# Patient Record
Sex: Female | Born: 1940 | Race: Black or African American | Hispanic: No | Marital: Married
Health system: Midwestern US, Community
[De-identification: ages and names within clinical notes are randomized; demographics above are authoritative.]

## PROBLEM LIST (undated history)

## (undated) DIAGNOSIS — F32A Depression, unspecified: Secondary | ICD-10-CM

## (undated) DIAGNOSIS — E119 Type 2 diabetes mellitus without complications: Secondary | ICD-10-CM

## (undated) DIAGNOSIS — E079 Disorder of thyroid, unspecified: Secondary | ICD-10-CM

## (undated) DIAGNOSIS — D649 Anemia, unspecified: Secondary | ICD-10-CM

## (undated) DIAGNOSIS — F419 Anxiety disorder, unspecified: Secondary | ICD-10-CM

## (undated) DIAGNOSIS — T7840XA Allergy, unspecified, initial encounter: Secondary | ICD-10-CM

## (undated) DIAGNOSIS — M109 Gout, unspecified: Secondary | ICD-10-CM

## (undated) DIAGNOSIS — I10 Essential (primary) hypertension: Secondary | ICD-10-CM

## (undated) DIAGNOSIS — K219 Gastro-esophageal reflux disease without esophagitis: Secondary | ICD-10-CM

## (undated) DIAGNOSIS — M1990 Unspecified osteoarthritis, unspecified site: Secondary | ICD-10-CM

## (undated) DIAGNOSIS — IMO0001 Reserved for inherently not codable concepts without codable children: Secondary | ICD-10-CM

## (undated) DIAGNOSIS — F411 Generalized anxiety disorder: Secondary | ICD-10-CM

## (undated) DIAGNOSIS — E1165 Type 2 diabetes mellitus with hyperglycemia: Secondary | ICD-10-CM

## (undated) HISTORY — DX: Allergy, unspecified, initial encounter: T78.40XA

## (undated) HISTORY — DX: Gastro-esophageal reflux disease without esophagitis: K21.9

## (undated) HISTORY — PX: TUBAL LIGATION: SHX77

## (undated) HISTORY — DX: Anxiety disorder, unspecified: F41.9

## (undated) HISTORY — DX: Depression, unspecified: F32.A

## (undated) HISTORY — DX: Type 2 diabetes mellitus without complications: E11.9

## (undated) HISTORY — PX: BREAST EXCISIONAL BIOPSY: SUR124

## (undated) HISTORY — PX: HERNIA REPAIR: SHX51

## (undated) HISTORY — DX: Anemia, unspecified: D64.9

## (undated) HISTORY — DX: Unspecified osteoarthritis, unspecified site: M19.90

## (undated) HISTORY — PX: ABDOMINAL HYSTERECTOMY: SHX81

## (undated) HISTORY — PX: BREAST SURGERY: SHX581

## (undated) HISTORY — DX: Disorder of thyroid, unspecified: E07.9

## (undated) HISTORY — DX: Essential (primary) hypertension: I10

---

## 2008-10-23 DIAGNOSIS — R0789 Other chest pain: Secondary | ICD-10-CM | POA: Insufficient documentation

## 2009-05-18 LAB — METABOLIC PANEL, COMPREHENSIVE
A-G Ratio: 0.8 — ABNORMAL LOW (ref 1.1–2.2)
ALT (SGPT): 25 U/L (ref 12–78)
AST (SGOT): 26 U/L (ref 15–37)
Albumin: 3.5 g/dL (ref 3.5–5.0)
Alk. phosphatase: 56 U/L (ref 50–136)
Anion gap: 11 mmol/L (ref 5–15)
BUN/Creatinine ratio: 12 (ref 12–20)
BUN: 13 MG/DL (ref 6–20)
Bilirubin, total: 0.4 MG/DL (ref 0.2–1.0)
CO2: 25 MMOL/L (ref 21–32)
Calcium: 8.9 MG/DL (ref 8.5–10.1)
Chloride: 101 MMOL/L (ref 97–108)
Creatinine: 1.1 MG/DL (ref 0.6–1.3)
GFR est AA: >60 mL/min/{1.73_m2} (ref >60)
GFR est non-AA: 53 mL/min/{1.73_m2} — ABNORMAL LOW (ref >60)
Globulin: 4.2 g/dL — ABNORMAL HIGH (ref 2.0–4.0)
Glucose: 125 MG/DL — ABNORMAL HIGH (ref 65–100)
Potassium: 3.5 MMOL/L (ref 3.5–5.1)
Protein, total: 7.7 g/dL (ref 6.4–8.2)
Sodium: 137 MMOL/L (ref 136–145)

## 2009-05-18 LAB — URINALYSIS W/ REFLEX CULTURE
Bacteria: NEGATIVE /HPF
Bilirubin: NEGATIVE
Blood: NEGATIVE
Glucose: NEGATIVE MG/DL
Ketone: NEGATIVE MG/DL
Nitrites: NEGATIVE
Protein: NEGATIVE MG/DL
Specific gravity: 1.018 (ref 1.003–1.030)
Urobilinogen: 0.2 EU/DL (ref 0.2–1.0)
pH (UA): 6.5 (ref 5.0–8.0)

## 2009-05-18 LAB — PROTHROMBIN TIME + INR
INR: 1 (ref 0.9–1.1)
Prothrombin time: 10.6 SECS (ref 9.0–11.0)

## 2009-05-18 LAB — PTT: aPTT: 46.6 s — ABNORMAL HIGH (ref 24.0–33.0)

## 2009-05-18 LAB — CBC WITH AUTOMATED DIFF
ABS. BASOPHILS: 0 10*3/uL (ref 0.0–0.1)
ABS. EOSINOPHILS: 0.1 10*3/uL (ref 0.0–0.4)
ABS. LYMPHOCYTES: 2 10*3/uL (ref 0.8–3.5)
ABS. MONOCYTES: 0.3 10*3/uL (ref 0.0–1.0)
ABS. NEUTROPHILS: 1.6 10*3/uL — ABNORMAL LOW (ref 1.8–8.0)
BASOPHILS: 0 % (ref 0–1)
EOSINOPHILS: 3 % (ref 0–7)
HCT: 42.1 % (ref 35.0–47.0)
HGB: 14 g/dL (ref 11.5–16.0)
LYMPHOCYTES: 50 % — ABNORMAL HIGH (ref 12–49)
MCH: 24.9 PG — ABNORMAL LOW (ref 26.0–34.0)
MCHC: 33.3 g/dL (ref 30.0–36.5)
MCV: 74.8 FL — ABNORMAL LOW (ref 80.0–99.0)
MONOCYTES: 7 % (ref 5–13)
NEUTROPHILS: 40 % (ref 32–75)
PLATELET: 263 10*3/uL (ref 150–400)
RBC: 5.63 M/uL — ABNORMAL HIGH (ref 3.80–5.20)
RDW: 15.9 % — ABNORMAL HIGH (ref 11.5–14.5)
WBC: 4 10*3/uL (ref 3.6–11.0)

## 2009-05-18 LAB — CK W/ REFLX CKMB: CK: 202 U/L (ref 21–215)

## 2009-05-18 LAB — TROPONIN I: Troponin-I, Qt.: <0.04 ng/mL (ref <0.05)

## 2009-05-19 LAB — CULTURE, URINE
Colonies Counted: <1000
Colony Count: <1000
Culture result:: NO GROWTH
Culture: NO GROWTH

## 2013-04-03 ENCOUNTER — Encounter

## 2020-03-05 ENCOUNTER — Ambulatory Visit: Admit: 2020-03-05 | Discharge: 2020-03-05 | Payer: MEDICARE | Attending: Family | Primary: Family

## 2020-03-05 ENCOUNTER — Ambulatory Visit: Attending: Family | Primary: Family

## 2020-03-05 DIAGNOSIS — Z0000 Encounter for general adult medical examination without abnormal findings: Secondary | ICD-10-CM

## 2020-03-05 MED ORDER — OMEPRAZOLE 20 MG CAP, DELAYED RELEASE
20 mg | ORAL_CAPSULE | Freq: Every day | ORAL | 1 refills | Status: DC
Start: 2020-03-05 — End: 2020-03-09

## 2020-03-05 MED ORDER — CLONIDINE 0.2 MG TAB
0.2 mg | ORAL_TABLET | Freq: Two times a day (BID) | ORAL | 1 refills | Status: DC
Start: 2020-03-05 — End: 2020-03-09

## 2020-03-05 MED ORDER — GLIPIZIDE 5 MG TAB
5 mg | ORAL_TABLET | Freq: Two times a day (BID) | ORAL | 1 refills | Status: DC
Start: 2020-03-05 — End: 2020-03-09

## 2020-03-05 NOTE — Addendum Note (Signed)
Addended by: Angelita Ingles on: 03/24/2020 04:55 PM     Modules accepted: Orders

## 2020-03-05 NOTE — Progress Notes (Addendum)
Diana Shaw is a 78 y.o. female    Chief Complaint   Patient presents with   ??? New Patient   ??? Establish Care   ??? Medication Refill     1. Have you been to the ER, urgent care clinic since your last visit?  Hospitalized since your last visit?No    2. Have you seen or consulted any other health care providers outside of the [facility] since your last visit?  Include any pap smears or colon screening. No

## 2020-03-05 NOTE — Patient Instructions (Signed)
Medicare Wellness Visit, Female     The best way to live healthy is to have a lifestyle where you eat a well-balanced diet, exercise regularly, limit alcohol use, and quit all forms of tobacco/nicotine, if applicable.     Regular preventive services are another way to keep healthy. Preventive services (vaccines, screening tests, monitoring & exams) can help personalize your care plan, which helps you manage your own care. Screening tests can find health problems at the earliest stages, when they are easiest to treat.   [facility] follows the current, evidence-based guidelines published by the [place] [employer] (USPSTF) when recommending preventive services for our patients. Because we follow these guidelines, sometimes recommendations change over time as research supports it. (For example, mammograms used to be recommended annually. Even though Medicare will still pay for an annual mammogram, the newer guidelines recommend a mammogram every two years for women of average risk).  Of course, you and your doctor may decide to screen more often for some diseases, based on your risk and your co-morbidities (chronic disease you are already diagnosed with).     Preventive services for you include:  - Medicare offers their members a free annual wellness visit, which is time for you and your primary care provider to discuss and plan for your preventive service needs. Take advantage of this benefit every year!  -All adults over the age of 58 should receive the recommended pneumonia vaccines. Current USPSTF guidelines recommend a series of two vaccines for the best pneumonia protection.   -All adults should have a flu vaccine yearly and a tetanus vaccine every 10 years.   -All adults age 28 and older should receive the shingles vaccines (series of two vaccines).      -All adults age 55-70 who are overweight should have a diabetes screening test once every three years.   -All  adults born between [birth year] and 1965 should be screened once for Hepatitis C.  -Other screening tests and preventive services for persons with diabetes include: an eye exam to screen for diabetic retinopathy, a kidney function test, a foot exam, and stricter control over your cholesterol.   -Cardiovascular screening for adults with routine risk involves an electrocardiogram (ECG) at intervals determined by your doctor.   -Colorectal cancer screenings should be done for adults age 48-75 with no increased risk factors for colorectal cancer.  There are a number of acceptable methods of screening for this type of cancer. Each test has its own benefits and drawbacks. Discuss with your doctor what is most appropriate for you during your annual wellness visit. The different tests include: colonoscopy (considered the best screening method), a fecal occult blood test, a fecal DNA test, and sigmoidoscopy.    -A bone mass density test is recommended when a woman turns 65 to screen for osteoporosis. This test is only recommended one time, as a screening. Some providers will use this same test as a disease monitoring tool if you already have osteoporosis.  -Breast cancer screenings are recommended every other year for women of normal risk, age 22-74.  -Cervical cancer screenings for women over age 42 are only recommended with certain risk factors.     Here is a list of your current Health Maintenance items (your personalized list of preventive services) with a due date:  Health Maintenance Due   Topic Date Due   ??? Hepatitis C Test  Never done   ??? COVID-19 Vaccine (1) Never done   ???  DTaP/Tdap/Td  (1 - Tdap) Never done   ??? Shingles Vaccine (1 of 2) Never done   ??? Bone Mineral Density   Never done   ??? Pneumococcal Vaccine (1 of 1 - PPSV23) Never done

## 2020-03-05 NOTE — Addendum Note (Signed)
Addendum Note by Angelita Ingles, NP at 03/05/20 1030                Author: Angelita Ingles, NP  Service: --  Author Type: Nurse Practitioner       Filed: 03/24/20 1655  Encounter Date: 03/05/2020  Status: Signed          Editor: Angelita Ingles, NP (Nurse Practitioner)          Addended by: Angelita Ingles on: 03/24/2020 04:55 PM    Modules accepted: Orders

## 2020-03-05 NOTE — Addendum Note (Signed)
Addendum Note  by Marianna Fuss at 03/05/20 1030                Author: Marianna Fuss  Service: --  Author Type: Technician       Filed: 03/19/20 1339  Encounter Date: 03/05/2020  Status: Signed          Editor: Marianna Fuss (Technician)          Addended by: Marianna Fuss on: 03/19/2020 01:39 PM    Modules accepted: Orders

## 2020-03-05 NOTE — Progress Notes (Signed)
This is the Subsequent Medicare Annual Wellness Exam, performed 12 months or more after the Initial AWV or the last Subsequent AWV    I have reviewed the patient's medical history in detail and updated the computerized patient record.                Depression Risk Factor Screening     3 most recent PHQ Screens 03/05/2020   Little interest or pleasure in doing things Not at all   Feeling down, depressed, irritable, or hopeless Not at all   Total Score PHQ 2 0       Alcohol Risk Screen    Do you average more than 1 drink per night or more than 7 drinks a week:  No    On any one occasion in the past three months have you have had more than 3 drinks containing alcohol:  No        Functional Ability and Level of Safety    Hearing: Hearing is good.      Activities of Daily Living:  The home contains: handrails  Patient does total self care      Ambulation: with no difficulty     Fall Risk:  Fall Risk Assessment, last 12 mths 03/05/2020   Able to walk? Yes   Fall in past 12 months? 0   Do you feel unsteady? 0   Are you worried about falling 0      Abuse Screen:  Patient is not abused       Cognitive Screening    Has your family/caregiver stated any concerns about your memory: no     Cognitive Screening: normal            Patient Care Team   Patient Care Team:  Angelica Pou, NP as PCP - General (Nurse Practitioner)  Angelica Pou, NP as PCP - [facility] Empaneled Provider    History   There is no problem list on file for this patient.    Past Medical History:   Diagnosis Date   ??? IUD (intrauterine device) in place       History reviewed. No pertinent surgical history.  Current Outpatient Medications   Medication Sig Dispense Refill   ??? pioglitazone (ACTOS) 30 mg tablet TAKE 1 TABLET BY MOUTH EVERY DAY WITH THE OTHER SUGAR LOWERING DRUGS     ??? valsartan-hydroCHLOROthiazide (DIOVAN-HCT) 160-12.5 mg per tablet TAKE 1 TABLET BY MOUTH EVERY DAY     ??? Januvia 100 mg tablet TAKE 1 TABLET BY MOUTH ONCE A DAY TO LOWER A1C     ???  LORazepam (ATIVAN) 0.5 mg tablet TAKE 1 TABLET BY MOUTH TWICE A DAY AS NEEDED FOR UNBRIDLED ANXIETY     ??? loratadine (CLARITIN) 10 mg tablet Take 10 mg by mouth daily.     ??? dicyclomine (BENTYL) 20 mg tablet Take 20 mg by mouth every six (6) hours.     ??? cloNIDine HCL (CATAPRES) 0.2 mg tablet Take 1 Tab by mouth two (2) times a day. 180 Tab 1   ??? omeprazole (PRILOSEC) 20 mg capsule Take 1 Cap by mouth daily. 90 Cap 1   ??? glipiZIDE (GLUCOTROL) 5 mg tablet Take 1 Tab by mouth two (2) times daily (with meals). 180 Tab 1     Allergies   Allergen Reactions   ??? Aspirin Palpitations   ??? Morphine Other (comments)     Headache    ??? Nitroglycerin Other (comments)     Headache   ???  Penicillins Swelling       History reviewed. No pertinent family history.  Social History     Tobacco Use   ??? Smoking status: Former Smoker   ??? Smokeless tobacco: Never Used   Substance Use Topics   ??? Alcohol use: Not Currently     Assessment/Plan       1. Medicare annual wellness visit, subsequent  Education and counseling provided:  Are appropriate based on today's review and evaluation  End-of-Life planning (with patient's consent)  Screening Mammography  Bone mass measurement (DEXA)    2. Advanced directives, counseling/discussion  See acp note    3. Encounter for screening mammogram for malignant neoplasm of breast  -     MAM MAMMO BI SCREENING INCL CAD; Future    4. Postmenopausal state  -     DEXA BONE DENSITY STUDY AXIAL; Future      Patient believes health maintenance is up to date- will obtain records [facility] for review to confirm this.    Health Maintenance Due     Health Maintenance Due   Topic Date Due   ??? Hepatitis C Screening  Never done   ??? COVID-19 Vaccine (1) Never done   ??? DTaP/Tdap/Td series (1 - Tdap) Never done   ??? Shingrix Vaccine Age 28> (1 of 2) Never done   ??? Bone Densitometry (Dexa) Screening  Never done   ??? Pneumococcal 65+ years (1 of 1 - PPSV23) Never done     Follow up: next AWV recommended in 12 months.     I have  discussed the diagnosis with the patient and the intended plan as seen in the above orders. The patient has received an after-visit summary and questions were answered concerning future plans. Patient conveyed understanding of the plan at the time of the visit.    Angelita Ingles, NP

## 2020-03-05 NOTE — Progress Notes (Signed)
RBC count is elevated with small cell size and increased variability in cell size. Comparison value on file from 10 years ago is similar. Have you seen a hematologist for this in the past?  Thyroid level is normal.  Electrolytes, liver and kidney function normal with the exception of high blood sugar.   LDL cholesterol is well above goal for patients with diabetes, needs to be less than 70, yours is 179. The 10-year ASCVD risk score Denman George DC Jr., et al., 2013) is: 41.7%. this represents very high risk of having a major cardiovascular event such as a stroke or heart attack in the next 10 years. Current guidelines recommend starting cholesterol-lowering medication; medications in the statin family have been shown to be most effective in lowering the risk. Recommend starting atorvastatin 40 mg daily if you are in agreement.   A1c is 11.0- this represents very poor control of blood sugar over the past 3 months. You have reported to my nurse today that you have had a headache with the increase in glipizide dose I recommended. You are not currently on metformin- have you taken this in the past? If not i'd like to add this to your current medications. If you have not been able to tolerate extended release metformin in the past, there is one more medication (an injectable medication daily or once weekly depending on what is covered by your insurance) we can add before moving to insulin.I'd like you to meet with the diabetes educator for self-management education. Until then, please eliminate sweetened beverages, minimize sweets and junk food, and limit simple carbs to one serving at each meal.   Check your blood sugar every morning before breakfast and bring log to appts. Make a follow up appt in 4 weeks. If we do not see some improvement on home glucose monitoring we will have to take further steps to get better control.

## 2020-03-05 NOTE — Progress Notes (Signed)
Subjective:     Diana Shaw is a 79 y.o. female seen to establish care and for evaluation of diabetes, htn, GERD, and anxiety.     Diabetic Review of Systems - medication compliance: compliant all of the time, diabetic diet compliance: compliant most of the time, home glucose monitoring: is performed sporadically, no log today but reports most recent checks have been above 200, further diabetic ROS: no polyuria or polydipsia, no chest pain, dyspnea or TIA's, no numbness, tingling or pain in extremities, no unusual visual symptoms, no hypoglycemia, no medication side effects noted, reports eye exam up to date, acute symptoms are none. Reports A1C was well above goal at last check a little over 3 months ago, has been poorly controlled for over a year.     Diet and Lifestyle: generally follows a low fat low cholesterol diet, generally follows a low sodium diet, exercises sporadically, nonsmoker  Home BP Monitoring: is not measured at home    Cardiovascular ROS: taking medications as instructed, no medication side effects noted, no TIA's, no chest pain on exertion, no dyspnea on exertion, no swelling of ankles, no orthostatic dizziness or lightheadedness, no orthopnea or paroxysmal nocturnal dyspnea, no palpitations, no intermittent claudication symptoms.     Other symptoms and concerns: reports history of intermittent anxiety, with flares a few times a month. Uses lorazepam as needed for this.    Reports reflux symptoms well-controlled on current dose omeprazole, good compliance, tolerating well without apparent side effects.     There is no problem list on file for this patient.    Allergies   Allergen Reactions   ??? Aspirin Palpitations   ??? Morphine Other (comments)     Headache    ??? Nitroglycerin Other (comments)     Headache   ??? Penicillins Swelling     Past Medical History:   Diagnosis Date   ??? IUD (intrauterine device) in place      History reviewed. No pertinent surgical history.  History reviewed. No  pertinent family history.  Social History     Tobacco Use   ??? Smoking status: Former Smoker   ??? Smokeless tobacco: Never Used   Substance Use Topics   ??? Alcohol use: Not Currently        Lab Results   Component Value Date/Time    WBC 4.0 05/18/2009 08:15 AM    HGB 14.0 05/18/2009 08:15 AM    HCT 42.1 05/18/2009 08:15 AM    PLATELET 263 05/18/2009 08:15 AM    MCV 74.8 (L) 05/18/2009 08:15 AM     Lab Results   Component Value Date/Time    Glucose 125 (H) 05/18/2009 09:11 AM    Creatinine 1.1 05/18/2009 09:11 AM      No results found for: CHOL, CHOLPOCT, HDL, LDL, LDLC, LDLCPOC, LDLCEXT, TRIGL, TGLPOCT, CHHD, CHHDX  Lab Results   Component Value Date/Time    ALT (SGPT) 25 05/18/2009 09:11 AM    Alk. phosphatase 56 05/18/2009 09:11 AM    Bilirubin, total 0.4 05/18/2009 09:11 AM    Albumin 3.5 05/18/2009 09:11 AM    Protein, total 7.7 05/18/2009 09:11 AM    INR 1.0 05/18/2009 09:11 AM    Prothrombin time 10.6 05/18/2009 09:11 AM    PLATELET 263 05/18/2009 08:15 AM     Lab Results   Component Value Date/Time    GFR est non-AA 53 (L) 05/18/2009 09:11 AM    GFR est AA >60 05/18/2009 09:11 AM    Creatinine 1.1  05/18/2009 09:11 AM    BUN 13 05/18/2009 09:11 AM    Sodium 137 05/18/2009 09:11 AM    Potassium 3.5 05/18/2009 09:11 AM    Chloride 101 05/18/2009 09:11 AM    CO2 25 05/18/2009 09:11 AM       Review of Systems  A comprehensive review of systems was negative except for that written in the HPI.    Objective:     Visit Vitals  BP 130/83 (BP 1 Location: Left upper arm, BP Patient Position: Sitting, BP Cuff Size: Adult)   Pulse 80   Temp 98.6 ??F (37 ??C) (Oral)   Resp 16   Ht 5' 5"  (1.651 m)   Wt 217 lb 3.2 oz (98.5 kg)   SpO2 99%   BMI 36.14 kg/m??     Physical Examination: General appearance - alert, well appearing, and in no distress, oriented to person, place, and time and well hydrated  Mental status - normal mood, behavior, speech, dress, motor activity, and thought processes  Eyes - pupils equal and reactive,  extraocular eye movements intact, sclera anicteric  Ears - bilateral TM's and external ear canals normal  Nose - normal and patent, no erythema, discharge or polyps  Mouth - mucous membranes moist, pharynx normal without lesions, tongue normal  Neck - supple, no significant adenopathy, thyroid exam: thyroid is normal in size without nodules or tenderness  Chest - clear to auscultation, no wheezes, rales or rhonchi, symmetric air entry  Heart - normal rate, regular rhythm, normal S1, S2, no murmurs, rubs, clicks or gallops, normal bilateral carotid upstroke without bruits, no JVD  Abdomen - soft, nontender, nondistended, no masses or organomegaly  bowel sounds normal  Neurological - alert, oriented, normal speech, no focal findings or movement disorder noted  Musculoskeletal - no joint tenderness, deformity or swelling, no muscular tenderness noted  Extremities - no pedal edema noted, intact peripheral pulses  Skin - normal coloration and turgor, no rashes, no suspicious skin lesions noted      Assessment/Plan:       Diabetic issues reviewed with her: low cholesterol diet, weight control and daily exercise discussed, home glucose monitoring emphasized, all medications, side effects and compliance discussed carefully, foot care discussed and Podiatry visits discussed, annual eye examinations at Ophthalmology discussed, glycohemoglobin and other lab monitoring discussed and long term diabetic complications discussed.   Diagnoses and all orders for this visit:    1. Type 2 diabetes mellitus with hyperglycemia, without long-term current use of insulin (HCC)  Above goal  Increase glipizide to 5 mg bid with meals.   Continue januvia  Continue pioglitazone  -     glipiZIDE (GLUCOTROL) 5 mg tablet; Take 1 Tab by mouth two (2) times daily (with meals).  -     HEMOGLOBIN A1C WITH EAG; Future  -     LIPID PANEL; Future    2. Gastroesophageal reflux disease without esophagitis  Controlled  Continue rx  -     omeprazole  (PRILOSEC) 20 mg capsule; Take 1 Cap by mouth daily.    3. Essential hypertension  at goal  Continue diovan-hct, clonidine  Low sodium diet  Weight loss  150 minutes of moderate intensity exercise weekly  -     cloNIDine HCL (CATAPRES) 0.2 mg tablet; Take 1 Tab by mouth two (2) times a day.  -     METABOLIC PANEL, COMPREHENSIVE; Future  -     CBC W/O DIFF; Future  -  TSH 3RD GENERATION; Future    4. GAD (generalized anxiety disorder)  Symptoms infrequent  Continue lorazepam prn    Follow-up and Dispositions    ?? Return in about 2 weeks (around 03/19/2020), or if symptoms worsen or fail to improve, for fasting labs.     follow up office visit in 3 months    I have discussed the diagnosis with the patient and the intended plan as seen in the above orders. The patient has received an after-visit summary and questions were answered concerning future plans. Patient conveyed understanding of the plan at the time of the visit.    Angelica Pou, NP

## 2020-03-05 NOTE — Addendum Note (Signed)
Addendum Note by Angelita Ingles, NP at 03/05/20 1030                Author: Angelita Ingles, NP  Service: --  Author Type: Nurse Practitioner       Filed: 03/23/20 2129  Encounter Date: 03/05/2020  Status: Signed          Editor: Angelita Ingles, NP (Nurse Practitioner)          Addended by: Angelita Ingles on: 03/23/2020 09:29 PM    Modules accepted: Orders

## 2020-03-05 NOTE — Progress Notes (Signed)
 Diana Shaw is a 79 y.o. female    Chief Complaint   Patient presents with   . New Patient   . Establish Care   . Medication Refill     1. Have you been to the ER, urgent care clinic since your last visit?  Hospitalized since your last visit?No    2. Have you seen or consulted any other health care providers outside of the [facility] since your last visit?  Include any pap smears or colon screening. No

## 2020-03-05 NOTE — Progress Notes (Signed)
Spoke to pt. Informed lab results.    -Pt stated that she didn't see hematologist in the past, needs referral.    -Pt agree to start atorvastatin and metformin. [facility] pharmacy on file.     -4 weeks f/up appointment schedule.

## 2020-03-05 NOTE — ACP (Advance Care Planning) (Signed)
Advance Care Planning     Advance Care Planning     Advance Care Planning (ACP) Provider Conversation Snapshot     Date of ACP Conversation: 03/05/2020  Persons included in Conversation:  patient  Length of ACP Conversation in minutes:  <16 minutes (Non-Billable)     Authorized Management consultant (if patient is incapable of making informed decisions):   This person is:   Other Engineering geologist (e.g. Next of Kin)                                                       For Patients with Decision Making Capacity:   Values/Goals: Exploration of values, goals, and preferences if recovery is not expected, even with continued medical treatment in the event of:  Imminent death  Severe, permanent brain injury  "In these circumstances, what matters most to you?"  Care focused more on comfort or quality of life.     Conversation Outcomes / Follow-Up Plan:   Recommended completion of Advance Directive form after review of ACP materials and conversation with prospective healthcare agent   Recommended communicating the plan and making copies for the healthcare agent, personal physician, and others as appropriate (e.g., health system)  Recommended review of completed ACP document annually or upon change in health status      Information and blank forms given for review and completion. Will f/u on status at next visit.    Angelita Ingles, NP

## 2020-03-05 NOTE — Progress Notes (Signed)
RBC count is elevated with small cell size and increased variability in cell size. Comparison value on file from 10 years ago is similar. Have you seen a hematologist for this in the past?  Thyroid level is normal.  Electrolytes, liver and kidney function normal with the exception of high blood sugar.   LDL cholesterol is well above goal for patients with diabetes, needs to be less than 70, yours is 179. The 10-year ASCVD risk score (Goff DC Jr., et al., 2013) is: 41.7%. this represents very high risk of having a major cardiovascular event such as a stroke or heart attack in the next 10 years. Current guidelines recommend starting cholesterol-lowering medication; medications in the statin family have been shown to be most effective in lowering the risk. Recommend starting atorvastatin 40 mg daily if you are in agreement.   A1c is 11.0- this represents very poor control of blood sugar over the past 3 months. You have reported to my nurse today that you have had a headache with the increase in glipizide dose I recommended. You are not currently on metformin- have you taken this in the past? If not i'd like to add this to your current medications. If you have not been able to tolerate extended release metformin in the past, there is one more medication (an injectable medication daily or once weekly depending on what is covered by your insurance) we can add before moving to insulin.I'd like you to meet with the diabetes educator for self-management education. Until then, please eliminate sweetened beverages, minimize sweets and junk food, and limit simple carbs to one serving at each meal.   Check your blood sugar every morning before breakfast and bring log to appts. Make a follow up appt in 4 weeks. If we do not see some improvement on home glucose monitoring we will have to take further steps to get better control.

## 2020-03-05 NOTE — Addendum Note (Signed)
Addended by: Angelita Ingles on: 03/23/2020 09:29 PM     Modules accepted: Orders

## 2020-03-05 NOTE — Addendum Note (Signed)
Addended by: Marianna Fuss on: 03/19/2020 01:39 PM     Modules accepted: Orders

## 2020-03-05 NOTE — ACP (Advance Care Planning) (Signed)
Advance Care Planning     Advance Care Planning     Advance Care Planning (ACP) Provider Conversation Snapshot     Date of ACP Conversation: 03/05/2020  Persons included in Conversation:  patient  Length of ACP Conversation in minutes:  <16 minutes (Non-Billable)     Authorized Decision Maker (if patient is incapable of making informed decisions):   This person is:   Other Legally Authorized Decision Maker (e.g. Next of Kin)                                                       For Patients with Decision Making Capacity:   Values/Goals: Exploration of values, goals, and preferences if recovery is not expected, even with continued medical treatment in the event of:  Imminent death  Severe, permanent brain injury  "In these circumstances, what matters most to you?"  Care focused more on comfort or quality of life.     Conversation Outcomes / Follow-Up Plan:   Recommended completion of Advance Directive form after review of ACP materials and conversation with prospective healthcare agent   Recommended communicating the plan and making copies for the healthcare agent, personal physician, and others as appropriate (e.g., health system)  Recommended review of completed ACP document annually or upon change in health status      Information and blank forms given for review and completion. Will f/u on status at next visit.    Gwendolyn Mclees Q [place] Urieta, NP

## 2020-03-05 NOTE — Progress Notes (Signed)
This is the Subsequent Medicare Annual Wellness Exam, performed 12 months or more after the Initial AWV or the last Subsequent AWV    I have reviewed the patient's medical history in detail and updated the computerized patient record.                Depression Risk Factor Screening     3 most recent PHQ Screens 03/05/2020   Little interest or pleasure in doing things Not at all   Feeling down, depressed, irritable, or hopeless Not at all   Total Score PHQ 2 0       Alcohol Risk Screen    Do you average more than 1 drink per night or more than 7 drinks a week:  No    On any one occasion in the past three months have you have had more than 3 drinks containing alcohol:  No        Functional Ability and Level of Safety    Hearing: Hearing is good.      Activities of Daily Living:  The home contains: handrails  Patient does total self care      Ambulation: with no difficulty     Fall Risk:  Fall Risk Assessment, last 12 mths 03/05/2020   Able to walk? Yes   Fall in past 12 months? 0   Do you feel unsteady? 0   Are you worried about falling 0      Abuse Screen:  Patient is not abused       Cognitive Screening    Has your family/caregiver stated any concerns about your memory: no     Cognitive Screening: normal            Patient Care Team   Patient Care Team:  Shadow Stiggers Q, NP as PCP - General (Nurse Practitioner)  Cinda Hara Q, NP as PCP - [facility] Empaneled Provider    History   There is no problem list on file for this patient.    Past Medical History:   Diagnosis Date   ??? IUD (intrauterine device) in place       History reviewed. No pertinent surgical history.  Current Outpatient Medications   Medication Sig Dispense Refill   ??? pioglitazone (ACTOS) 30 mg tablet TAKE 1 TABLET BY MOUTH EVERY DAY WITH THE OTHER SUGAR LOWERING DRUGS     ??? valsartan-hydroCHLOROthiazide (DIOVAN-HCT) 160-12.5 mg per tablet TAKE 1 TABLET BY MOUTH EVERY DAY     ??? Januvia 100 mg tablet TAKE 1 TABLET BY MOUTH ONCE A DAY TO LOWER A1C     ???  LORazepam (ATIVAN) 0.5 mg tablet TAKE 1 TABLET BY MOUTH TWICE A DAY AS NEEDED FOR UNBRIDLED ANXIETY     ??? loratadine (CLARITIN) 10 mg tablet Take 10 mg by mouth daily.     ??? dicyclomine (BENTYL) 20 mg tablet Take 20 mg by mouth every six (6) hours.     ??? cloNIDine HCL (CATAPRES) 0.2 mg tablet Take 1 Tab by mouth two (2) times a day. 180 Tab 1   ??? omeprazole (PRILOSEC) 20 mg capsule Take 1 Cap by mouth daily. 90 Cap 1   ??? glipiZIDE (GLUCOTROL) 5 mg tablet Take 1 Tab by mouth two (2) times daily (with meals). 180 Tab 1     Allergies   Allergen Reactions   ??? Aspirin Palpitations   ??? Morphine Other (comments)     Headache    ??? Nitroglycerin Other (comments)     Headache   ???   Penicillins Swelling       History reviewed. No pertinent family history.  Social History     Tobacco Use   ??? Smoking status: Former Smoker   ??? Smokeless tobacco: Never Used   Substance Use Topics   ??? Alcohol use: Not Currently     Assessment/Plan       1. Medicare annual wellness visit, subsequent  Education and counseling provided:  Are appropriate based on today's review and evaluation  End-of-Life planning (with patient's consent)  Screening Mammography  Bone mass measurement (DEXA)    2. Advanced directives, counseling/discussion  See acp note    3. Encounter for screening mammogram for malignant neoplasm of breast  -     MAM MAMMO BI SCREENING INCL CAD; Future    4. Postmenopausal state  -     DEXA BONE DENSITY STUDY AXIAL; Future      Patient believes health maintenance is up to date- will obtain records from [facility] for review to confirm this.    Health Maintenance Due     Health Maintenance Due   Topic Date Due   ??? Hepatitis C Screening  Never done   ??? COVID-19 Vaccine (1) Never done   ??? DTaP/Tdap/Td series (1 - Tdap) Never done   ??? Shingrix Vaccine Age 28> (1 of 2) Never done   ??? Bone Densitometry (Dexa) Screening  Never done   ??? Pneumococcal 65+ years (1 of 1 - PPSV23) Never done     Follow up: next AWV recommended in 12 months.     I have  discussed the diagnosis with the patient and the intended plan as seen in the above orders. The patient has received an after-visit summary and questions were answered concerning future plans. Patient conveyed understanding of the plan at the time of the visit.    Angelita Ingles, NP

## 2020-03-05 NOTE — Progress Notes (Signed)
Spoke to pt. Informed lab results.    -Pt stated that she didn't see hematologist in the past, needs referral.    -Pt agree to start atorvastatin and metformin. [facility] pharmacy on file.     -4 weeks f/up appointment schedule.

## 2020-03-09 ENCOUNTER — Encounter

## 2020-03-10 MED ORDER — GLIPIZIDE 5 MG TAB
5 mg | ORAL_TABLET | Freq: Two times a day (BID) | ORAL | 1 refills | Status: DC
Start: 2020-03-10 — End: 2020-07-13

## 2020-03-10 MED ORDER — PIOGLITAZONE 30 MG TAB
30 mg | ORAL_TABLET | Freq: Every day | ORAL | 1 refills | Status: DC
Start: 2020-03-10 — End: 2020-07-13

## 2020-03-10 MED ORDER — JANUVIA 100 MG TABLET
100 mg | ORAL_TABLET | Freq: Every day | ORAL | 1 refills | Status: DC
Start: 2020-03-10 — End: 2020-07-13

## 2020-03-10 MED ORDER — DICYCLOMINE 20 MG TAB
20 mg | ORAL_TABLET | Freq: Four times a day (QID) | ORAL | 1 refills | Status: AC
Start: 2020-03-10 — End: ?

## 2020-03-10 MED ORDER — LORATADINE 10 MG TAB
10 mg | ORAL_TABLET | Freq: Every day | ORAL | 1 refills | Status: AC
Start: 2020-03-10 — End: ?

## 2020-03-10 MED ORDER — CLONIDINE 0.2 MG TAB
0.2 mg | ORAL_TABLET | Freq: Two times a day (BID) | ORAL | 1 refills | Status: DC
Start: 2020-03-10 — End: 2020-08-07

## 2020-03-10 MED ORDER — OMEPRAZOLE 20 MG CAP, DELAYED RELEASE
20 mg | ORAL_CAPSULE | Freq: Every day | ORAL | 1 refills | Status: DC
Start: 2020-03-10 — End: 2020-07-24

## 2020-03-15 ENCOUNTER — Encounter

## 2020-03-16 MED ORDER — LORAZEPAM 0.5 MG TAB
0.5 mg | ORAL_TABLET | ORAL | 1 refills | Status: AC
Start: 2020-03-16 — End: ?

## 2020-03-20 LAB — METABOLIC PANEL, COMPREHENSIVE
A-G Ratio: 1.1 (ref 1.1–2.2)
ALT (SGPT): 18 U/L (ref 12–78)
AST (SGOT): 13 U/L — ABNORMAL LOW (ref 15–37)
Albumin: 3.6 g/dL (ref 3.5–5.0)
Alk. phosphatase: 53 U/L (ref 45–117)
Anion gap: 5 mmol/L (ref 5–15)
BUN/Creatinine ratio: 19 (ref 12–20)
BUN: 14 MG/DL (ref 6–20)
Bilirubin, total: 0.5 MG/DL (ref 0.2–1.0)
CO2: 30 mmol/L (ref 21–32)
Calcium: 9.2 MG/DL (ref 8.5–10.1)
Chloride: 102 mmol/L (ref 97–108)
Creatinine: 0.72 MG/DL (ref 0.55–1.02)
GFR est AA: >60 mL/min/{1.73_m2} (ref >60)
GFR est non-AA: >60 mL/min/{1.73_m2} (ref >60)
Globulin: 3.3 g/dL (ref 2.0–4.0)
Glucose: 188 mg/dL — ABNORMAL HIGH (ref 65–100)
Potassium: 3.8 mmol/L (ref 3.5–5.1)
Protein, total: 6.9 g/dL (ref 6.4–8.2)
Sodium: 137 mmol/L (ref 136–145)

## 2020-03-20 LAB — CBC W/O DIFF
ABSOLUTE NRBC: 0 10*3/uL (ref 0.00–0.01)
HCT: 43.3 % (ref 35.0–47.0)
HGB: 13 g/dL (ref 11.5–16.0)
MCH: 23.5 PG — ABNORMAL LOW (ref 26.0–34.0)
MCHC: 30 g/dL (ref 30.0–36.5)
MCV: 78.3 FL — ABNORMAL LOW (ref 80.0–99.0)
MPV: 11.3 FL (ref 8.9–12.9)
NRBC: 0 PER 100 WBC
PLATELET: 200 10*3/uL (ref 150–400)
RBC: 5.53 M/uL — ABNORMAL HIGH (ref 3.80–5.20)
RDW: 16.9 % — ABNORMAL HIGH (ref 11.5–14.5)
WBC: 4.9 10*3/uL (ref 3.6–11.0)

## 2020-03-20 LAB — HEMOGLOBIN A1C WITH EAG
Est. average glucose: 269 mg/dL
Hemoglobin A1c: 11 % — ABNORMAL HIGH (ref 4.0–5.6)

## 2020-03-20 LAB — LIPID PANEL
CHOL/HDL Ratio: 5.8 — ABNORMAL HIGH (ref 0.0–5.0)
Chol/HDL Ratio: 5.8 — ABNORMAL HIGH (ref 0.0–5.0)
Cholesterol, Total: 261 MG/DL — ABNORMAL HIGH (ref <200)
Cholesterol, total: 261 MG/DL — ABNORMAL HIGH (ref <200)
HDL Cholesterol: 45 MG/DL
HDL: 45 MG/DL
LDL Calculated: 179 MG/DL — ABNORMAL HIGH (ref 0–100)
LDL, calculated: 179 MG/DL — ABNORMAL HIGH (ref 0–100)
Triglyceride: 185 MG/DL — ABNORMAL HIGH (ref <150)
Triglycerides: 185 MG/DL — ABNORMAL HIGH (ref <150)
VLDL Cholesterol Calculated: 37 MG/DL
VLDL, calculated: 37 MG/DL

## 2020-03-20 LAB — TSH 3RD GENERATION
TSH: 1.41 u[IU]/mL (ref 0.36–3.74)
TSH: 1.41 u[IU]/mL (ref 0.36–3.74)

## 2020-03-20 LAB — COMPREHENSIVE METABOLIC PANEL
ALT: 18 U/L (ref 12–78)
AST: 13 U/L — ABNORMAL LOW (ref 15–37)
Albumin/Globulin Ratio: 1.1 (ref 1.1–2.2)
Albumin: 3.6 g/dL (ref 3.5–5.0)
Alkaline Phosphatase: 53 U/L (ref 45–117)
Anion Gap: 5 mmol/L (ref 5–15)
BUN: 14 MG/DL (ref 6–20)
Bun/Cre Ratio: 19 (ref 12–20)
CO2: 30 mmol/L (ref 21–32)
Calcium: 9.2 MG/DL (ref 8.5–10.1)
Chloride: 102 mmol/L (ref 97–108)
Creatinine: 0.72 MG/DL (ref 0.55–1.02)
EGFR IF NonAfrican American: >60 mL/min/{1.73_m2} (ref >60)
GFR African American: >60 mL/min/{1.73_m2} (ref >60)
Globulin: 3.3 g/dL (ref 2.0–4.0)
Glucose: 188 mg/dL — ABNORMAL HIGH (ref 65–100)
Potassium: 3.8 mmol/L (ref 3.5–5.1)
Sodium: 137 mmol/L (ref 136–145)
Total Bilirubin: 0.5 MG/DL (ref 0.2–1.0)
Total Protein: 6.9 g/dL (ref 6.4–8.2)

## 2020-03-20 LAB — CBC
Hematocrit: 43.3 % (ref 35.0–47.0)
Hemoglobin: 13 g/dL (ref 11.5–16.0)
MCH: 23.5 PG — ABNORMAL LOW (ref 26.0–34.0)
MCHC: 30 g/dL (ref 30.0–36.5)
MCV: 78.3 FL — ABNORMAL LOW (ref 80.0–99.0)
MPV: 11.3 FL (ref 8.9–12.9)
NRBC Absolute: 0 10*3/uL (ref 0.00–0.01)
Nucleated RBCs: 0 PER 100 WBC
Platelets: 200 10*3/uL (ref 150–400)
RBC: 5.53 M/uL — ABNORMAL HIGH (ref 3.80–5.20)
RDW: 16.9 % — ABNORMAL HIGH (ref 11.5–14.5)
WBC: 4.9 10*3/uL (ref 3.6–11.0)

## 2020-03-20 LAB — HEMOGLOBIN A1C W/EAG
Hemoglobin A1C: 11 % — ABNORMAL HIGH (ref 4.0–5.6)
eAG: 269 mg/dL

## 2020-03-23 ENCOUNTER — Encounter

## 2020-03-23 NOTE — Telephone Encounter (Signed)
Did she happen to check her blood sugar when she had the severe headache?

## 2020-03-23 NOTE — Telephone Encounter (Signed)
Spoke to pt. Pt stated that she didn't check her BS

## 2020-03-23 NOTE — Telephone Encounter (Signed)
Spoke to pt. Pt stated that she is having severe headache when she takes glipizide twice a day.  Pt stated that she is only taking once daily now.

## 2020-03-23 NOTE — Telephone Encounter (Signed)
Patient would like to discuss side effects from Glipizide. Patient's phone: [phone]

## 2020-03-23 NOTE — Telephone Encounter (Signed)
Did she happen to check her blood sugar when she had the severe headache?

## 2020-03-23 NOTE — Telephone Encounter (Signed)
I already approved this rx last week, please call pharmacy and find out why it is being requested again.  If the request is due to quantity not being 90, I intentionally put quantity of 60 as I do not want the patient to take the medication every day, it is for occasional use.       Providers    Authorizing Provider: Angelita Ingles, NP NPI: [id number]   DEA #: [id number]   Ordering User:  Angelita Ingles, NP        Pharmacy    [facility] - [place], [place] - [address]   [address] [place] [address]   Phone:  [phone]  Fax:  [phone]   DEA #:  --   Outpatient Medication Detail     Disp Refills Start End    LORazepam (ATIVAN) 0.5 mg tablet 60 Tablet 1 03/16/2020     Sig: TAKE 1 TABLET BY MOUTH TWICE A DAY AS NEEDED FOR UNBRIDLED ANXIETY    Sent to pharmacy as: LORazepam 0.5 mg tablet (ATIVAN)    E-Prescribing Status: Receipt confirmed by pharmacy (03/16/2020  2:41 PM EDT)

## 2020-03-23 NOTE — Telephone Encounter (Signed)
I already approved this rx last week, please call pharmacy and find out why it is being requested again.  If the request is due to quantity not being 90, I intentionally put quantity of 60 as I do not want the patient to take the medication every day, it is for occasional use.       Providers    Authorizing Provider: Eastyn Dattilo Q, NP NPI: [id number]   DEA #: [id number]   Ordering User:  Zniyah Midkiff Q, NP        Pharmacy    [facility] - [place], [place] - [address]   [address], [place] [address]   Phone:  [phone]  Fax:  [phone]   DEA #:  --   Outpatient Medication Detail     Disp Refills Start End    LORazepam (ATIVAN) 0.5 mg tablet 60 Tablet 1 03/16/2020     Sig: TAKE 1 TABLET BY MOUTH TWICE A DAY AS NEEDED FOR UNBRIDLED ANXIETY    Sent to pharmacy as: LORazepam 0.5 mg tablet (ATIVAN)    E-Prescribing Status: Receipt confirmed by pharmacy (03/16/2020  2:41 PM EDT)

## 2020-03-23 NOTE — Telephone Encounter (Signed)
Patient would like to discuss side effects from Glipizide. Patient's phone: [phone]

## 2020-03-23 NOTE — Telephone Encounter (Signed)
Spoke to pt. Pt stated that she didn't check her BS

## 2020-03-23 NOTE — Telephone Encounter (Signed)
Spoke to pt. Pt stated that she is having severe headache when she takes glipizide twice a day.  Pt stated that she is only taking once daily now.

## 2020-03-24 MED ORDER — METFORMIN SR 500 MG 24 HR TABLET
500 mg | ORAL_TABLET | ORAL | 0 refills | Status: AC
Start: 2020-03-24 — End: 2020-06-22

## 2020-03-24 MED ORDER — ATORVASTATIN 40 MG TAB
40 mg | ORAL_TABLET | Freq: Every day | ORAL | 0 refills | Status: DC
Start: 2020-03-24 — End: 2020-05-19

## 2020-03-24 NOTE — Telephone Encounter (Signed)
Spoke to Claudia ( [facility] pharmacy rep). She stated that it's by mistake by other rep and she will put note in it.     Nothing needs to be done from our end.

## 2020-03-24 NOTE — Telephone Encounter (Signed)
See result note 03/23/2020 for further instructions

## 2020-03-24 NOTE — Telephone Encounter (Signed)
Spoke to [employer] [facility] pharmacy rep). She stated that it's by mistake by other rep and she will put note in it.     Nothing needs to be done from our end.

## 2020-03-24 NOTE — Telephone Encounter (Signed)
See result note 03/23/2020 for further instructions

## 2020-04-01 ENCOUNTER — Ambulatory Visit: Payer: MEDICARE | Primary: Family

## 2020-04-09 NOTE — Telephone Encounter (Signed)
Tange the NP from [facility] calling. She states that she had a visit with pt today and she was complaining of some side effects of the medication Metformin. Pt has appt on 6/24 in our office and she would like to speak to the nurse to give this information prior to appt. She can be reached at [phone].

## 2020-04-09 NOTE — Telephone Encounter (Signed)
Spoke to [place] NP. Pt told her that she is taking metformin and glipizide once a day due to symptoms. (Cramping, Headache and body ache). Pt also stopped taking Actos because she is gaining weight.

## 2020-04-09 NOTE — Telephone Encounter (Signed)
Will address at upcoming appt 6/24

## 2020-04-13 NOTE — Telephone Encounter (Signed)
Concur with recommendation for eval in ED

## 2020-04-13 NOTE — Telephone Encounter (Signed)
Patient called stating she has chest pain and left arm pain with left hand numbness. She states she just started on metformin and feels that this is related. No cardiac history, but positive for anxiety. Patient states her chest pain started last night as " cramping" and now its intermittent " shooting " pain. Encouraged to go to ED or call 911 to get evaluated. She states she has an appt with G. Tassell on Thursday and I recommended her not to wait until then. If she goes to ED and they can rule out cardio, Ms. Tassell will then know how to treat her sx better, if it is from medication or anxiety. She finally agreed with plan to go to ED, however when asked which ED she will be going to , she states she did not know.

## 2020-04-15 ENCOUNTER — Ambulatory Visit: Admit: 2020-04-15 | Discharge: 2020-04-15 | Payer: MEDICARE | Attending: Family | Primary: Family

## 2020-04-15 ENCOUNTER — Ambulatory Visit: Attending: Family | Primary: Family

## 2020-04-15 DIAGNOSIS — E1165 Type 2 diabetes mellitus with hyperglycemia: Secondary | ICD-10-CM

## 2020-04-15 LAB — MICROALBUMIN, UR, RAND W/ MICROALB/CREAT RATIO
Creatinine, urine random: 190 mg/dL
Microalbumin,urine random: 7.52 MG/DL
Microalbumin/Creat ratio (mg/g creat): 40 mg/g — ABNORMAL HIGH (ref 0–30)

## 2020-04-15 LAB — MICROALBUMIN / CREATININE URINE RATIO
Creatinine, Ur: 190 mg/dL
Microalb, Ur: 7.52 MG/DL
Microalbumin Creatinine Ratio: 40 mg/g — ABNORMAL HIGH (ref 0–30)

## 2020-04-15 MED ORDER — FREESTYLE LIBRE 14 DAY SENSOR KIT
PACK | 0 refills | Status: AC
Start: 2020-04-15 — End: ?

## 2020-04-15 MED ORDER — SERTRALINE 50 MG TAB
50 mg | ORAL_TABLET | Freq: Every day | ORAL | 1 refills | Status: DC
Start: 2020-04-15 — End: 2020-05-19

## 2020-04-15 MED ORDER — FREESTYLE LIBRE 14 DAY READER
0 refills | Status: AC
Start: 2020-04-15 — End: ?

## 2020-04-15 MED ORDER — SERTRALINE 50 MG TAB
50 mg | ORAL_TABLET | Freq: Every day | ORAL | 0 refills | Status: DC
Start: 2020-04-15 — End: 2020-05-07

## 2020-04-15 NOTE — Progress Notes (Signed)
Subjective:     Diana Shaw is a 79 y.o. female seen for follow up of diabetes, htn, hyperlipidemia, GERD, and anixety.    Had ED visit 2 days ago for chest pain with numbness radiating into the left arm. Described the pain as sharp and continuous. Symptoms improved a bit with at home with ativan but did not resolve. States was given IV ativan in the ED and symptoms completely resolved. Reports she had a normal stress test in the ED as well.     Recent a1c 11.0; recommend increasing metformin to 1000 mg BID and adding pioglitazone, continuing glipizide and januvia. Patient decided against these recommendations. Has not made any changes to her diet. Not monitoring blood glucose at home, states she has difficulty using lancets.   further diabetic ROS: no polyuria or polydipsia, no chest pain, dyspnea or TIA's, no unusual visual symptoms, no medication side effects noted, eye exam overdue, acute symptoms are none.    Cardiovascular risk analysis - LDL goal is under 70  diabetic  hypertension  hyperlipidemia  former smoker  obese  sedentary lifestyle.   Compliance with treatment thus far has been good.   ROS: taking medications as instructed, no medication side effects noted, no TIA's, no chest pain on exertion, no dyspnea on exertion, no swelling of ankles, no orthostatic dizziness or lightheadedness, no orthopnea or paroxysmal nocturnal dyspnea, no palpitations, no intermittent claudication symptoms.     Diet and Lifestyle: generally follows a low fat low cholesterol diet, generally follows a low sodium diet, does not rigorously follow a diabetic diet, sedentary  Home BP Monitoring: is not measured at home    Cardiovascular ROS: taking medications as instructed, no medication side effects noted, no TIA's, no chest pain on exertion, no dyspnea on exertion, no swelling of ankles, no orthostatic dizziness or lightheadedness, no orthopnea or paroxysmal nocturnal dyspnea, no palpitations, no intermittent  claudication symptoms.     Other symptoms and concerns: none.    Patient Active Problem List   Diagnosis Code   ??? Type 2 diabetes mellitus with hyperglycemia, without long-term current use of insulin (HCC) E11.65   ??? Gastroesophageal reflux disease without esophagitis K21.9   ??? Essential hypertension I10   ??? GAD (generalized anxiety disorder) F41.1     Allergies   Allergen Reactions   ??? Aspirin Palpitations   ??? Morphine Other (comments)     Headache    ??? Nitroglycerin Other (comments)     Headache   ??? Penicillins Swelling     Past Medical History:   Diagnosis Date   ??? IUD (intrauterine device) in place      History reviewed. No pertinent surgical history.  History reviewed. No pertinent family history.  Social History     Tobacco Use   ??? Smoking status: Former Smoker   ??? Smokeless tobacco: Never Used   Substance Use Topics   ??? Alcohol use: Not Currently        Lab Results   Component Value Date/Time    WBC 4.9 03/19/2020 01:36 PM    HGB 13.0 03/19/2020 01:36 PM    HCT 43.3 03/19/2020 01:36 PM    PLATELET 200 03/19/2020 01:36 PM    MCV 78.3 (L) 03/19/2020 01:36 PM     Lab Results   Component Value Date/Time    Hemoglobin A1c 11.0 (H) 03/19/2020 01:36 PM    Glucose 188 (H) 03/19/2020 01:36 PM    Microalbumin/Creat ratio (mg/g creat) 40 (H) 04/15/2020 10:30 AM  Microalbumin,urine random 7.52 04/15/2020 10:30 AM    LDL, calculated 179 (H) 03/19/2020 01:36 PM    Creatinine 0.72 03/19/2020 01:36 PM      Lab Results   Component Value Date/Time    Cholesterol, total 261 (H) 03/19/2020 01:36 PM    HDL Cholesterol 45 03/19/2020 01:36 PM    LDL, calculated 179 (H) 03/19/2020 01:36 PM    Triglyceride 185 (H) 03/19/2020 01:36 PM    CHOL/HDL Ratio 5.8 (H) 03/19/2020 01:36 PM     Lab Results   Component Value Date/Time    ALT (SGPT) 18 03/19/2020 01:36 PM    Alk. phosphatase 53 03/19/2020 01:36 PM    Bilirubin, total 0.5 03/19/2020 01:36 PM    Albumin 3.6 03/19/2020 01:36 PM    Protein, total 6.9 03/19/2020 01:36 PM    INR 1.0  05/18/2009 09:11 AM    Prothrombin time 10.6 05/18/2009 09:11 AM    PLATELET 200 03/19/2020 01:36 PM     Lab Results   Component Value Date/Time    GFR est non-AA >60 03/19/2020 01:36 PM    GFR est AA >60 03/19/2020 01:36 PM    Creatinine 0.72 03/19/2020 01:36 PM    BUN 14 03/19/2020 01:36 PM    Sodium 137 03/19/2020 01:36 PM    Potassium 3.8 03/19/2020 01:36 PM    Chloride 102 03/19/2020 01:36 PM    CO2 30 03/19/2020 01:36 PM     Lab Results   Component Value Date/Time    TSH 1.41 03/19/2020 01:36 PM         Review of Systems  A comprehensive review of systems was negative except for that written in the HPI.    Objective:     Visit Vitals  BP 130/77 (BP 1 Location: Right arm, BP Patient Position: Sitting)   Pulse 75   Temp 98.6 ??F (37 ??C) (Oral)   Resp 14   Ht 5' 5"  (1.651 m)   Wt 218 lb (98.9 kg)   SpO2 97%   BMI 36.28 kg/m??     Physical Examination: General appearance - alert, well appearing, and in no distress, oriented to person, place, and time and well hydrated  Mental status - normal mood, behavior, speech, dress, motor activity, and thought processes  Eyes - sclera anicteric  Neck - supple, no significant adenopathy, thyroid exam: thyroid is normal in size without nodules or tenderness  Chest - clear to auscultation, no wheezes, rales or rhonchi, symmetric air entry  Heart - normal rate, regular rhythm, normal S1, S2, no murmurs, rubs, clicks or gallops, normal bilateral carotid upstroke without bruits, no JVD  Abdomen - soft, nontender, nondistended, no masses or organomegaly  bowel sounds normal  Neurological - alert, oriented, normal speech, no focal findings or movement disorder noted  Extremities - no pedal edema noted, intact peripheral pulses  Skin - normal coloration and turgor, no rashes      Assessment/Plan:     Diabetic issues reviewed with her: low cholesterol diet, weight control and daily exercise discussed, home glucose monitoring emphasized, all medications, side effects and compliance  discussed carefully, foot care discussed and Podiatry visits discussed, annual eye examinations at Ophthalmology discussed, glycohemoglobin and other lab monitoring discussed and long term diabetic complications discussed.   Diagnoses and all orders for this visit:    1. Type 2 diabetes mellitus with hyperglycemia, without long-term current use of insulin ([place])  Discussed with patient, currently her diabetes is uncontrolled  Advised that recommend course of  action at this point is to start insulin as oral medications are unlikely to bring the degree of A1C reduction that is needed  Patient vigorously refuses insulin rx, states she is unwilling to consider starting insulin without first seeing an endocrinologist. We discussed the risks/adverse outcomes associated with uncontrolled diabetes including chronic kidney disease, retinal damage/loss of vision, increased risk of CV events including stroke, MI, and peripheral vascular disease.  I urged her to reconsider starting insulin now as she will likely wait 4 months or more for a new patient appt with a specialist; she is unwilling to move forward with any new plan until seen by endocrine.  Encouraged patient to increase metformin to 1000 mg bid and start pioglitazone- patient states she will increase the metformin dose  Will attempt to get continuous glucose sensor approved as feedback on effects of her diet on her blood glucose will be beneficial in helping to drive dietary changes  -     REFERRAL TO ENDOCRINOLOGY  -     MICROALBUMIN, UR, RAND W/ MICROALB/CREAT RATIO; Future  -     flash glucose sensor (FreeStyle Libre 14 Day Sensor) kit; Remove sensor and apply a new sensor every 14 days Dx: E11.65  -     flash glucose scanning reader (FreeStyle Libre 14 Day Reader) misc; Use as directed DxE11.65    2. GAD (generalized anxiety disorder)  Symptoms increasing  Add rx  -     sertraline (ZOLOFT) 50 mg tablet; Take 1 Tablet by mouth daily.  -     sertraline (ZOLOFT) 50  mg tablet; Take 1 Tablet by mouth daily.    3. Essential hypertension  at goal  Continue diovan-hct, clonidine  Low sodium diet  Weight loss  150 minutes of moderate intensity exercise weekly    4. Hyperlipidemia LDL goal <70  Well above goal  Recently started atorvastatin  Repeat lipids at f/u    5. Severe obesity (BMI 35.0-35.9 with comorbidity) ([place])  I have reviewed/discussed the above normal BMI with the patient.  I have recommended the following interventions: dietary management education, guidance, and counseling, encourage exercise and monitor weight . Marland Kitchen        Follow-up and Dispositions    ?? Return in about 8 weeks (around 06/10/2020) for diabetes.       I have discussed the diagnosis with the patient and the intended plan as seen in the above orders. The patient has received an after-visit summary and questions were answered concerning future plans. Patient conveyed understanding of the plan at the time of the visit.    Angelica Pou, NP

## 2020-04-15 NOTE — Progress Notes (Signed)
Letter mailed to pt.

## 2020-04-15 NOTE — Progress Notes (Signed)
Chief Complaint   Patient presents with   . Follow-up     Pt being seen for fuv  -pt states that she was given a new med  -pt reports she went to [facility] for chest pain  -pt states they ran test and di an EKG which was normal    1. Have you been to the ER, urgent care clinic since your last visit?  Hospitalized since your last visit?[facility] for chest pain    2. Have you seen or consulted any other health care providers outside of the [facility] since your last visit?  Include any pap smears or colon screening. No     Pt has no other concerns

## 2020-04-15 NOTE — Progress Notes (Signed)
Urine protein screening is normal, repeat in 12 months.

## 2020-04-19 ENCOUNTER — Institutional Professional Consult (permissible substitution): Admit: 2020-04-19 | Payer: MEDICARE | Primary: Family

## 2020-04-19 DIAGNOSIS — E1165 Type 2 diabetes mellitus with hyperglycemia: Secondary | ICD-10-CM

## 2020-04-19 NOTE — Progress Notes (Signed)
 [facility]  Diabetes Self-Management Education & Support Program  Pre-program Assessment    Reason for Referral: Diabetes Education  Referral Source: Tassell, Gaylen Q, NP  Services requested: DSMES    ASSESSMENT    From my perspective, the participant would benefit from [facility] specifically related to Reducing risks, Healthy eating, Monitoring, Physical activity, Taking medications, Healthy coping and Problem solving. Will adapt DSMES program to build on participant's strengths in skills score, strengths in confidence score and strengths in preparedness score as noted in the Diabetes Skills, Confidence, and Preparedness Index.    During the program, we will focus on providing DSMES that specifically addresses participant's interest in Healthy eating, Physical activity, Healthy coping and Problem solving, as shown by their reported readiness to change.    The participant would be best served by attending weekly individual sessions.    Diabetes Self-Management Education Follow-up Visit: April 28, 2020       Clinical Presentation  Diana Shaw is a 79 y.o. African American female referred for diabetes self-management education. Participant has Type 2 DM not on insulin for 1-10 years. Family history negative for diabetes. Patient reports receiving DSMES services in the past. Most recent A1c value:   Lab Results   Component Value Date/Time    Hemoglobin A1c 11.0 (H) 03/19/2020 01:36 PM       Diabetes-related medications:  Current dosing:   Key Antihyperglycemic Medications             metFORMIN ER (GLUCOPHAGE XR) 500 mg tablet Take 1 Tablet by mouth two (2) times daily (with meals) for 7 days, THEN 2 Tablets two (2) times daily (with meals) for 83 days.    pioglitazone (ACTOS) 30 mg tablet Take 1 Tablet by mouth daily.    glipiZIDE (GLUCOTROL) 5 mg tablet Take 1 Tab by mouth two (2) times daily (with meals).    Januvia 100 mg tablet Take 1 Tab by mouth daily.          Blood Pressure  Management  Key ACE/ARB Medications             valsartan-hydroCHLOROthiazide (DIOVAN-HCT) 160-12.5 mg per tablet TAKE 1 TABLET BY MOUTH EVERY DAY          Lipid Management  Key Antihyperlipidemia Meds             atorvastatin (LIPITOR) 40 mg tablet Take 1 Tablet by mouth daily.          Clot Prevention  Key Anti-Platelet Anticoagulant Meds     The patient is on no antiplatelet meds or anticoagulants.          Learning Assessment  Learning objectives Educator assessment (04/19/2020)   Diabetes Disease Process  The participant can   A) describe diabetes in basic terms;   B) state the type of diabetes they have; &   C) state accepted blood glucose targets.     Healthy Eating  The participant can   A) identify carbohydrate foods; &   B) accurately read food labels.     Being Active  The participant can  A) state the benefits of physical activity;  B) report their current PA practices;  C) identify PA they would consider incorporating in their lives; &  D) develop an implementation plan.     Monitoring  The participant can  A) operate their blood glucose meter; &  B) describe how they log their blood glucoses to share with their provider.  Taking Medications  The participant can  A) name their diabetes medications;  B) state the purpose and dose;  C) note side effects; &  D) describe proper storage, disposal & transport (if appropriate).     Healthy Coping  The participant can    A) describe their response to diabetes diagnosis;  B) describe their specific coping mechanisms;  C) identify supportive people and/or other resources that positively support their diabetes self-care and health.    Reducing Risks  The participant can describe the preventive measures used by providers to promote health and prevent diabetes complications.     Problem Solving  The participant can   A) identify signs, symptoms & treatment of hypoglycemia;   B) identify signs, symptoms & treatment of hyperglycemia;  C) describe their sick day  plan; &  D) identify BG patterns to discuss with their provider.       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                 []  Low literacy (difficulty with written text)  []  Low numeracy (difficulty with mathematical information  []  Low health literacy (difficulty with understanding health information & services  []  Language  []  Functional limitation  []  Pain   []  Financial  []  Transportation  []  None   Favorite Ways to Learn   []  Lecture  []  Slides  []  Reading []  Video-Internet  []  Cassettes/CDs/MP3's  [x]  Interactive Small Groups []  Other       Behavioral Assessment  Current self-care practices  Educator assessment (04/19/2020)   Healthy Eating  Current practices  24-hour Dietary Recall:  Breakfast:1 slice cheese toast, bacon, apple water  Lunch: chicken wings, collards, sweet pot, diet tea  Dinner: None  Snacks: Ice cream, nuts, pork skins, fruit  Beverages: diet tea, water  Alcohol: None     Would benefit from DSMES related to Healthy Eating: Yes      Eats a carbohydrate controlled diet: No      Stage of change: Preparation   Being Active  Current practices  How many days during the past week have you performed physical activity where your heart beats faster and your breathing is harder than normal for 30 minutes or more?  3 days    How many days in a typical week do you perform activity such as this?  3 days     Would benefit from [facility] related to Being Active: Yes      Exercises 150 minutes/week: No      Stage of change: Preparation     Monitoring  Current practices  Do you monitor your blood sugar? Yes    How often do you monitor? 1x/day    What are the range of readings? 145-245 mg/dL  Do you  know your last A1c measurement? No    Do you know the meaning of the A1c? Yes     Would benefit from [facility] related to Monitoring: Yes      Uses BG readings to establish trends and understand BG patterns: Yes      Stage of change: Action   Taking Medication  Current practices  Do you understand what your diabetes medications do? Yes    How often do you miss doses of your diabetes medications? Never    Can you afford your diabetes medications? Yes   Would benefit from [facility] related to Taking Medication: Yes      Takes medications consistently to receive full benefit: Yes      Stage of change: Preparation       Healthy Coping   Current state  Diabetes Skills, Confidence and Preparedness Index:  Total score: 5.3  Skills: 4.7  Confidence: 5.3  Preparedness: 6.3   Would benefit from DSMES related to Healthy Coping: Yes      Identifies specific people, organizations,etc, that actively support their diabetes self-care efforts: No      Stage of change: Preparation     Reducing Risks  Current state  Vaccines:  Influenza: There is no immunization history for the selected administration types on file for this patient.    Pneumococcal: There is no immunization history for the selected administration types on file for this patient.     Hepatitis: There is no immunization history for the selected administration types on file for this patient.    Examinations:  Diabetic Foot and Eye Exam HM Status   Topic Date Due   . Diabetic Foot Care  Never done   . Eye Exam  Never done        Dental exam: Last appointment was: 03/2020    Foot exam: DUE    Heart Protection:  BP Readings from Last 2 Encounters:   04/15/20 130/77   03/05/20 130/83        Lab Results   Component Value Date/Time    LDL, calculated 179 (H) 03/19/2020 01:36 PM        Kidney Protection:  Lab Results   Component Value Date/Time    Microalbumin/Creat ratio (mg/g creat) 40 (H) 04/15/2020 10:30 AM    Microalbumin,urine random 7.52 04/15/2020 10:30 AM        Would benefit  from [facility] related to Reducing Risks: Yes      Actively participates in decision-making with provider regarding secondary prevention:  Yes      Stage of change: Preparation   Problem Solving  Current state  Hypoglycemia Management:  What are signs and symptoms of hypoglycemia that you experience? Pt reported being unaware of s/s of hypoglycemia    How do you prevent hypoglycemia? Pt reported being unaware of how to prevent hypoglycemia    How do you treat hypoglycemia? Pt reported being unaware of how to treat hypoglycemia    Hyperglycemia Management:  What are signs and symptoms of hyperglycemia that you experience? Pt reported being unaware of s/s of hyperglycemia    How can you prevent hyperglycemia? Pt reported being unaware of how to prevent hyperglycemia    Sick Day Management:  What do you do differently on sick days? Pt reported being unaware of self-management on sick days    Pattern Management:  Do you notice blood glucose patterns when you look at the readings in your meter or logbook? No  How do you use the blood glucose readings from your meter or logbook? Understand how body responds to meals     Would benefit from [facility] related to Problem Solving: Yes      Articulates appropriate strategies to address hypoglycemia, hyperglycemia, sick day care and BG pattern: Yes      Stage of change: Preparation     Note: Content derived from the American Association of Diabetes Educators' Diabetes Education Curriculum: A Guide to Successful Self-Management (3rd edition)      Arlyne Kerns, RN on 04/19/2020 at 12:25 PM      Diana Shaw is a 79 y.o. female being evaluated through an in-person encounter, to address the healthcare issues mentioned above. I was in the office. The patient was in the office. A caregiver was present when appropriate. The patient and/or her healthcare decision maker, is aware that this patient-initiated, Telehealth encounter on 04/19/2020 is a billable service, with coverage as  determined by her insurance carrier. She is aware that she may receive a bill and has provided verbal consent to proceed: Yes.     Time in appointment: 30 minutes        Diabetes Skills, Confidence & Preparedness Index (SCPI)   Thank you for completing the Skills, Confidence & Preparedness Index!  Below are your scores.  All scales and questions are out of 7.  If you would like these results emailed, please enter your email address along with some identifying patient information.  Email:    Patient Identifier:        Overall SCPI score: 5.3 Skills Score: 4.7  Low: Problem Solving(Q6),Healthy Coping(Q7) Confidence Score: 5.3  Low: Healthy Coping(Q2),Reducing Risks(Q3),Healthy Eating(Q4),Blood Sugar Monitoring(Q6),Problem Dnocpwh(V2) Preparedness Score: 6.3  Low: Healthy Eating(Q1),Being Active(Q2),Healthy Coping(Q3),Reducing Risks(Q4)  Healthy Eating Score: 5.5  Low: Skills(Q1),Confidence(Q4) Taking Medication Score: 5.0  Low: Skills(Q2) Blood Sugar Monitoring Score: 5.6  Low: Skills(Q4),Skills(Q8),Confidence(Q6) Reducing Risks Score: 5.3  Low: Skills(Q5),Skills(Q9),Confidence(Q3)  Problem Solving Score: 5.0  Low: Skills(Q6) Healthy Coping Score: 4.7  Low: Dxpood(V2) Being Active Score: 6.0  Low: Confidence(Q5),Preparedness(Q2)    Skills/Knowledge Questions  1. I know how to plan meals that have the best balance between carbohydrates, proteins and vegetables. 5  2. I know how my diabetes medications (pills, injectables and/or insulin) work in my body. 5  3. I know when to check my blood sugar if I want to see how my body responded to a meal. 6  4. I know when to check my blood sugars to determine if my medication or insulin doses are correct. 5  5. I know what to do to prevent a low blood sugar when I exercise (either before, during, or after). 5  6. When I am sick, I know what to do differently with my diabetes management. 3  7. I know how stress can affect my diabetes management. 3  8. When I look at my blood  sugars over a given week, I can explain what my blood sugar pattern is. 5  9. I know what my target levels are for A1c, blood pressure and cholesterol. 5  Confidence Questions  1. I am confident that I can plan balanced meals and snacks. 6  2. I am confident that I can manage my stress. 5  3. I am confident that I can prevent a low blood sugar during or after exercise. 5  4. I am confident that the next time I eat out, I will be able to choose foods that best keep my blood  sugars in target. 5  5. I am confident I can include exercise into my schedule. 6  6. I am confident that I can use my daily blood sugars to adjust my diet, my activity, and/or my insulin. 5  7. When something out of my normal routine happens, I am confident that I can problem-solve and keep my diabetes on track. 5  Preparedness Questions  1. Within the next month, I will begin to eat more balanced meals and snacks. 6  2. Within the next month, I will choose an exercise activity and I will start fitting it into my schedule. 6  3. Within the next month, I will make a list of stress management options that work for me. 6  4. Within the next month, I will consistently plan ahead to prevent low blood sugars. 6  5. Within the next month, I will start adjusting my insulin doses on my own. 0  6. Within the next month, I will begin making changes to my diabetes management based on my daily blood sugars (eg - eating, activity and/or insulin). 7  7. Within the next month, I will begin making changes to my diabetes management to meet my overall goals (eg - eating, activity and/or insulin). 7

## 2020-04-21 NOTE — Telephone Encounter (Signed)
-----   Message from Lorri Frederick sent at 04/21/2020  3:51 PM EDT -----  Regarding: FW: NP Tassell/Telephone    ----- Message -----  From: Verlin Grills  Sent: 04/21/2020   3:41 PM EDT  To: [facility] Front Office Pool  Subject: NP Tassell/Telephone                             General Message/Vendor Calls    Caller's first and last name: Pt       Reason for call: Is requesting medical records be sent to Dr. Elisabeth Pigeon fax number [phone]. The office must receive medical records before making an appointment for patient. This is an endocrinologist office.       Callback required yes/no and why: yes, to confirm.       Best contact number(s): [phone]      Details to clarify the request: N/A       Verlin Grills

## 2020-04-21 NOTE — Telephone Encounter (Signed)
Faxed over as requested and received confirmation.

## 2020-04-22 NOTE — Progress Notes (Signed)
Faxed demographics to Dr Tinnie Gens Endocrinologist per request. Fax #[phone] confirmation received.

## 2020-04-28 ENCOUNTER — Institutional Professional Consult (permissible substitution): Admit: 2020-04-28 | Payer: MEDICARE | Primary: Family

## 2020-04-28 DIAGNOSIS — E1165 Type 2 diabetes mellitus with hyperglycemia: Secondary | ICD-10-CM

## 2020-04-28 NOTE — Progress Notes (Signed)
 [facility]  Diabetes Self-Management Education & Support Program  Encounter note    SUMMARY  Diabetes self-care management training was completed related to Healthy eating. The participant will return on July 13 to continue DSMES related to Physical activity. The participant did not identify SMART Goal(s): , and will practice knowledge and skills related to healthy eating to improve diabetes self-management.    EVALUATION:  Participant is here for follow up instruction on DSMES.  She has had diabetes education in the past, but wanted to review healthy eating.  Reviewed healthy plate and carb counting today.  She was able build plates for 3 meals using food models.  She did very well with label reading and counting carbohydrates.  She will return next week for information on physical activity.    RECOMMENDATIONS:  Sign up for ADA Food Hub  Review nutritional information given  Log food/BG level next week          DATE DSMES TOPIC EVALUATION     04/28/2020 WHAT CAN I EAT?   a. General principles   b. Determining a healthy weight   c. Nutritional terms & tools   . Healthy Plate method   . Carbohydrate Counting   . Reading food labels   . Free apps   d. Pregnancy recommendations      The participant   . Uses Healthy Plate principles in constructing meals Yes  . Reads food labels in choosing acceptable foods Yes     Diana Kerns, RN on 04/28/2020 at 12:00 PM    Diana Shaw is a 79 y.o. female being evaluated throughan in-person encounter, to address the healthcare issues mentioned above. I was in the office. The patient was in the office. A caregiver was present when appropriate. The patient and/or her healthcare decision maker, is aware that this patient-initiated, Telehealth encounter on 04/28/2020 is a billable service, with coverage as determined by her insurance carrier. She is aware that she may receive a bill and has provided verbal consent to proceed: Yes.   Time in appointment:  60 minutes

## 2020-04-29 ENCOUNTER — Ambulatory Visit: Payer: MEDICARE | Primary: Family

## 2020-04-30 ENCOUNTER — Inpatient Hospital Stay: Admit: 2020-04-30 | Payer: MEDICARE | Attending: Family | Primary: Family

## 2020-04-30 DIAGNOSIS — Z780 Asymptomatic menopausal state: Secondary | ICD-10-CM

## 2020-04-30 DIAGNOSIS — Z1231 Encounter for screening mammogram for malignant neoplasm of breast: Secondary | ICD-10-CM

## 2020-04-30 NOTE — Progress Notes (Signed)
Normal mammogram. Next screening is recommended in 12 months.

## 2020-04-30 NOTE — Progress Notes (Signed)
Letter mailed to pt.

## 2020-04-30 NOTE — Progress Notes (Signed)
Normal bone density scan. Next routine screening recommended in 2 years.

## 2020-05-04 ENCOUNTER — Institutional Professional Consult (permissible substitution): Admit: 2020-05-04 | Payer: MEDICARE | Primary: Family

## 2020-05-04 DIAGNOSIS — E1165 Type 2 diabetes mellitus with hyperglycemia: Secondary | ICD-10-CM

## 2020-05-04 NOTE — Progress Notes (Signed)
 [facility]  Diabetes Self-Management Education & Support Program  Encounter note    SUMMARY  Diabetes self-care management training was completed related to Physical activity, Taking medications and Healthy coping. The participant will return on an as needed basis for information to improve diabetes self-management.   Participant received formal diabetes education classes in 2014.    EVALUATION:  Participant appears to be very motivated to make the necessary changes to diet and include physical activity to gain control over diabetes.  She has appointment with Endocrinologist in August.  Contact information given and participant knows she can reach out to me with questions or concerns in the future.    RECOMMENDATIONS:  Participant would benefit from additional DSMES education  Appointment with Endocrinologist                     .                                          DATE DSMES TOPIC EVALUATION     05/04/2020 HOW DO MY DIABETES MEDICATIONS WORK?   a. Type 1 medications & methods   . Insulin injections   . Injection sites   b. Type 2 medications   . Oral agents   . GLP-1 agonists   c. Hypoglycemia symptoms & treatment   . Glucagon emergency kits   d. General guidance regarding insulin use whether Type 1, 2 or gestational diabetes   . Storage of insulin   . Disposal    . Traveling with medications   e. Barriers to medication adherence      The participant   . Can describe the expected action & side effects of prescribed diabetes medications Yes.  . Can demonstrate injection technique (if applicable) Yes.       DATE DSMES TOPIC EVALUATION     05/04/2020 HOW DOES PHYSICAL ACTIVITY AFFECT MY DIABETES?   a. Benefits of physical activity   b. Beginning a program of physical activity   . Walking   . Pedometers   . Goal setting   c. Structured physical activity program   . Aerobic activity   . Resistance   . Flexibility   . Balance   d. Physical activity program progression   e. Safety issues    f. Barriers to physical activity   g. Facilitators of physical activity        The participant has established a regular physical activity plan Yes    The participant would benefit from adding additional activities.     DATE DSMES TOPIC EVALUATION     05/04/2020 HOW DO I FIND SUPPORT TO TACKLE THIS CONDITION?   a. Normal responses to diabetes diagnosis or complication   . Shock   . Anger & resentment   . Guilt/self-blame   . Sadness & worry   . Depression    . Anxiety   . Pregnancy   b. Constructive strategies to normal responses    . Exploring feelings & attitudes   . Motivation: Cost versus benefits analysis   . Problem-solving: Chain analysis   . Obtaining support: Communicating   i. Family & friends   ii. Health team   iii. Community   c. Stress   . Symptoms   . Managing stress   . Fill your tool kit  The participant can identify people that support them in caring for their diabetes health:  Yes    The participant would like to pursue the following in keeping themselves healthy after completing the program:  Yes                     Arlyne Kerns, RN on 05/04/2020 at 10:11 AM    Diana Shaw is a 79 y.o. female being evaluated through an in-person encounter, to address the healthcare issues mentioned above. I was in the office. The patient was in the office. A caregiver was present when appropriate. The patient and/or her healthcare decision maker, is aware that this patient-initiated, Telehealth encounter on 05/04/2020 is a billable service, with coverage as determined by her insurance carrier. She is aware that she may receive a bill and has provided verbal consent to proceed: Yes.     Time in appointment: 60 minutes

## 2020-05-05 ENCOUNTER — Encounter: Primary: Family

## 2020-05-07 ENCOUNTER — Encounter

## 2020-05-07 MED ORDER — SERTRALINE 50 MG TAB
50 mg | ORAL_TABLET | Freq: Every day | ORAL | 1 refills | Status: DC
Start: 2020-05-07 — End: 2020-05-19

## 2020-05-10 NOTE — Telephone Encounter (Signed)
Spoke to pt. Notified message, verbalized understanding.

## 2020-05-10 NOTE — Telephone Encounter (Signed)
Pt has back pain and wants to know if she is taking Aleve can she take Bicyclomine 20mg  please advise

## 2020-05-10 NOTE — Telephone Encounter (Signed)
If she means Dicyclomine (bentyl) the answer is yes.

## 2020-05-14 NOTE — Progress Notes (Signed)
Spoke with patient regarding some concerns she has with mediations that she was prescribed. I made her an appt with Dr. Okey Dupre to discuss further.

## 2020-05-19 ENCOUNTER — Ambulatory Visit: Admit: 2020-05-19 | Discharge: 2020-05-19 | Payer: MEDICARE | Attending: Family Medicine | Primary: Family

## 2020-05-19 ENCOUNTER — Ambulatory Visit: Attending: Family Medicine | Primary: Family

## 2020-05-19 DIAGNOSIS — M25551 Pain in right hip: Secondary | ICD-10-CM

## 2020-05-19 MED ORDER — DICLOFENAC 75 MG TAB, DELAYED RELEASE
75 mg | ORAL_TABLET | Freq: Two times a day (BID) | ORAL | 2 refills | Status: DC
Start: 2020-05-19 — End: 2020-05-24

## 2020-05-19 NOTE — Progress Notes (Signed)
Faxed request for office notes to Dr. Tinnie Gens at [phone] on 05/19/2020.  Faxed request to [facility] at [phone] on 05/19/2020.

## 2020-05-19 NOTE — Progress Notes (Signed)
Chief Complaint   Patient presents with   ??? Diabetes   ??? Hypertension   ??? Hip Pain     Right side     1. Have you been to the ER, urgent care clinic since your last visit?  Hospitalized since your last visit?Yes Where: [facility]  04/13/20 Chest pain: ECG and stress test    2. Have you seen or consulted any other health care providers outside of the [facility] since your last visit?  Include any pap smears or colon screening. No       Diana Shaw  05/19/2020  Provider:   Questionaire:  Diabetes Report Card   1) Have you seen the eye doctor in past year?yes    2) How would you  rate your Diabetic Diet?Very good   3) How well do you take care of your feet?Self care   4) Do you keep your Primary Care Follow Up Appts?yes    5) Do you know your A1C goal?yes    6) Do you take your medications daily?yes    7) Do you check your blood sugars?yes    8) Have you gained weight?no       9) Do you follow an exercise program?yes    10) Can you do better?yes      Lab Results   Component Value Date/Time    Cholesterol, total 261 (H) 03/19/2020 01:36 PM    HDL Cholesterol 45 03/19/2020 01:36 PM    LDL, calculated 179 (H) 03/19/2020 01:36 PM    Triglyceride 185 (H) 03/19/2020 01:36 PM    CHOL/HDL Ratio 5.8 (H) 03/19/2020 01:36 PM     Lab Results   Component Value Date/Time    Hemoglobin A1c 11.0 (H) 03/19/2020 01:36 PM    Glucose 188 (H) 03/19/2020 01:36 PM    Microalbumin/Creat ratio (mg/g creat) 40 (H) 04/15/2020 10:30 AM    Microalbumin,urine random 7.52 04/15/2020 10:30 AM    LDL, calculated 179 (H) 03/19/2020 01:36 PM    Creatinine 0.72 03/19/2020 01:36 PM        Lab Results   Component Value Date/Time    Microalbumin/Creat ratio (mg/g creat) 40 (H) 04/15/2020 10:30 AM    Microalbumin,urine random 7.52 04/15/2020 10:30 AM      Chief Complaint   Patient presents with   ??? Diabetes   ??? Hypertension   ??? Hip Pain     Right side     she is a 79 y.o. year old female who presents for evaluation.    See Diabetic  Report Card listed above.     Patient Active Problem List    Diagnosis   ??? Type 2 diabetes mellitus with hyperglycemia, without long-term current use of insulin (HCC)   ??? Gastroesophageal reflux disease without esophagitis   ??? Essential hypertension   ??? GAD (generalized anxiety disorder)       Reviewed PmHx, RxHx, FmHx, SocHx, AllgHx--dated and updated in the chart.    Review of Systems - negative except as listed above in the HPI    Objective:     Vitals:    05/19/20 1021   BP: (!) 153/90   Pulse: 86   Resp: 15   Temp: 99 ??F (37.2 ??C)   TempSrc: Oral   SpO2: 97%   Weight: 220 lb (99.8 kg)   Height: 5\' 5"  (1.651 m)         Assessment/ Plan:   Diagnoses and all orders for this visit:  1. Right hip pain  -     XR SPINE LUMB 2 OR 3 V; Future  -     diclofenac EC (VOLTAREN) 75 mg EC tablet; Take 1 Tablet by mouth two (2) times a day for 14 days.  -chronic sxs for over a year with lbp and R leg sxs  -add rx  -was better with aleve    2. Type 2 diabetes mellitus with hyperglycemia, without long-term current use of insulin (HCC)  Lab Results   Component Value Date/Time    Hemoglobin A1c 11.0 (H) 03/19/2020 01:36 PM     -not at goal, pt is going to see endo    3. Essential hypertension  -inc with pain    4. Acute right-sided low back pain with sciatica, sciatica laterality unspecified  -     XR SPINE LUMB 2 OR 3 V; Future  -     diclofenac EC (VOLTAREN) 75 mg EC tablet; Take 1 Tablet by mouth two (2) times a day for 14 days.         Lab Results   Component Value Date/Time    Cholesterol, total 261 (H) 03/19/2020 01:36 PM    HDL Cholesterol 45 03/19/2020 01:36 PM    LDL, calculated 179 (H) 03/19/2020 01:36 PM    Triglyceride 185 (H) 03/19/2020 01:36 PM    CHOL/HDL Ratio 5.8 (H) 03/19/2020 01:36 PM     Lab Results   Component Value Date/Time    Hemoglobin A1c 11.0 (H) 03/19/2020 01:36 PM    Microalbumin/Creat ratio (mg/g creat) 40 (H) 04/15/2020 10:30 AM    Microalbumin,urine random 7.52 04/15/2020 10:30 AM    LDL,  calculated 179 (H) 03/19/2020 01:36 PM    Creatinine 0.72 03/19/2020 01:36 PM          Discussed with patient goal of Diabetes to include:  HgA1C <7, LDL cholesterol <100, Blood pressure <140/80.  Discussed with patient diet and weight management and to get regular exercise.  Recommend yearly eye exams and daily foot care.  The patient understands and agrees with the plan.    I have discussed the diagnosis with the patient and the intended plan as seen in the above orders.  The patient has received an after-visit summary and questions were answered concerning future plans.     Medication Side Effects and Warnings were discussed with patient  Patient Labs were reviewed and or requested  Patient Past Records were reviewed and or requested    Danley Danker, M.D.  [facility]    There are no Patient Instructions on file for this visit.

## 2020-05-20 NOTE — Telephone Encounter (Signed)
Pt calling to just states she will be going to [facility] for the x ray.   Also she wants to cx the appt she has on 8/13 with Gaylen.

## 2020-05-24 ENCOUNTER — Inpatient Hospital Stay: Admit: 2020-05-24 | Payer: MEDICARE | Primary: Family

## 2020-05-24 DIAGNOSIS — M4726 Other spondylosis with radiculopathy, lumbar region: Secondary | ICD-10-CM

## 2020-05-24 MED ORDER — CELECOXIB 100 MG CAP
100 mg | ORAL_CAPSULE | Freq: Two times a day (BID) | ORAL | 2 refills | Status: DC
Start: 2020-05-24 — End: 2020-07-13

## 2020-05-24 NOTE — Telephone Encounter (Signed)
DC volt and I will call in another nsaid

## 2020-05-24 NOTE — Progress Notes (Signed)
Spoke to pt. Informed results, verbalized understanding.

## 2020-05-24 NOTE — Progress Notes (Signed)
Your MRI shows significant arthritis and would recommend an MRI or referral if not better.    Okey Dupre

## 2020-05-24 NOTE — Telephone Encounter (Signed)
Pt calling and states was in on 7/28 and given Voltaren which is helping with the hip pain but it causing her to have severe stomach cramps. She states that she was put on a medication Gabapentin by her old provider with no complications with that. Wanted to know if she can be put on that or something else. She can be reached at [phone].

## 2020-05-24 NOTE — Telephone Encounter (Signed)
 Spoke to pt. Notified message, verbalized understanding.

## 2020-06-04 ENCOUNTER — Encounter: Attending: Family | Primary: Family

## 2020-06-16 NOTE — Telephone Encounter (Signed)
-----   Message from Cherlynn Kaiser sent at 06/16/2020  3:45 PM EDT -----  Regarding: Dr. Corky Crafts: [phone]  General Message/Vendor Calls    Caller's first and last name: Pt      Reason for call: F/up on med refill for diabetic supplies requested through [facility] last week.     Callback required yes/no and why: Yes, requested for status/assistance.      Best contact number(s):[phone]      Details to clarify the request: Pt states she has only 2-3 testing strips left, and has to test 4 times daily.        Miranda Sheffield Slider

## 2020-06-17 MED ORDER — TRUE METRIX GLUCOSE TEST STRIP
ORAL_STRIP | 5 refills | Status: AC
Start: 2020-06-17 — End: ?

## 2020-06-17 MED ORDER — LANCETS
11 refills | Status: AC
Start: 2020-06-17 — End: ?

## 2020-06-17 MED ORDER — LANCING DEVICE
0 refills | Status: AC
Start: 2020-06-17 — End: ?

## 2020-06-17 NOTE — Telephone Encounter (Signed)
[employer] requesting [employer] True Metrix Test Strip, Trueplus 33G Lancets, Truedraw Lancing Device,  BD Single Use Swab

## 2020-06-17 NOTE — Telephone Encounter (Signed)
Diabetic supplies faxed to [employer] at [phone]. Confirmed.

## 2020-06-17 NOTE — Telephone Encounter (Signed)
Call in please

## 2020-07-08 NOTE — Telephone Encounter (Signed)
Returned call and spoke with pt and made office visit for 9/21.

## 2020-07-08 NOTE — Telephone Encounter (Signed)
-----   Message from Alonza Smoker sent at 07/08/2020 11:09 AM EDT -----  Subject: Appointment Request    Reason for Call: Routine (Patient Request) No Script    QUESTIONS  Type of Appointment? Established Patient  Reason for appointment request? Requested Provider unavailable - dr. Okey Dupre  Additional Information for Provider? pt would like to be seen in office   for hip pain next available. pt also has a document that needs signed  ---------------------------------------------------------------------------  --------------  CALL BACK INFO  What is the best way for the office to contact you? OK to leave message on   voicemail  Preferred Call Back Phone Number? [phone]  ---------------------------------------------------------------------------  --------------  SCRIPT ANSWERS  Relationship to Patient? Self  Specialty Confirmation? Primary Care  (Is the patient requesting to see the provider for a procedure?)? No  (Is the patient requesting to see the provider urgently - today or   tomorrow.)? No  Have you been diagnosed with, awaiting test results for, or told that you   are suspected of having COVID-19 (Coronavirus)? (If patient has tested   negative or was tested as a requirement for work, school, or travel and   not based on symptoms, answer no)? No  Within the past two weeks have you developed any of the following symptoms   (answer "no" if symptoms have been present longer than 2 weeks or began   more than 2 weeks ago)? Fever or Chills, Cough, Shortness of breath or   difficulty breathing, Loss of taste or smell, Sore throat, Nasal   congestion, Sneezing or runny nose, Fatigue or generalized body aches   (answer no if pain is specific to a body part e.g. back pain), Diarrhea,   Headache? No  Have you had close contact with someone with COVID-19 in the last 14 days?   No  (Service Expert - click yes below to proceed with [employer] As Usual   Scheduling)? Yes

## 2020-07-13 ENCOUNTER — Ambulatory Visit: Admit: 2020-07-13 | Discharge: 2020-07-13 | Payer: MEDICARE | Attending: Family Medicine | Primary: Family

## 2020-07-13 ENCOUNTER — Ambulatory Visit: Attending: Family Medicine | Primary: Family

## 2020-07-13 DIAGNOSIS — E1165 Type 2 diabetes mellitus with hyperglycemia: Secondary | ICD-10-CM

## 2020-07-13 MED ORDER — LIDOCAINE 5 % (700 MG/PATCH) ADHESIVE PATCH
5 % | CUTANEOUS | 5 refills | Status: AC
Start: 2020-07-13 — End: ?

## 2020-07-13 MED ORDER — LIDOCAINE 5 % (700 MG/PATCH) ADHESIVE PATCH
5 % | CUTANEOUS | 5 refills | Status: DC
Start: 2020-07-13 — End: 2020-07-13

## 2020-07-13 NOTE — Progress Notes (Signed)
Patient called and verified. Results/ recommendations reviewed with patient.  Follow up appointment made as needed. Verbalizes understanding.

## 2020-07-13 NOTE — Progress Notes (Signed)
Much improved A1C--cont with plan!    Okey Dupre

## 2020-07-13 NOTE — Progress Notes (Signed)
Chief Complaint   Patient presents with   ??? Hip Pain     Right hip   ??? Foot Pain     Numbness esp.  right foot   ??? Arthritis     Lumbar:  Form  Completion   ??? Diabetes     Now on Insulin per Dr Verdie Mosher     1. Have you been to the ER, urgent care clinic since your last visit?  Hospitalized since your last visit?No    2. Have you seen or consulted any other health care providers outside of the [facility] since your last visit?  Include any pap smears or colon screening. Yes Where: Dr Verdie Mosher      Lab Results   Component Value Date/Time    Microalbumin/Creat ratio (mg/g creat) 40 (H) 04/15/2020 10:30 AM    Microalbumin,urine random 7.52 04/15/2020 10:30 AM      Chief Complaint   Patient presents with   ??? Hip Pain     Right hip   ??? Foot Pain     Numbness esp.  right foot   ??? Arthritis     Lumbar:  Form  Completion   ??? Diabetes     Now on Insulin per Dr Verdie Mosher     she is a 79 y.o. year old female who presents for evaluation.    See Diabetic Report Card listed above.     Patient Active Problem List    Diagnosis   ??? Type 2 diabetes mellitus with hyperglycemia, without long-term current use of insulin (HCC)   ??? Gastroesophageal reflux disease without esophagitis   ??? Essential hypertension   ??? GAD (generalized anxiety disorder)       Reviewed PmHx, RxHx, FmHx, SocHx, AllgHx--dated and updated in the chart.    Review of Systems - negative except as listed above in the HPI    Objective:     Vitals:    07/13/20 1537   BP: 115/69   Pulse: 77   Resp: 18   Temp: 98.5 ??F (36.9 ??C)   TempSrc: Oral   SpO2: 97%   Weight: 219 lb (99.3 kg)   Height: 5\' 5"  (1.651 m)         Assessment/ Plan:   Diagnoses and all orders for this visit:    1. Type 2 diabetes mellitus with hyperglycemia, without long-term current use of insulin (HCC)  -     HEMOGLOBIN A1C WITH EAG; Future  Lab Results   Component Value Date/Time    Hemoglobin A1c 11.0 (H) 03/19/2020 01:36 PM   Ordered A1c today.  Overall her sugars are much better and they are averaging  in the low 100s now seeing endocrinology.  Expecting much better results.    2. Essential hypertension  At goal and better than prior office visit with elevated blood pressure  3. Right hip pain  -     lidocaine (LIDODERM) 5 %; Apply patch to the affected area for 12 hours a day and remove for 12 hours a day.  Patient failed NSAIDs secondary to GI upset, therefore add lidocaine patch see if helps better.  This is filled out forms for accommodations to move her apartment to live on the first floor.  Patient is a fall risk.    4. Hyperlipidemia LDL goal <70  See labs  5. Generalized OA  -     lidocaine (LIDODERM) 5 %; Apply patch to the affected area for 12 hours a day and  remove for 12 hours a day.         Lab Results   Component Value Date/Time    Cholesterol, total 261 (H) 03/19/2020 01:36 PM    HDL Cholesterol 45 03/19/2020 01:36 PM    LDL, calculated 179 (H) 03/19/2020 01:36 PM    Triglyceride 185 (H) 03/19/2020 01:36 PM    CHOL/HDL Ratio 5.8 (H) 03/19/2020 01:36 PM     Lab Results   Component Value Date/Time    Hemoglobin A1c 11.0 (H) 03/19/2020 01:36 PM    Microalbumin/Creat ratio (mg/g creat) 40 (H) 04/15/2020 10:30 AM    Microalbumin,urine random 7.52 04/15/2020 10:30 AM    LDL, calculated 179 (H) 03/19/2020 01:36 PM    Creatinine 0.72 03/19/2020 01:36 PM          Discussed with patient goal of Diabetes to include:  HgA1C <7, LDL cholesterol <100, Blood pressure <140/80.  Discussed with patient diet and weight management and to get regular exercise.  Recommend yearly eye exams and daily foot care.  The patient understands and agrees with the plan.    I have discussed the diagnosis with the patient and the intended plan as seen in the above orders.  The patient has received an after-visit summary and questions were answered concerning future plans.     Medication Side Effects and Warnings were discussed with patient  Patient Labs were reviewed and or requested  Patient Past Records were reviewed and or  requested    Danley Danker, M.D.  [facility]    There are no Patient Instructions on file for this visit.

## 2020-07-14 LAB — HEMOGLOBIN A1C WITH EAG
Est. average glucose: 206 mg/dL
Hemoglobin A1c: 8.8 % — ABNORMAL HIGH (ref 4.0–5.6)

## 2020-07-14 LAB — HEMOGLOBIN A1C W/EAG
Hemoglobin A1C: 8.8 % — ABNORMAL HIGH (ref 4.0–5.6)
eAG: 206 mg/dL

## 2020-07-23 ENCOUNTER — Encounter

## 2020-07-25 MED ORDER — OMEPRAZOLE 20 MG CAP, DELAYED RELEASE
20 mg | ORAL_CAPSULE | ORAL | 1 refills | Status: DC
Start: 2020-07-25 — End: 2020-10-11

## 2020-08-06 ENCOUNTER — Encounter

## 2020-08-08 MED ORDER — CLONIDINE 0.2 MG TAB
0.2 mg | ORAL_TABLET | ORAL | 1 refills | Status: AC
Start: 2020-08-08 — End: ?

## 2020-10-04 NOTE — Progress Notes (Signed)
Faxed 07/13/20 office note & 07/13/20 04/15/20, 03/19/20 lab results to Dr Al Pimple per [facility]. Fax #[phone] confirmation received.

## 2020-10-11 ENCOUNTER — Encounter

## 2020-10-11 MED ORDER — OMEPRAZOLE 20 MG CAP, DELAYED RELEASE
20 mg | ORAL_CAPSULE | Freq: Every day | ORAL | 1 refills | Status: AC
Start: 2020-10-11 — End: ?

## 2020-10-29 ENCOUNTER — Encounter: Attending: "Endocrinology | Primary: Family

## 2021-07-04 ENCOUNTER — Encounter: Payer: Self-pay | Admitting: Internal Medicine

## 2021-07-04 ENCOUNTER — Ambulatory Visit (INDEPENDENT_AMBULATORY_CARE_PROVIDER_SITE_OTHER): Payer: Medicare Other | Admitting: Internal Medicine

## 2021-07-04 ENCOUNTER — Other Ambulatory Visit: Payer: Self-pay

## 2021-07-04 ENCOUNTER — Encounter (INDEPENDENT_AMBULATORY_CARE_PROVIDER_SITE_OTHER): Payer: Self-pay

## 2021-07-04 VITALS — BP 153/92 | HR 79 | Temp 98.9°F | Ht 65.87 in | Wt 208.8 lb

## 2021-07-04 DIAGNOSIS — K219 Gastro-esophageal reflux disease without esophagitis: Secondary | ICD-10-CM | POA: Diagnosis not present

## 2021-07-04 DIAGNOSIS — E119 Type 2 diabetes mellitus without complications: Secondary | ICD-10-CM

## 2021-07-04 LAB — MICROSCOPIC EXAMINATION
Bacteria, UA: NONE SEEN
RBC, Urine: NONE SEEN /hpf (ref 0–2)

## 2021-07-04 LAB — URINALYSIS, ROUTINE W REFLEX MICROSCOPIC
Bilirubin, UA: NEGATIVE
Glucose, UA: NEGATIVE
Ketones, UA: NEGATIVE
Nitrite, UA: NEGATIVE
RBC, UA: NEGATIVE
Specific Gravity, UA: 1.02 (ref 1.005–1.030)
Urobilinogen, Ur: 1 mg/dL (ref 0.2–1.0)
pH, UA: 5.5 (ref 5.0–7.5)

## 2021-07-04 LAB — MICROALBUMIN, URINE WAIVED
Creatinine, Urine Waived: 200 mg/dL (ref 10–300)
Microalb, Ur Waived: 80 mg/L — ABNORMAL HIGH (ref 0–19)

## 2021-07-04 LAB — BAYER DCA HB A1C WAIVED: HB A1C (BAYER DCA - WAIVED): 6.7 % — ABNORMAL HIGH (ref 4.8–5.6)

## 2021-07-04 MED ORDER — LANTUS SOLOSTAR 100 UNIT/ML ~~LOC~~ SOPN: 20.0000 [IU] | PEN_INJECTOR | Freq: Every day | SUBCUTANEOUS | 6 refills | Status: DC

## 2021-07-04 MED ORDER — CITALOPRAM HYDROBROMIDE 10 MG PO TABS: 10.0000 mg | ORAL_TABLET | Freq: Every day | ORAL | 3 refills | Status: DC

## 2021-07-04 MED ORDER — INSULIN DEGLUDEC 100 UNIT/ML ~~LOC~~ SOPN: 20.0000 [IU] | PEN_INJECTOR | Freq: Every day | SUBCUTANEOUS | 7 refills | Status: DC

## 2021-07-04 NOTE — Progress Notes (Signed)
BP (!) 153/92   Pulse 79   Temp 98.9 F (37.2 C) (Oral)   Ht 5' 5.87" (1.673 m)   Wt 208 lb 12.8 oz (94.7 kg)   SpO2 98%   BMI 33.84 kg/m    Subjective:    Patient ID: Vanessa Hogan, female    DOB: August 14, 1941, 80 y.o.   MRN: [id number]  Chief Complaint  Patient presents with   New Patient (Initial Visit)    HPI: Vanessa Hogan is a 80 y.o. female  Pt is here to establish care Moved here from [place],x 1 year ago  moved here for sister who has alzheimers and her husnand is raiding her bank account.  Has DM a1c - 8.1   Chest pressure s/p stress test for such, was hospitlaized for anxiety episodes. Anxiety - is on ativan for such    Diabetes She presents for her initial diabetic visit. She has type 2 diabetes mellitus. Her disease course has been fluctuating. Associated symptoms include blurred vision. Pertinent negatives for diabetes include no chest pain, no fatigue, no foot paresthesias, no foot ulcerations, no polydipsia, no polyphagia and no polyuria. Symptoms are worsening.  Hypertension This is a chronic problem. Associated symptoms include blurred vision. Pertinent negatives include no chest pain.  Gastroesophageal Reflux She reports no chest pain. Pertinent negatives include no fatigue.   Chief Complaint  Patient presents with   New Patient (Initial Visit)    Relevant past medical, surgical, family and social history reviewed and updated as indicated. Interim medical history since our last visit reviewed. Allergies and medications reviewed and updated.  Review of Systems  Constitutional:  Negative for fatigue.  Eyes:  Positive for blurred vision.  Cardiovascular:  Negative for chest pain.  Endocrine: Negative for polydipsia, polyphagia and polyuria.   Per HPI unless specifically indicated above     Objective:    BP (!) 153/92   Pulse 79   Temp 98.9 F (37.2 C) (Oral)   Ht 5' 5.87" (1.673 m)   Wt 208 lb 12.8 oz (94.7 kg)   SpO2 98%   BMI  33.84 kg/m   Wt Readings from Last 3 Encounters:  07/04/21 208 lb 12.8 oz (94.7 kg)    Physical Exam Vitals and nursing note reviewed.  Constitutional:      General: She is not in acute distress.    Appearance: Normal appearance. She is not ill-appearing or diaphoretic.  HENT:     Head: Normocephalic and atraumatic.     Right Ear: Tympanic membrane and external ear normal. There is no impacted cerumen.     Left Ear: External ear normal.     Nose: No congestion or rhinorrhea.     Mouth/Throat:     Pharynx: No oropharyngeal exudate or posterior oropharyngeal erythema.  Eyes:     Conjunctiva/sclera: Conjunctivae normal.     Pupils: Pupils are equal, round, and reactive to light.  Cardiovascular:     Rate and Rhythm: Normal rate and regular rhythm.     Heart sounds: No murmur heard.   No friction rub. No gallop.  Pulmonary:     Effort: No respiratory distress.     Breath sounds: No stridor. No wheezing or rhonchi.  Chest:     Chest wall: No tenderness.  Abdominal:     General: Abdomen is flat. Bowel sounds are normal. There is no distension.     Palpations: Abdomen is soft. There is no mass.     Tenderness: There is no abdominal  tenderness. There is no guarding.  Musculoskeletal:        General: No swelling or deformity.     Cervical back: Normal range of motion and neck supple. No rigidity or tenderness.     Right lower leg: No edema.     Left lower leg: No edema.  Skin:    General: Skin is warm and dry.     Coloration: Skin is not jaundiced.     Findings: No erythema.  Neurological:     Mental Status: She is alert and oriented to person, place, and time. Mental status is at baseline.  Psychiatric:        Mood and Affect: Mood normal.        Behavior: Behavior normal.        Thought Content: Thought content normal.        Judgment: Judgment normal.    No results found for this or any previous visit.      Current Outpatient Medications:    cloNIDine (CATAPRES) 0.2  MG tablet, Take 0.2 mg by mouth 2 (two) times daily., Disp: , Rfl:    GLOBAL EASE INJECT PEN NEEDLES 32G X 4 MM MISC, SMARTSIG:1 needle SUB-Q 3 Times Daily, Disp: , Rfl:    HUMALOG MIX 75/25 KWIKPEN (75-25) 100 UNIT/ML KwikPen, Inject into the skin., Disp: , Rfl:    loratadine (CLARITIN) 10 MG tablet, Take 10 mg by mouth daily., Disp: , Rfl:    omeprazole (PRILOSEC) 20 MG capsule, Take 20 mg by mouth daily., Disp: , Rfl:    OZEMPIC, 0.25 OR 0.5 MG/DOSE, 2 MG/1.5ML SOPN, Inject 0.5 mg into the skin once a week., Disp: , Rfl:    valsartan-hydrochlorothiazide (DIOVAN-HCT) 160-12.5 MG tablet, Take 1 tablet by mouth daily., Disp: , Rfl:     Assessment & Plan:  Dm a1c  FSBS - 80 - 130 on ozempic,0.25 mg   humalog 75/25 18 untis in am  and 20 untis in pm Start pt on tresiba 20 untis q pm check HbA1c,  urine  microalbumin  diabetic diet plan given to pt  adviced regarding hypoglycemia and instructions given to pt today on how to prevent and treat the same if it were to occur. pt acknowledges the plan and voices understanding of the same.  exercise plan given and encouraged.   advice diabetic yearly podiatry, ophthalmology , nutritionist , dental check q 6 months,  2. HTN : valsartan / hctz and clonidine  Continue current meds.  Medication compliance emphasised. pt advised to keep Bp logs. Pt verbalised understanding of the same. Pt to have a low salt diet . Exercise to reach a goal of at least 150 mins a week.  lifestyle modifications explained and pt understands importance of the above.  3. Depression / anxiety Anxiety / depression : will start celexa 10 mg daily , potential Side effects dw pt. to call office if develops any SE. will fu with me in 1 month for such. pt verbalised understanding.     Problem List Items Addressed This Visit   None    Orders Placed This Encounter  Procedures   Microscopic Examination   Bayer DCA Hb A1c Waived   Basic metabolic panel   Vitamin B12   Urinalysis,  Routine w reflex microscopic   Microalbumin, Urine Waived   Lipid Panel Piccolo, Waived   TSH   T4, free   Urinalysis, Routine w reflex microscopic   Microalbumin, Urine Waived   Vitamin B12  Meds ordered this encounter  Medications   DISCONTD: insulin glargine (LANTUS SOLOSTAR) 100 UNIT/ML Solostar Pen    Sig: Inject 20 Units into the skin at bedtime.    Dispense:  100 mL    Refill:  6   citalopram (CELEXA) 10 MG tablet    Sig: Take 1 tablet (10 mg total) by mouth daily.    Dispense:  30 tablet    Refill:  3   insulin degludec (TRESIBA) 100 UNIT/ML FlexTouch Pen    Sig: Inject 20 Units into the skin at bedtime.    Dispense:  100 mL    Refill:  7     Follow up plan: No follow-ups on file.

## 2021-07-05 LAB — BASIC METABOLIC PANEL
BUN/Creatinine Ratio: 12 (ref 12–28)
BUN: 11 mg/dL (ref 8–27)
CO2: 25 mmol/L (ref 20–29)
Calcium: 9.6 mg/dL (ref 8.7–10.3)
Chloride: 103 mmol/L (ref 96–106)
Creatinine, Ser: 0.93 mg/dL (ref 0.57–1.00)
Glucose: 72 mg/dL (ref 65–99)
Potassium: 3.5 mmol/L (ref 3.5–5.2)
Sodium: 143 mmol/L (ref 134–144)
eGFR: 63 mL/min/{1.73_m2} (ref >59)

## 2021-07-05 LAB — VITAMIN B12: Vitamin B-12: 716 pg/mL (ref 232–1245)

## 2021-07-21 ENCOUNTER — Ambulatory Visit (INDEPENDENT_AMBULATORY_CARE_PROVIDER_SITE_OTHER): Payer: Medicare Other | Admitting: Internal Medicine

## 2021-07-21 ENCOUNTER — Other Ambulatory Visit: Payer: Self-pay

## 2021-07-21 ENCOUNTER — Encounter: Payer: Self-pay | Admitting: Internal Medicine

## 2021-07-21 VITALS — BP 136/83 | HR 83 | Temp 98.7°F | Ht 65.75 in | Wt 207.2 lb

## 2021-07-21 DIAGNOSIS — M79672 Pain in left foot: Secondary | ICD-10-CM | POA: Diagnosis not present

## 2021-07-21 DIAGNOSIS — E119 Type 2 diabetes mellitus without complications: Secondary | ICD-10-CM | POA: Diagnosis not present

## 2021-07-21 DIAGNOSIS — K219 Gastro-esophageal reflux disease without esophagitis: Secondary | ICD-10-CM | POA: Insufficient documentation

## 2021-07-21 DIAGNOSIS — E1129 Type 2 diabetes mellitus with other diabetic kidney complication: Secondary | ICD-10-CM | POA: Insufficient documentation

## 2021-07-21 MED ORDER — TERBINAFINE HCL 250 MG PO TABS: 250.0000 mg | ORAL_TABLET | Freq: Every day | ORAL | 3 refills | Status: DC

## 2021-07-21 MED ORDER — CEPHALEXIN 500 MG PO CAPS: 500.0000 mg | ORAL_CAPSULE | Freq: Two times a day (BID) | ORAL | 0 refills | Status: DC

## 2021-07-21 NOTE — Patient Instructions (Signed)
Cut back  on lantus q pm to 15 untis.  and 10 units of humalog 75/25 in am

## 2021-07-21 NOTE — Progress Notes (Signed)
BP 136/83   Pulse 83   Temp 98.7 F (37.1 C) (Oral)   Ht 5' 5.75" (1.67 m)   Wt 207 lb 3.2 oz (94 kg)   SpO2 98%   BMI 33.70 kg/m    Subjective:    Patient ID: Vanessa Hogan, female    DOB: 12/26/1940, 80 y.o.   MRN: [id number]  Chief Complaint  Patient presents with   Bunions    Left foot, painful today    HPI: Vanessa Hogan is a 80 y.o. female  Foot pain x Tuesday - extreme pain and has been swlloen and red x then.    Chief Complaint  Patient presents with   Bunions    Left foot, painful today    Relevant past medical, surgical, family and social history reviewed and updated as indicated. Interim medical history since our last visit reviewed. Allergies and medications reviewed and updated.  Review of Systems  Per HPI unless specifically indicated above     Objective:    BP 136/83   Pulse 83   Temp 98.7 F (37.1 C) (Oral)   Ht 5' 5.75" (1.67 m)   Wt 207 lb 3.2 oz (94 kg)   SpO2 98%   BMI 33.70 kg/m   Wt Readings from Last 3 Encounters:  07/21/21 207 lb 3.2 oz (94 kg)  07/04/21 208 lb 12.8 oz (94.7 kg)    Physical Exam  Results for orders placed or performed in visit on 07/04/21  Microscopic Examination   BLD  Result Value Ref Range   WBC, UA 0-5 0 - 5 /hpf   RBC None seen 0 - 2 /hpf   Epithelial Cells (non renal) 0-10 0 - 10 /hpf   Mucus, UA Present (A) Not Estab.   Bacteria, UA None seen None seen/Few  Bayer DCA Hb A1c Waived  Result Value Ref Range   HB A1C (BAYER DCA - WAIVED) 6.7 (H) 4.8 - 5.6 %  Basic metabolic panel  Result Value Ref Range   Glucose 72 65 - 99 mg/dL   BUN 11 8 - 27 mg/dL   Creatinine, Ser 0.93 0.57 - 1.00 mg/dL   eGFR 63 >59 mL/min/1.73   BUN/Creatinine Ratio 12 12 - 28   Sodium 143 134 - 144 mmol/L   Potassium 3.5 3.5 - 5.2 mmol/L   Chloride 103 96 - 106 mmol/L   CO2 25 20 - 29 mmol/L   Calcium 9.6 8.7 - 10.3 mg/dL  Urinalysis, Routine w reflex microscopic  Result Value Ref Range    Specific Gravity, UA 1.020 1.005 - 1.030   pH, UA 5.5 5.0 - 7.5   Color, UA Yellow Yellow   Appearance Ur Cloudy (A) Clear   Leukocytes,UA 2+ (A) Negative   Protein,UA 1+ (A) Negative/Trace   Glucose, UA Negative Negative   Ketones, UA Negative Negative   RBC, UA Negative Negative   Bilirubin, UA Negative Negative   Urobilinogen, Ur 1.0 0.2 - 1.0 mg/dL   Nitrite, UA Negative Negative   Microscopic Examination See below:   Microalbumin, Urine Waived  Result Value Ref Range   Microalb, Ur Waived 80 (H) 0 - 19 mg/L   Creatinine, Urine Waived 200 10 - 300 mg/dL   Microalb/Creat Ratio 30-300 (H) <30 mg/g  Vitamin B12  Result Value Ref Range   Vitamin B-12 716 232 - 1,245 pg/mL        Current Outpatient Medications:    cephALEXin (KEFLEX) 500 MG capsule, Take 1  capsule (500 mg total) by mouth 2 (two) times daily for 7 days., Disp: 14 capsule, Rfl: 0   cloNIDine (CATAPRES) 0.2 MG tablet, Take 0.2 mg by mouth 2 (two) times daily., Disp: , Rfl:    GLOBAL EASE INJECT PEN NEEDLES 32G X 4 MM MISC, SMARTSIG:1 needle SUB-Q 3 Times Daily, Disp: , Rfl:    HUMALOG MIX 75/25 KWIKPEN (75-25) 100 UNIT/ML KwikPen, Inject into the skin., Disp: , Rfl:    insulin degludec (TRESIBA) 100 UNIT/ML FlexTouch Pen, Inject 20 Units into the skin at bedtime., Disp: 100 mL, Rfl: 7   loratadine (CLARITIN) 10 MG tablet, Take 10 mg by mouth daily., Disp: , Rfl:    omeprazole (PRILOSEC) 20 MG capsule, Take 20 mg by mouth daily., Disp: , Rfl:    OZEMPIC, 0.25 OR 0.5 MG/DOSE, 2 MG/1.5ML SOPN, Inject 0.5 mg into the skin once a week., Disp: , Rfl:    terbinafine (LAMISIL) 250 MG tablet, Take 1 tablet (250 mg total) by mouth daily., Disp: 30 tablet, Rfl: 3   citalopram (CELEXA) 10 MG tablet, Take 1 tablet (10 mg total) by mouth daily. (Patient not taking: Reported on 07/21/2021), Disp: 30 tablet, Rfl: 3   valsartan-hydrochlorothiazide (DIOVAN-HCT) 160-12.5 MG tablet, Take 1 tablet by mouth daily., Disp: , Rfl:      Assessment & Plan:  DM a1c is at 6.7 is on ozempic for such changed her diet. On diagnosis she was at 65.  Cut back  on lantus q pm to 15 untis and 10 units of humalog 75/25 in am  Cut back to 15 untis  and 10 units of humalog at night.  check HbA1c,  urine  microalbumin  diabetic diet plan given to pt  adviced regarding hypoglycemia and instructions given to pt today on how to prevent and treat the same if it were to occur. pt acknowledges the plan and voices understanding of the same.  exercise plan given and encouraged.   advice diabetic yearly podiatry, ophthalmology , nutritionist , dental check q 6 months  2. Foot pain  Check uric acid levels, check CBC Xray foot.  Stop shell fish   3. HTN is on diovan - hctz 10 - 12.5 Continue current meds.  Medication compliance emphasised. pt advised to keep Bp logs. Pt verbalised understanding of the same. Pt to have a low salt diet . Exercise to reach a goal of at least 150 mins a week.  lifestyle modifications explained and pt understands importance of the above.  4. Onychomycosis Will start pt on lamisil   Problem List Items Addressed This Visit       Endocrine   Diabetes mellitus without complication ([place])   Other Visit Diagnoses     Foot pain, left    -  Primary   Relevant Orders   Uric acid   DG Foot Complete Left   CBC with Differential/Platelet   Ambulatory referral to Podiatry        Orders Placed This Encounter  Procedures   DG Foot Complete Left   Uric acid   CBC with Differential/Platelet   Ambulatory referral to Podiatry     Meds ordered this encounter  Medications   terbinafine (LAMISIL) 250 MG tablet    Sig: Take 1 tablet (250 mg total) by mouth daily.    Dispense:  30 tablet    Refill:  3   cephALEXin (KEFLEX) 500 MG capsule    Sig: Take 1 capsule (500 mg total) by mouth 2 (two)  times daily for 7 days.    Dispense:  14 capsule    Refill:  0     Follow up plan: Return in about 3 months (around  10/20/2021).

## 2021-07-22 DIAGNOSIS — M79672 Pain in left foot: Secondary | ICD-10-CM | POA: Insufficient documentation

## 2021-07-22 LAB — CBC WITH DIFFERENTIAL/PLATELET
Basophils Absolute: 0 10*3/uL (ref 0.0–0.2)
Basos: 0 %
EOS (ABSOLUTE): 0.1 10*3/uL (ref 0.0–0.4)
Eos: 2 %
Hematocrit: 44.7 % (ref 34.0–46.6)
Hemoglobin: 14.2 g/dL (ref 11.1–15.9)
Immature Grans (Abs): 0 10*3/uL (ref 0.0–0.1)
Immature Granulocytes: 0 %
Lymphocytes Absolute: 2 10*3/uL (ref 0.7–3.1)
Lymphs: 36 %
MCH: 24.1 pg — ABNORMAL LOW (ref 26.6–33.0)
MCHC: 31.8 g/dL (ref 31.5–35.7)
MCV: 76 fL — ABNORMAL LOW (ref 79–97)
Monocytes Absolute: 0.3 10*3/uL (ref 0.1–0.9)
Monocytes: 6 %
Neutrophils Absolute: 3.1 10*3/uL (ref 1.4–7.0)
Neutrophils: 56 %
Platelets: 247 10*3/uL (ref 150–450)
RBC: 5.89 x10E6/uL — ABNORMAL HIGH (ref 3.77–5.28)
RDW: 15.6 % — ABNORMAL HIGH (ref 11.7–15.4)
WBC: 5.6 10*3/uL (ref 3.4–10.8)

## 2021-07-22 LAB — URIC ACID: Uric Acid: 8.1 mg/dL — ABNORMAL HIGH (ref 3.1–7.9)

## 2021-07-22 MED ORDER — METHYLPREDNISOLONE 4 MG PO TBPK: ORAL_TABLET | ORAL | 0 refills | Status: DC

## 2021-07-22 MED ORDER — COLCHICINE 0.6 MG PO TABS: 0.6000 mg | ORAL_TABLET | Freq: Every day | ORAL | 1 refills | Status: DC

## 2021-07-22 NOTE — Addendum Note (Signed)
Addended by: Loura Pardon on: 07/22/2021 08:54 AM   Modules accepted: Orders

## 2021-07-22 NOTE — Progress Notes (Signed)
Pts uric acid is high please let her know will start her on colchicine and steroids. This is indicative of gout. Fu with me x 2 weeks with repeat URIC Acid levels and CMP 'thnx.

## 2021-07-25 ENCOUNTER — Ambulatory Visit: Payer: Self-pay | Admitting: *Deleted

## 2021-07-25 ENCOUNTER — Ambulatory Visit: Payer: Medicare Other | Admitting: Internal Medicine

## 2021-07-25 NOTE — Telephone Encounter (Signed)
Reason for Disposition  [1] Caller has URGENT medicine question about med that PCP or specialist prescribed AND [2] triager unable to answer question    She is going to check her temperature and call us back.    She needs to find her thermometer.  Answer Assessment - Initial Assessment Questions 1. NAME of MEDICATION: "What medicine are you calling about?"     I'm taking medication since 07/22/2021.   Yesterday I started feeling cold.  Today I'm more cold.   I also had a 2nd prescription I took at 11:00. Keflex I'm feeling light headed so I didn't take it today.   The only medicine I took colchicine today.    The antibiotic is for a infection in my foot.    2. QUESTION: "What is your question?" (e.g., double dose of medicine, side effect)     I'm wondering if these medications are causing me to feel so cold.   The gout medicine is the only medicine I took today.   I don't feel good today.  I'm feeling so tired today.   I took a nap.   She did not tell me to stop the antibiotic I just stopped it myself yesterday.   My foot is much better.    3. PRESCRIBING HCP: "Who prescribed it?" Reason: if prescribed by specialist, call should be referred to that group.     Dr. Roma Schanz Vigg  4. SYMPTOMS: "Do you have any symptoms?"     I'm cold and had a little lower back pain that I've had before.   5. SEVERITY: If symptoms are present, ask "Are they mild, moderate or severe?"     Moderate   "I'm not feeling as well today". 6. PREGNANCY:  "Is there any chance that you are pregnant?" "When was your last menstrual period?"     N/A due to age  Protocols used: Medication Question Call-A-AH

## 2021-07-25 NOTE — Telephone Encounter (Signed)
Pt called in c/o being cold, tired and not feeling well.   She started taking Keflex on 07/22/2021 for an infection in her foot that was prescribed by Dr. Charlotta Newton.   She was also diagnosed with gout per her blood work and started on colchicine.   She said the Keflex is making her feel lightheaded so she did not take it today.   "Since Dr. Charlotta Newton said I had gout I decided to stop the antibiotic, the Keflex".   "I think the colchicine might be making me feel cold".    "I can't get warm".     She is feeling tired and took a nap today.  I asked her to check her temperature that it sounded like she may have fever.   She has to find her thermometer so is going to call us back after checking her temperature.   I let her know I would pass along the symptoms she was having to Dr. Charlotta Newton especially that she had stopped the Keflex for the infection in her foot.   Pt was agreeable to calling us back instead of having me wait while she looked for her thermometer.     I sent her notes to [facility].

## 2021-07-26 NOTE — Telephone Encounter (Signed)
Called and spoke with patient. She said is already scheduled for an appt tin 2 days to follow up with her foot with Dr Charlotta Newton. She wants to keep that appt since she feels her foot is doing a lot better after taking half of the keflex. She did stop the abx. She will call before her scheduled appt if she has concerns of her infection becoming worse.

## 2021-07-28 ENCOUNTER — Encounter: Payer: Self-pay | Admitting: Internal Medicine

## 2021-07-28 ENCOUNTER — Other Ambulatory Visit: Payer: Self-pay

## 2021-07-28 ENCOUNTER — Ambulatory Visit (INDEPENDENT_AMBULATORY_CARE_PROVIDER_SITE_OTHER): Payer: Medicare Other | Admitting: Internal Medicine

## 2021-07-28 VITALS — BP 117/76 | HR 80 | Temp 98.6°F | Ht 65.75 in | Wt 205.6 lb

## 2021-07-28 DIAGNOSIS — Z13820 Encounter for screening for osteoporosis: Secondary | ICD-10-CM

## 2021-07-28 DIAGNOSIS — M109 Gout, unspecified: Secondary | ICD-10-CM

## 2021-07-28 NOTE — Progress Notes (Signed)
BP 117/76   Pulse 80   Temp 98.6 F (37 C) (Oral)   Ht 5' 5.75" (1.67 m)   Wt 205 lb 9.6 oz (93.3 kg)   SpO2 99%   BMI 33.44 kg/m    Subjective:    Patient ID: Vanessa Hogan, female    DOB: 05/03/1941, 80 y.o.   MRN: [id number]  Chief Complaint  Patient presents with  . Gout    Patient states that her foot is feeling much better.     HPI: Vanessa Hogan is a 80 y.o. female  Pt is here for a fu on her acute foot pain , her erythema is much improved so is the pain, she was palced on medrol dose pak , colchicine for a high uric acid of 8.1 and this helped her.  She d/c keflex sec to abdominal symptoms - didn't feel well per her verbal record.   Rash This is a new problem. The current episode started 1 to 4 weeks ago. The problem has been rapidly improving since onset. Pertinent negatives include no anorexia, facial edema, rhinorrhea or shortness of breath. The treatment provided moderate relief.   Chief Complaint  Patient presents with  . Gout    Patient states that her foot is feeling much better.     Relevant past medical, surgical, family and social history reviewed and updated as indicated. Interim medical history since our last visit reviewed. Allergies and medications reviewed and updated.  Review of Systems  HENT:  Negative for rhinorrhea.   Respiratory:  Negative for shortness of breath.   Gastrointestinal:  Negative for anorexia.  Skin:  Positive for rash.   Per HPI unless specifically indicated above     Objective:    BP 117/76   Pulse 80   Temp 98.6 F (37 C) (Oral)   Ht 5' 5.75" (1.67 m)   Wt 205 lb 9.6 oz (93.3 kg)   SpO2 99%   BMI 33.44 kg/m   Wt Readings from Last 3 Encounters:  07/28/21 205 lb 9.6 oz (93.3 kg)  07/21/21 207 lb 3.2 oz (94 kg)  07/04/21 208 lb 12.8 oz (94.7 kg)    Physical Exam Vitals and nursing note reviewed.  Constitutional:      General: She is not in acute distress.    Appearance: Normal  appearance. She is not ill-appearing or diaphoretic.  Pulmonary:     Breath sounds: No rhonchi.  Skin:    General: Skin is warm and dry.     Coloration: Skin is not jaundiced or pale.     Findings: No bruising, erythema, lesion or rash.  Neurological:     Mental Status: She is alert.   Results for orders placed or performed in visit on 07/21/21  Uric acid  Result Value Ref Range   Uric Acid 8.1 (H) 3.1 - 7.9 mg/dL  CBC with Differential/Platelet  Result Value Ref Range   WBC 5.6 3.4 - 10.8 x10E3/uL   RBC 5.89 (H) 3.77 - 5.28 x10E6/uL   Hemoglobin 14.2 11.1 - 15.9 g/dL   Hematocrit 41.9 37.9 - 46.6 %   MCV 76 (L) 79 - 97 fL   MCH 24.1 (L) 26.6 - 33.0 pg   MCHC 31.8 31.5 - 35.7 g/dL   RDW 02.4 (H) 09.7 - 35.3 %   Platelets 247 150 - 450 x10E3/uL   Neutrophils 56 Not Estab. %   Lymphs 36 Not Estab. %   Monocytes 6 Not Estab. %  Eos 2 Not Estab. %   Basos 0 Not Estab. %   Neutrophils Absolute 3.1 1.4 - 7.0 x10E3/uL   Lymphocytes Absolute 2.0 0.7 - 3.1 x10E3/uL   Monocytes Absolute 0.3 0.1 - 0.9 x10E3/uL   EOS (ABSOLUTE) 0.1 0.0 - 0.4 x10E3/uL   Basophils Absolute 0.0 0.0 - 0.2 x10E3/uL   Immature Granulocytes 0 Not Estab. %   Immature Grans (Abs) 0.0 0.0 - 0.1 x10E3/uL        Current Outpatient Medications:  .  cephALEXin (KEFLEX) 500 MG capsule, Take 1 capsule (500 mg total) by mouth 2 (two) times daily for 7 days., Disp: 14 capsule, Rfl: 0 .  citalopram (CELEXA) 10 MG tablet, Take 1 tablet (10 mg total) by mouth daily. (Patient not taking: Reported on 07/21/2021), Disp: 30 tablet, Rfl: 3 .  cloNIDine (CATAPRES) 0.2 MG tablet, Take 0.2 mg by mouth 2 (two) times daily., Disp: , Rfl:  .  colchicine 0.6 MG tablet, Take 1 tablet (0.6 mg total) by mouth daily., Disp: 30 tablet, Rfl: 1 .  GLOBAL EASE INJECT PEN NEEDLES 32G X 4 MM MISC, SMARTSIG:1 needle SUB-Q 3 Times Daily, Disp: , Rfl:  .  HUMALOG MIX 75/25 KWIKPEN (75-25) 100 UNIT/ML KwikPen, Inject into the skin., Disp: ,  Rfl:  .  insulin degludec (TRESIBA) 100 UNIT/ML FlexTouch Pen, Inject 20 Units into the skin at bedtime., Disp: 100 mL, Rfl: 7 .  loratadine (CLARITIN) 10 MG tablet, Take 10 mg by mouth daily., Disp: , Rfl:  .  methylPREDNISolone (MEDROL DOSEPAK) 4 MG TBPK tablet, Use as directed, Disp: 1 each, Rfl: 0 .  omeprazole (PRILOSEC) 20 MG capsule, Take 20 mg by mouth daily., Disp: , Rfl:  .  OZEMPIC, 0.25 OR 0.5 MG/DOSE, 2 MG/1.5ML SOPN, Inject 0.5 mg into the skin once a week., Disp: , Rfl:  .  terbinafine (LAMISIL) 250 MG tablet, Take 1 tablet (250 mg total) by mouth daily., Disp: 30 tablet, Rfl: 3 .  valsartan-hydrochlorothiazide (DIOVAN-HCT) 160-12.5 MG tablet, Take 1 tablet by mouth daily., Disp: , Rfl:     Assessment & Plan:   Problem List Items Addressed This Visit   None    No orders of the defined types were placed in this encounter.    No orders of the defined types were placed in this encounter.    Follow up plan: No follow-ups on file.

## 2021-07-28 NOTE — Patient Instructions (Signed)
Gout Gout is a condition that causes painful swelling of the joints. Gout is a type of inflammation of the joints (arthritis). This condition is caused by having too much uric acid in the body. Uric acid is a chemical that forms when the body breaks down substances called purines. Purines are important for building body proteins. When the body has too much uric acid, sharp crystals can form and build up inside the joints. This causes pain and swelling. Gout attacks can happen quickly and may be very painful (acute gout). Over time, the attacks can affect more joints and become more frequent (chronic gout). Gout can also cause uric acid to build up under the skin and inside the kidneys. What are the causes? This condition is caused by too much uric acid in your blood. This can happen because: Your kidneys do not remove enough uric acid from your blood. This is the most common cause. Your body makes too much uric acid. This can happen with some cancers and cancer treatments. It can also occur if your body is breaking down too many red blood cells (hemolytic anemia). You eat too many foods that are high in purines. These foods include organ meats and some seafood. Alcohol, especially beer, is also high in purines. A gout attack may be triggered by trauma or stress. What increases the risk? You are more likely to develop this condition if you: Have a family history of gout. Are female and middle-aged. Are female and have gone through menopause. Are obese. Frequently drink alcohol, especially beer. Are dehydrated. Lose weight too quickly. Have an organ transplant. Have lead poisoning. Take certain medicines, including aspirin, cyclosporine, diuretics, levodopa, and niacin. Have kidney disease. Have a skin condition called psoriasis. What are the signs or symptoms? An attack of acute gout happens quickly. It usually occurs in just one joint. The most common place is the big toe. Attacks often start  at night. Other joints that may be affected include joints of the feet, ankle, knee, fingers, wrist, or elbow. Symptoms of this condition may include: Severe pain. Warmth. Swelling. Stiffness. Tenderness. The affected joint may be very painful to touch. Shiny, red, or purple skin. Chills and fever. Chronic gout may cause symptoms more frequently. More joints may be involved. You may also have white or yellow lumps (tophi) on your hands or feet or in other areas near your joints. How is this diagnosed? This condition is diagnosed based on your symptoms, medical history, and physical exam. You may have tests, such as: Blood tests to measure uric acid levels. Removal of joint fluid with a thin needle (aspiration) to look for uric acid crystals. X-rays to look for joint damage. How is this treated? Treatment for this condition has two phases: treating an acute attack and preventing future attacks. Acute gout treatment may include medicines to reduce pain and swelling, including: NSAIDs. Steroids. These are strong anti-inflammatory medicines that can be taken by mouth (orally) or injected into a joint. Colchicine. This medicine relieves pain and swelling when it is taken soon after an attack. It can be given by mouth or through an IV. Preventive treatment may include: Daily use of smaller doses of NSAIDs or colchicine. Use of a medicine that reduces uric acid levels in your blood. Changes to your diet. You may need to see a dietitian about what to eat and drink to prevent gout. Follow these instructions at home: During a gout attack  If directed, put ice on the affected area:  Put ice in a plastic bag. Place a towel between your skin and the bag. Leave the ice on for 20 minutes, 2-3 times a day. Raise (elevate) the affected joint above the level of your heart as often as possible. Rest the joint as much as possible. If the affected joint is in your leg, you may be given crutches to  use. Follow instructions from your health care provider about eating or drinking restrictions. Avoiding future gout attacks Follow a low-purine diet as told by your dietitian or health care provider. Avoid foods and drinks that are high in purines, including liver, kidney, anchovies, asparagus, herring, mushrooms, mussels, and beer. Maintain a healthy weight or lose weight if you are overweight. If you want to lose weight, talk with your health care provider. It is important that you do not lose weight too quickly. Start or maintain an exercise program as told by your health care provider. Eating and drinking Drink enough fluids to keep your urine pale yellow. If you drink alcohol: Limit how much you use to: 0-1 drink a day for women. 0-2 drinks a day for men. Be aware of how much alcohol is in your drink. In the [place] one drink equals one 12 oz bottle of beer (355 mL) one 5 oz glass of wine (148 mL), or one 1 oz glass of hard liquor (44 mL). General instructions Take over-the-counter and prescription medicines only as told by your health care provider. Do not drive or use heavy machinery while taking prescription pain medicine. Return to your normal activities as told by your health care provider. Ask your health care provider what activities are safe for you. Keep all follow-up visits as told by your health care provider. This is important. Contact a health care provider if you have: Another gout attack. Continuing symptoms of a gout attack after 10 days of treatment. Side effects from your medicines. Chills or a fever. Burning pain when you urinate. Pain in your lower back or belly. Get help right away if you: Have severe or uncontrolled pain. Cannot urinate. Summary Gout is painful swelling of the joints caused by inflammation. The most common site of pain is the big toe, but it can affect other joints in the body. Medicines and dietary changes can help to prevent and treat gout  attacks. This information is not intended to replace advice given to you by your health care provider. Make sure you discuss any questions you have with your health care provider. Document Revised: 05/01/2018 Document Reviewed: 05/01/2018 Elsevier Patient Education  2022 [employer] Gout Gout is a condition that causes painful swelling of the joints. Gout is a type of inflammation of the joints (arthritis). This condition is caused by having too much uric acid in the body. Uric acid is a chemical that forms when the body breaks down substances called purines. Purines are important for building body proteins. When the body has too much uric acid, sharp crystals can form and build up inside the joints. This causes pain and swelling. Gout attacks can happen quickly and may be very painful (acute gout). Over time, the attacks can affect more joints and become more frequent (chronic gout). Gout can also cause uric acid to build up under the skin and inside the kidneys. What are the causes? This condition is caused by too much uric acid in your blood. This can happen because: Your kidneys do not remove enough uric acid from your blood. This is the most common cause. Your  body makes too much uric acid. This can happen with some cancers and cancer treatments. It can also occur if your body is breaking down too many red blood cells (hemolytic anemia). You eat too many foods that are high in purines. These foods include organ meats and some seafood. Alcohol, especially beer, is also high in purines. A gout attack may be triggered by trauma or stress. What increases the risk? You are more likely to develop this condition if you: Have a family history of gout. Are female and middle-aged. Are female and have gone through menopause. Are obese. Frequently drink alcohol, especially beer. Are dehydrated. Lose weight too quickly. Have an organ transplant. Have lead poisoning. Take certain medicines, including  aspirin, cyclosporine, diuretics, levodopa, and niacin. Have kidney disease. Have a skin condition called psoriasis. What are the signs or symptoms? An attack of acute gout happens quickly. It usually occurs in just one joint. The most common place is the big toe. Attacks often start at night. Other joints that may be affected include joints of the feet, ankle, knee, fingers, wrist, or elbow. Symptoms of this condition may include: Severe pain. Warmth. Swelling. Stiffness. Tenderness. The affected joint may be very painful to touch. Shiny, red, or purple skin. Chills and fever. Chronic gout may cause symptoms more frequently. More joints may be involved. You may also have white or yellow lumps (tophi) on your hands or feet or in other areas near your joints. How is this diagnosed? This condition is diagnosed based on your symptoms, medical history, and physical exam. You may have tests, such as: Blood tests to measure uric acid levels. Removal of joint fluid with a thin needle (aspiration) to look for uric acid crystals. X-rays to look for joint damage. How is this treated? Treatment for this condition has two phases: treating an acute attack and preventing future attacks. Acute gout treatment may include medicines to reduce pain and swelling, including: NSAIDs. Steroids. These are strong anti-inflammatory medicines that can be taken by mouth (orally) or injected into a joint. Colchicine. This medicine relieves pain and swelling when it is taken soon after an attack. It can be given by mouth or through an IV. Preventive treatment may include: Daily use of smaller doses of NSAIDs or colchicine. Use of a medicine that reduces uric acid levels in your blood. Changes to your diet. You may need to see a dietitian about what to eat and drink to prevent gout. Follow these instructions at home: During a gout attack  If directed, put ice on the affected area: Put ice in a plastic bag. Place a  towel between your skin and the bag. Leave the ice on for 20 minutes, 2-3 times a day. Raise (elevate) the affected joint above the level of your heart as often as possible. Rest the joint as much as possible. If the affected joint is in your leg, you may be given crutches to use. Follow instructions from your health care provider about eating or drinking restrictions. Avoiding future gout attacks Follow a low-purine diet as told by your dietitian or health care provider. Avoid foods and drinks that are high in purines, including liver, kidney, anchovies, asparagus, herring, mushrooms, mussels, and beer. Maintain a healthy weight or lose weight if you are overweight. If you want to lose weight, talk with your health care provider. It is important that you do not lose weight too quickly. Start or maintain an exercise program as told by your health care provider. Eating  and drinking Drink enough fluids to keep your urine pale yellow. If you drink alcohol: Limit how much you use to: 0-1 drink a day for women. 0-2 drinks a day for men. Be aware of how much alcohol is in your drink. In the [place] one drink equals one 12 oz bottle of beer (355 mL) one 5 oz glass of wine (148 mL), or one 1 oz glass of hard liquor (44 mL). General instructions Take over-the-counter and prescription medicines only as told by your health care provider. Do not drive or use heavy machinery while taking prescription pain medicine. Return to your normal activities as told by your health care provider. Ask your health care provider what activities are safe for you. Keep all follow-up visits as told by your health care provider. This is important. Contact a health care provider if you have: Another gout attack. Continuing symptoms of a gout attack after 10 days of treatment. Side effects from your medicines. Chills or a fever. Burning pain when you urinate. Pain in your lower back or belly. Get help right away if  you: Have severe or uncontrolled pain. Cannot urinate. Summary Gout is painful swelling of the joints caused by inflammation. The most common site of pain is the big toe, but it can affect other joints in the body. Medicines and dietary changes can help to prevent and treat gout attacks. This information is not intended to replace advice given to you by your health care provider. Make sure you discuss any questions you have with your health care provider. Document Revised: 05/01/2018 Document Reviewed: 05/01/2018 Elsevier Patient Education  2022 [employer].

## 2021-07-31 ENCOUNTER — Other Ambulatory Visit: Payer: Self-pay | Admitting: Internal Medicine

## 2021-07-31 DIAGNOSIS — E119 Type 2 diabetes mellitus without complications: Secondary | ICD-10-CM

## 2021-07-31 DIAGNOSIS — K219 Gastro-esophageal reflux disease without esophagitis: Secondary | ICD-10-CM

## 2021-07-31 NOTE — Telephone Encounter (Signed)
Requested medication (s) are due for refill today: no  Requested medication (s) are on the active medication list: yes  Last refill:  07/04/21 #30 3 RF  Future visit scheduled: yes  Notes to clinic:  pt requesting 90 day refills   Requested Prescriptions  Pending Prescriptions Disp Refills   citalopram (CELEXA) 10 MG tablet [Pharmacy Med Name: CITALOPRAM HBR 10 MG TABLET] 90 tablet 2    Sig: TAKE 1 TABLET BY MOUTH EVERY DAY     Psychiatry:  Antidepressants - SSRI Passed - 07/31/2021  8:32 AM      Passed - Valid encounter within last 6 months    Recent Outpatient Visits           3 days ago Screening for osteoporosis   [facility] Vigg, Avanti, MD   1 week ago Foot pain, left   [facility] Vigg, Avanti, MD   3 weeks ago Diabetes mellitus without complication (HCC)   [facility] Vigg, Avanti, MD       Future Appointments             In 2 months Vigg, Avanti, MD [facility]   In 2 months Vigg, Avanti, MD [facility]

## 2021-08-03 ENCOUNTER — Ambulatory Visit: Payer: Medicare Other | Admitting: Podiatry

## 2021-08-03 ENCOUNTER — Other Ambulatory Visit: Payer: Self-pay

## 2021-08-03 ENCOUNTER — Encounter: Payer: Self-pay | Admitting: Podiatry

## 2021-08-03 DIAGNOSIS — M79674 Pain in right toe(s): Secondary | ICD-10-CM

## 2021-08-03 DIAGNOSIS — B351 Tinea unguium: Secondary | ICD-10-CM | POA: Diagnosis not present

## 2021-08-03 DIAGNOSIS — M79675 Pain in left toe(s): Secondary | ICD-10-CM

## 2021-08-03 DIAGNOSIS — E119 Type 2 diabetes mellitus without complications: Secondary | ICD-10-CM | POA: Diagnosis not present

## 2021-08-04 NOTE — Progress Notes (Signed)
  Subjective:  Patient ID: Vanessa Hogan, female    DOB: [birth year],  MRN: [id number]  Chief Complaint  Patient presents with   Foot Swelling     New pt-Left foot was swollen PCP referring to podiatry   Diabetes    Diabetic foot care, A1C  6.7   Gout    80 y.o. female presents with the above complaint. History confirmed with patient.  She has a history of gout her PCP has done very well to get this under control and she no longer has any pain and issues from swelling with it.  The nails are thickened elongated she has difficulty cutting them.  A1c used to be 15 and is now down to 6.7% with significant diet changes  Objective:  Physical Exam: warm, good capillary refill, no trophic changes or ulcerative lesions, normal DP and PT pulses, and some peripheral neuropathy Left Foot: dystrophic yellowed discolored nail plates with subungual debris Right Foot: dystrophic yellowed discolored nail plates with subungual debris  Assessment:   1. Pain due to onychomycosis of toenails of both feet   2. Diabetes mellitus without complication (HCC)      Plan:  Patient was evaluated and treated and all questions answered.  Patient educated on diabetes. Discussed proper diabetic foot care and discussed risks and complications of disease. Educated patient in depth on reasons to return to the office immediately should he/she discover anything concerning or new on the feet. All questions answered. Discussed proper shoes as well.   Discussed the etiology and treatment options for the condition in detail with the patient. Educated patient on the topical and oral treatment options for mycotic nails. Recommended debridement of the nails today. Sharp and mechanical debridement performed of all painful and mycotic nails today. Nails debrided in length and thickness using a nail nipper to level of comfort. Discussed treatment options including appropriate shoe gear. Follow up as needed for painful  nails.    Return in about 3 months (around 11/03/2021) for at risk diabetic foot care.

## 2021-08-13 ENCOUNTER — Other Ambulatory Visit: Payer: Self-pay

## 2021-08-13 ENCOUNTER — Emergency Department: Payer: Medicare Other

## 2021-08-13 ENCOUNTER — Encounter: Payer: Self-pay | Admitting: Emergency Medicine

## 2021-08-13 ENCOUNTER — Other Ambulatory Visit: Payer: Self-pay | Admitting: Internal Medicine

## 2021-08-13 ENCOUNTER — Emergency Department
Admission: EM | Admit: 2021-08-13 | Discharge: 2021-08-13 | Disposition: A | Discharge status: Home / Self Care | Payer: Medicare Other | Attending: Emergency Medicine | Admitting: Emergency Medicine

## 2021-08-13 DIAGNOSIS — Z87891 Personal history of nicotine dependence: Secondary | ICD-10-CM | POA: Diagnosis not present

## 2021-08-13 DIAGNOSIS — Z794 Long term (current) use of insulin: Secondary | ICD-10-CM | POA: Diagnosis not present

## 2021-08-13 DIAGNOSIS — I10 Essential (primary) hypertension: Secondary | ICD-10-CM | POA: Insufficient documentation

## 2021-08-13 DIAGNOSIS — R079 Chest pain, unspecified: Secondary | ICD-10-CM | POA: Diagnosis present

## 2021-08-13 DIAGNOSIS — E119 Type 2 diabetes mellitus without complications: Secondary | ICD-10-CM | POA: Diagnosis not present

## 2021-08-13 DIAGNOSIS — M62838 Other muscle spasm: Secondary | ICD-10-CM | POA: Insufficient documentation

## 2021-08-13 DIAGNOSIS — I4510 Unspecified right bundle-branch block: Secondary | ICD-10-CM | POA: Diagnosis not present

## 2021-08-13 HISTORY — DX: Gout, unspecified: M10.9

## 2021-08-13 LAB — BASIC METABOLIC PANEL
Anion gap: 10 (ref 5–15)
BUN: 10 mg/dL (ref 8–23)
CO2: 26 mmol/L (ref 22–32)
Calcium: 9 mg/dL (ref 8.9–10.3)
Chloride: 103 mmol/L (ref 98–111)
Creatinine, Ser: 0.88 mg/dL (ref 0.44–1.00)
GFR, Estimated: >60 mL/min (ref >60)
Glucose, Bld: 111 mg/dL — ABNORMAL HIGH (ref 70–99)
Potassium: 3.5 mmol/L (ref 3.5–5.1)
Sodium: 139 mmol/L (ref 135–145)

## 2021-08-13 LAB — CBC
HCT: 44.8 % (ref 36.0–46.0)
Hemoglobin: 14.8 g/dL (ref 12.0–15.0)
MCH: 25 pg — ABNORMAL LOW (ref 26.0–34.0)
MCHC: 33 g/dL (ref 30.0–36.0)
MCV: 75.8 fL — ABNORMAL LOW (ref 80.0–100.0)
Platelets: 220 10*3/uL (ref 150–400)
RBC: 5.91 MIL/uL — ABNORMAL HIGH (ref 3.87–5.11)
RDW: 16.3 % — ABNORMAL HIGH (ref 11.5–15.5)
WBC: 4.3 10*3/uL (ref 4.0–10.5)
nRBC: 0 % (ref 0.0–0.2)

## 2021-08-13 LAB — TROPONIN I (HIGH SENSITIVITY)
Troponin I (High Sensitivity): 9 ng/L (ref <18)
Troponin I (High Sensitivity): 10 ng/L (ref <18)

## 2021-08-13 NOTE — Telephone Encounter (Signed)
last RF 07/22/21 #30 1 RF

## 2021-08-13 NOTE — ED Provider Notes (Signed)
[facility] Emergency Department Provider Note   ____________________________________________   Event Date/Time   First MD Initiated Contact with Patient 08/13/21 1318     (approximate)  I have reviewed the triage vital signs and the nursing notes.   HISTORY  Chief Complaint Chest Pain    HPI Vanessa Hogan is a 80 y.o. female who reports she was walking her dog and developed pain in the right upper chest.  It felt like a muscle spasm and was tight.  She reports it was worse with position change or walking.  Deep breathing could make it worse as well.  She rested for a while and after maybe half an hour or so it seems to have gone away.  Is not there now.  She has never had this before.  She is a diabetic and hypertensive.  She is a former smoker as well.  Pain is completely gone now nothing brings it back.  Lasted over an hour.        Past Medical History:  Diagnosis Date   Anxiety    Depression    Diabetes mellitus without complication (HCC)    GERD (gastroesophageal reflux disease)    Gout    Hypertension     Patient Active Problem List   Diagnosis Date Noted   Foot pain, left 07/22/2021   Diabetes mellitus without complication (HCC) 07/21/2021   Gastroesophageal reflux disease 07/21/2021    Past Surgical History:  Procedure Laterality Date   ABDOMINAL HYSTERECTOMY     BREAST SURGERY     HERNIA REPAIR     TUBAL LIGATION      Prior to Admission medications   Medication Sig Start Date End Date Taking? Authorizing Provider  citalopram (CELEXA) 10 MG tablet TAKE 1 TABLET BY MOUTH EVERY DAY 08/02/21   Vigg, Avanti, MD  cloNIDine (CATAPRES) 0.2 MG tablet Take 0.2 mg by mouth 2 (two) times daily. 05/11/21   [provider]  colchicine 0.6 MG tablet Take 1 tablet (0.6 mg total) by mouth daily. 07/22/21   Vigg, Avanti, MD  GLOBAL EASE INJECT PEN NEEDLES 32G X 4 MM MISC SMARTSIG:1 needle SUB-Q 3 Times Daily 01/17/21   [provider]  HUMALOG MIX 75/25 KWIKPEN (75-25) 100 UNIT/ML KwikPen Inject into the skin. 06/25/21   [provider]  insulin degludec (TRESIBA) 100 UNIT/ML FlexTouch Pen Inject 20 Units into the skin at bedtime. 07/04/21   Vigg, Avanti, MD  loratadine (CLARITIN) 10 MG tablet Take 10 mg by mouth daily. 04/13/21   [provider]  methylPREDNISolone (MEDROL DOSEPAK) 4 MG TBPK tablet Use as directed 07/22/21   Vigg, Avanti, MD  omeprazole (PRILOSEC) 20 MG capsule Take 20 mg by mouth daily. 05/10/21   [provider]  OZEMPIC, 0.25 OR 0.5 MG/DOSE, 2 MG/1.5ML SOPN Inject 0.5 mg into the skin once a week. 06/29/21   [provider]  terbinafine (LAMISIL) 250 MG tablet Take 1 tablet (250 mg total) by mouth daily. 07/21/21   Vigg, Avanti, MD  valsartan-hydrochlorothiazide (DIOVAN-HCT) 160-12.5 MG tablet Take 1 tablet by mouth daily. 06/10/21   [provider]    Allergies Asa [aspirin], Morphine and related, Nitroglycerin, Penicillins, and Sertraline  Family History  Problem Relation Age of Onset   Stroke Mother    Stroke Father    Dementia Sister    Hypertension Brother    Diabetes Daughter    Hypertension Son     Social History Social History  Tobacco Use   Smoking status: Former    Types: Cigarettes   Smokeless tobacco: Former  Building services engineer Use: Never used  Substance Use Topics   Alcohol use: Yes    Alcohol/week: 1.0 standard drink    Types: 1 Glasses of wine per week   Drug use: Never    Review of Systems Currently Constitutional: No fever/chills Eyes: No visual changes. ENT: No sore throat. Cardiovascular: Denies chest pain. Respiratory: Denies shortness of breath. Gastrointestinal: No abdominal pain.  No nausea, no vomiting.  No diarrhea.  No constipation. Genitourinary: Negative for dysuria. Musculoskeletal: Negative for back pain. Skin: Negative for rash. Neurological: Negative for headaches, focal weakness  or  ____________________________________________   PHYSICAL EXAM:  VITAL SIGNS: ED Triage Vitals  Enc Vitals Group     BP 08/13/21 0943 (!) 151/80     Pulse Rate 08/13/21 0943 79     Resp 08/13/21 0943 14     Temp 08/13/21 0943 98.2 F (36.8 C)     Temp Source 08/13/21 0943 Oral     SpO2 08/13/21 0943 96 %     Weight 08/13/21 0941 205 lb 9.6 oz (93.3 kg)     Height 08/13/21 0941 5' 5.75" (1.67 m)     Head Circumference --      Peak Flow --      Pain Score 08/13/21 0941 3     Pain Loc --      Pain Edu? --      Excl. in GC? --    Constitutional: Alert and oriented. Well appearing and in no acute distress. Eyes: Conjunctivae are normal.  Head: Atraumatic. Nose: No congestion/rhinnorhea. Mouth/Throat: Mucous membranes are moist.  Oropharynx non-erythematous. Neck: No stridor. Cardiovascular: Normal rate, regular rhythm. Grossly normal heart sounds.  Good peripheral circulation. Respiratory: Normal respiratory effort.  No retractions. Lungs CTAB.  No tenderness to palpation in the chest Gastrointestinal: Soft and nontender. No distention. No abdominal bruits. No CVA tenderness. Musculoskeletal: No lower extremity tenderness nor edema.   Neurologic:  Normal speech and language. No gross focal neurologic deficits are appreciated.  Skin: No rash skin is warm dry and intact  ____________________________________________   LABS (all labs ordered are listed, but only abnormal results are displayed)  Labs Reviewed  BASIC METABOLIC PANEL - Abnormal; Notable for the following components:      Result Value   Glucose, Bld 111 (*)    All other components within normal limits  CBC - Abnormal; Notable for the following components:   RBC 5.91 (*)    MCV 75.8 (*)    MCH 25.0 (*)    RDW 16.3 (*)    All other components within normal limits  TROPONIN I (HIGH SENSITIVITY)  TROPONIN I (HIGH SENSITIVITY)   ____________________________________________  EKG  EKG read interpreted by  me shows normal sinus rhythm rate of 79 left axis first degree block and right bundle branch block but no ST-T changes are seen.  There are no old EKGs. ____________________________________________  RADIOLOGY Jill Poling, personally viewed and evaluated these images (plain radiographs) as part of my medical decision making, as well as reviewing the written report by the radiologist.  ED MD interpretation: Chest x-ray read by radiology reviewed by me is negative.  Official radiology report(s): DG Chest 2 View  Result Date: 08/13/2021 CLINICAL DATA:  80 year old female with right side chest pain EXAM: CHEST - 2 VIEW COMPARISON:  None. FINDINGS: Normal lung volumes. Tortuous thoracic  aorta. Other mediastinal contours are within normal limits. Visualized tracheal air column is within normal limits. Lung markings are within normal limits. No pneumothorax, pulmonary edema, pleural effusion or confluent pulmonary opacity. Mild thoracic kyphoscoliosis. No acute osseous abnormality identified. Negative visible bowel gas. IMPRESSION: No acute cardiopulmonary abnormality. Electronically Signed   By: Odessa Fleming M.D.   On: 08/13/2021 10:20    ____________________________________________   PROCEDURES  Procedure(s) performed (including Critical Care):  Procedures   ____________________________________________   INITIAL IMPRESSION / ASSESSMENT AND PLAN / ED COURSE  Patient with chest pain that developed while she was walking but it did not resolve with rest.  Her EKGs and troponin have been negative.  Pain is now gone.  She describes it as muscle spasm in the right upper chest.  Within normal EKG and troponins after an hour of chest pain and normal chest x-ray and her description of the pain is sounds like this was musculoskeletal.  I will have her follow-up with cardiology anyway just because of her age.  I will diagnose her as nonspecific chest pain.  She will return if she is having any further  problems and will follow up with cardiology.             ____________________________________________   FINAL CLINICAL IMPRESSION(S) / ED DIAGNOSES  Final diagnoses:  Nonspecific chest pain     ED Discharge Orders     None        Note:  This document was prepared using Dragon voice recognition software and may include unintentional dictation errors.    Arnaldo Natal, MD 08/13/21 [phone]

## 2021-08-13 NOTE — ED Triage Notes (Signed)
Pt in via EMS from home with c/o CP with moving. Pt was walking her dog when it started, Pt with hx of anxiety and took her lorazepam which helped. #20g to right ac, allergic to asa. FSBS 145, HR 86, 145/81, 95% RA

## 2021-08-13 NOTE — Discharge Instructions (Signed)
The EKG chest x-ray and troponins that we did today look good.  This makes it much less likely the pain was your heart.  Just in case I will have you follow-up with cardiology.  Dr. Mariah Milling is on-call.  You can call his office Monday morning let them know you were in the emergency room and the emergency medicine doctor wanted you to have rapid follow-up.  They should be out of range that quickly for you.  Please return for any worse pain shortness of breath nausea sweating or any other problems.

## 2021-08-25 ENCOUNTER — Ambulatory Visit: Payer: Medicare Other | Admitting: Cardiology

## 2021-08-25 ENCOUNTER — Encounter: Payer: Self-pay | Admitting: Cardiology

## 2021-08-25 ENCOUNTER — Other Ambulatory Visit: Payer: Self-pay

## 2021-08-25 VITALS — BP 160/82 | HR 95 | Ht 65.5 in | Wt 207.4 lb

## 2021-08-25 DIAGNOSIS — R079 Chest pain, unspecified: Secondary | ICD-10-CM | POA: Diagnosis not present

## 2021-08-25 DIAGNOSIS — R0789 Other chest pain: Secondary | ICD-10-CM

## 2021-08-25 DIAGNOSIS — I10 Essential (primary) hypertension: Secondary | ICD-10-CM

## 2021-08-25 DIAGNOSIS — E119 Type 2 diabetes mellitus without complications: Secondary | ICD-10-CM

## 2021-08-25 NOTE — Assessment & Plan Note (Signed)
She describes having this right-sided upper chest pain intermittently at random intervals for several years now.  She said that her last stress test was a treadmill nuclear stress test done about a year and a half ago while still living in [place].  Before that she had had at least one of the treadmill stress test in a couple Lexiscan/adenosine nuclear stress test.  These test were done for the very same symptoms that she is here today.  At this point I do not think that she needs any further testing.  She is not having any more chest pain since the episode in the hospital, and would not expect her to have a different change in her Myoview on the symptoms actually progressed from before.  I would like to reassess her in about 2 to 3 months and see how she is doing.

## 2021-08-25 NOTE — Assessment & Plan Note (Signed)
Blood pressures not at goal today.  Could very well be related to stress.  She got lost getting here to the clinic and then could not find her way down to where the clinic is.  She is very anxious about being late.  She is on clonidine which is somewhat concerning, but she said this was the only one that she was able to tolerate on the valsartan.  I will follow-up with her in January and reassess her blood pressures.  If still elevated, we do have an increase valsartan-HCTZ.  Also determine what other medicines have been tried.  She has a list of medicines and also has last clinic notes.

## 2021-08-25 NOTE — Patient Instructions (Signed)
Medication Instructions:  No changes at this time.  *If you need a refill on your cardiac medications before your next appointment, please call your pharmacy*   Lab Work: None  If you have labs (blood work) drawn today and your tests are completely normal, you will receive your results only by: MyChart Message (if you have MyChart) OR A paper copy in the mail If you have any lab test that is abnormal or we need to change your treatment, we will call you to review the results.   Testing/Procedures: None   Follow-Up: At [facility], you and your health needs are our priority.  As part of our continuing mission to provide you with exceptional heart care, we have created designated Provider Care Teams.  These Care Teams include your primary Cardiologist (physician) and Advanced Practice Providers (APPs -  Physician Assistants and Nurse Practitioners) who all work together to provide you with the care you need, when you need it.  We recommend signing up for the patient portal called "MyChart".  Sign up information is provided on this After Visit Summary.  MyChart is used to connect with patients for Virtual Visits (Telemedicine).  Patients are able to view lab/test results, encounter notes, upcoming appointments, etc.  Non-urgent messages can be sent to your provider as well.   To learn more about what you can do with MyChart, go to [email or web address].    Your next appointment:   Follow up in January.   The format for your next appointment:   In Person  Provider:   You may see Dr. Bryan Lemma or one of the following Advanced Practice Providers on your designated Care Team:   Nicolasa Ducking, NP Eula Listen, PA-C Marisue Ivan, PA-C Cadence [place], [place]

## 2021-08-25 NOTE — Assessment & Plan Note (Signed)
Better controlled now.  A1c has been over 11, but is currently now down to 6.7.  She is very happy with this.  Discussed weight loss and diet/exercise.   Lipids managed by PCP.

## 2021-08-25 NOTE — Progress Notes (Signed)
Primary Care Provider: Loura Pardon, MD Cardiologist: None Electrophysiologist: None  Clinic Note: Chief Complaint  Patient presents with   New Patient (Initial Visit)    Ref by Dr. Charlotta Hogan for chest pain; patient was at [facility] ER with chest pain and continues to have chest tightness as well as some shortness of breath. Medications reviewed by the patient verbally.    ===================================  ASSESSMENT/PLAN   Problem List Items Addressed This Visit       Cardiology Problems   Essential hypertension (Chronic)    Blood pressures not at goal today.  Could very well be related to stress.  She got lost getting here to the clinic and then could not find her way down to where the clinic is.  She is very anxious about being late.  She is on clonidine which is somewhat concerning, but she said this was the only one that she was able to tolerate on the valsartan.  I will follow-up with her in January and reassess her blood pressures.  If still elevated, we do have an increase valsartan-HCTZ.  Also determine what other medicines have been tried.  She has a list of medicines and also has last clinic notes.        Other   Intermittent right-sided chest pain (Chronic)    She describes having this right-sided upper chest pain intermittently at random intervals for several years now.  She said that her last stress test was a treadmill nuclear stress test done about a year and a half ago while still living in [place].  Before that she had had at least one of the treadmill stress test in a couple Lexiscan/adenosine nuclear stress test.  These test were done for the very same symptoms that she is here today.  At this point I do not think that she needs any further testing.  She is not having any more chest pain since the episode in the hospital, and would not expect her to have a different change in her Myoview on the symptoms actually progressed from before.  I would like to  reassess her in about 2 to 3 months and see how she is doing.      Diabetes mellitus without complication (HCC) (Chronic)    Better controlled now.  A1c has been over 11, but is currently now down to 6.7.  She is very happy with this.  Discussed weight loss and diet/exercise.   Lipids managed by PCP.      Other Visit Diagnoses     Chest pain of uncertain etiology    -  Primary   Relevant Orders   EKG 12-Lead       ===================================  HPI:    Vanessa Hogan is a 80 y.o. female former smoker with a PMH notable for HYPERTENSION, HYPERLIPIDEMIA AND DIABETES MELLITUS TYPE 2 who is being seen today for the evaluation of CHEST PAIN AND SHORTNESS OF BREATH at the request of Vigg, Avanti, MD & Vanessa Natal, MD.  Vanessa Hogan was referred by her PCP following ER evaluation for right-sided chest pain.  There is also concern about difficulty manage blood pressures with an elderly woman on clonidine.  Recent Hospitalizations:  [facility] ER 08/13/2021: Developed right upper chest pain while walking her dog.  Worse with position change or walking.  Also made worse with deep inspiration.  Went away after about half an hour rest.  Normal EKG, negative troponin.  Recommendation was follow-up with cardiology.  Reviewed  CV studies:    The following studies were reviewed today: (if available, images/films reviewed: From Epic Chart or Care Everywhere) None currently available.  By patient report, last stress test was a year and a half ago.:   Interval History:   Vanessa Hogan presents here today for evaluation of her right upper chest chest pain that she has had intermittently for the last several years.  She also has well-controlled hypertension and has been intolerant of most medications.  The chest pain she feels-which is what she had when she went to the emergency room-is in the right upper chest almost at the axillary line and it feels like a  cramping and pulling type sensation.  This occurs with and without exertion, it does not seem to be exacerbated by walking.  The episode that occurred taking her to the emergency room actually occurred before she walked her dog and did not get worse or better while walking the dog.  She said that she got scared because she saw a large bug in the living room right before she was on a walk.  She did not try to kill the bug he got very anxious.  While doing this, she had chest pain.  She still walked her dog, but still had a little bit there when she got back.  She therefore chose to go to the hospital.  Upon arrival to the hospital pain was gone.  She says that these episodes come and go intermittently throughout the course of the month maybe couple times a month may be couple times a week and are not associated with any rest or exertion.  She tells me that these episodes have been evaluated intermittently for the last several years with the most recent stress test (Treadmill Myoview which was her second treadmill stress test whereas the others have been chemical) that was done about a year and a half ago while she was still living in [place] ([place], [place]).  She moved to [place] just over a year ago, and has not been evaluated by cardiologist since that time.  All she knows is that the same symptom has been evaluated by multiple physicians using multiple stress tests and nothing is shown anything.  She has mild exertional dyspnea at baseline indicating that she is deconditioned.  Minimal edema.  She says that her blood pressures at not been well controlled for some time.  She is tried several different medications  CV Review of Symptoms (Summary) Cardiov ascular ROS: positive for - -intermittent episodes of right upper chest pain that feels like a spasm or cramp.  Has not had any since hospitalization.  Mild baseline exertional dyspnea-deconditioned. Trivial e with foot elevationBy the morning.nd of  day swelling.Goes down.  Very rare palpitations.  Nothing prolonged negative for - edema, orthopnea, paroxysmal nocturnal dyspnea, rapid heart rate, shortness of breath, or lightheadedness, dizziness or wooziness.  Syncope/near syncope or TIA/amaurosis fugax.  Claudication  REVIEWED OF SYSTEMS   Review of Systems  Constitutional:  Negative for malaise/fatigue (Less get up and go than she used to have.) and weight loss.  HENT:  Negative for congestion and nosebleeds.   Respiratory: Negative.    Cardiovascular:        Per HPI  Gastrointestinal:  Positive for constipation (Off and on) and heartburn. Negative for blood in stool and melena.  Genitourinary:  Negative for hematuria.  Musculoskeletal:  Positive for myalgias. Negative for falls and joint pain.  Neurological:  Negative for  dizziness (Sometimes when she stands up quickly), focal weakness, weakness and headaches.  Psychiatric/Behavioral:  Negative for depression and memory loss. The patient is not nervous/anxious and does not have insomnia.    I have reviewed and (if needed) personally updated the patient's problem list, medications, allergies, past medical and surgical history, social and family history.   PAST MEDICAL HISTORY   Past Medical History:  Diagnosis Date   Anxiety    Depression    Diabetes mellitus without complication (HCC)    GERD (gastroesophageal reflux disease)    Gout    Hypertension     PAST SURGICAL HISTORY   Past Surgical History:  Procedure Laterality Date   ABDOMINAL HYSTERECTOMY     BREAST SURGERY     HERNIA REPAIR     TUBAL LIGATION     Per patient report: She has had both treadmill and Lexiscan/adenosine Myoview stress test in the past.  Most recent Myoview was done over a year and a half ago.  This is when she was living in [place], [place]  Immunization History  Administered Date(s) Administered   [employer] Vaccination 01/29/2020, 02/26/2020   Moderna Sars-Covid-2  Vaccination 01/29/2020, 02/26/2020, 11/18/2020, 06/06/2021    MEDICATIONS/ALLERGIES   Current Meds  Medication Sig   cloNIDine (CATAPRES) 0.2 MG tablet Take 0.2 mg by mouth 2 (two) times daily.   colchicine 0.6 MG tablet Take 1 tablet (0.6 mg total) by mouth daily.   GLOBAL EASE INJECT PEN NEEDLES 32G X 4 MM MISC SMARTSIG:1 needle SUB-Q 3 Times Daily   HUMALOG MIX 75/25 KWIKPEN (75-25) 100 UNIT/ML KwikPen Inject into the skin.   insulin degludec (TRESIBA) 100 UNIT/ML FlexTouch Pen Inject 20 Units into the skin at bedtime.   loratadine (CLARITIN) 10 MG tablet Take 10 mg by mouth daily.   LORazepam (ATIVAN) 0.5 MG tablet Take 0.5 mg by mouth as needed for anxiety.   methylPREDNISolone (MEDROL DOSEPAK) 4 MG TBPK tablet Use as directed   omeprazole (PRILOSEC) 20 MG capsule Take 20 mg by mouth daily.   OZEMPIC, 0.25 OR 0.5 MG/DOSE, 2 MG/1.5ML SOPN Inject 0.5 mg into the skin once a week.   terbinafine (LAMISIL) 250 MG tablet Take 1 tablet (250 mg total) by mouth daily.   valsartan-hydrochlorothiazide (DIOVAN-HCT) 160-12.5 MG tablet Take 1 tablet by mouth daily.    Allergies  Allergen Reactions   Asa [Aspirin]    Morphine And Related    Nitroglycerin Other (See Comments)    Headache   Penicillins    Sertraline Other (See Comments)    Felt poorly    SOCIAL HISTORY/FAMILY HISTORY   Reviewed in Epic:  Pertinent findings:  Social History   Tobacco Use   Smoking status: Former    Packs/day: 0.25    Years: 5.00    Pack years: 1.25    Types: Cigarettes   Smokeless tobacco: Former  Scientific laboratory technician Use: Never used  Substance Use Topics   Alcohol use: Yes    Alcohol/week: 1.0 standard drink    Types: 1 Glasses of wine per week   Drug use: Never   Social History   Social History Narrative   Not on file    OBJCTIVE -PE, EKG, labs   Wt Readings from Last 3 Encounters:  08/25/21 207 lb 6 oz (94.1 kg)  08/13/21 205 lb 9.6 oz (93.3 kg)  07/28/21 205 lb 9.6 oz (93.3 kg)     Physical Exam: BP (!) 160/82 (BP Location: Right Arm,  Patient Position: Sitting, Cuff Size: Normal)   Pulse 95   Ht 5' 5.5" (1.664 m)   Wt 207 lb 6 oz (94.1 kg)   SpO2 98%   BMI 33.98 kg/m  Physical Exam Vitals reviewed.  Constitutional:      General: She is not in acute distress.    Appearance: Normal appearance. She is obese. She is not ill-appearing or toxic-appearing.  HENT:     Head: Normocephalic and atraumatic.  Neck:     Vascular: No carotid bruit or JVD.  Cardiovascular:     Rate and Rhythm: Normal rate and regular rhythm. Occasional Extrasystoles are present.    Chest Wall: PMI is not displaced (Unable to assess).     Pulses: Intact distal pulses.     Heart sounds: Heart sounds are distant. Murmur (Soft 1/6 SEM at RUSB.) heard.    No friction rub. Gallop present. S4 (Cannot exclude S4 gallop versus s2.) sounds present.     Comments: Normal S1 with split S2. Pulmonary:     Effort: Pulmonary effort is normal. No respiratory distress.     Breath sounds: Normal breath sounds. No stridor. No wheezing, rhonchi or rales.  Chest:     Chest wall: No tenderness.  Abdominal:     General: Abdomen is flat. Bowel sounds are normal. There is no distension.     Tenderness: There is no abdominal tenderness.  Musculoskeletal:        General: No swelling.     Cervical back: Normal range of motion and neck supple.  Skin:    Coloration: Skin is not jaundiced or pale.  Neurological:     General: No focal deficit present.     Mental Status: She is alert and oriented to person, place, and time.     Motor: No weakness.  Psychiatric:        Mood and Affect: Mood normal.        Behavior: Behavior normal.        Thought Content: Thought content normal.        Judgment: Judgment normal.     Adult ECG Report  Rate: 95 ;  Rhythm: normal sinus rhythm and 1  AVB.  Left axis deviation-LAFB -> -47 .  RBBB; borderline trifascicular block. ;   Narrative Interpretation: Stable  Recent  Labs: Stable, reviewed No results found for: CHOL, HDL, LDLCALC, LDLDIRECT, TRIG, CHOLHDL Lab Results  Component Value Date   CREATININE 0.88 08/13/2021   BUN 10 08/13/2021   NA 139 08/13/2021   K 3.5 08/13/2021   CL 103 08/13/2021   CO2 26 08/13/2021   CBC Latest Ref Rng & Units 08/13/2021 07/21/2021  WBC 4.0 - 10.5 K/uL 4.3 5.6  Hemoglobin 12.0 - 15.0 g/dL 14.8 14.2  Hematocrit 36.0 - 46.0 % 44.8 44.7  Platelets 150 - 400 K/uL 220 247    Lab Results  Component Value Date   HGBA1C 6.7 (H) 07/04/2021   No results found for: TSH  ==================================================  COVID-19 Education: The signs and symptoms of COVID-19 were discussed with the patient and how to seek care for testing (follow up with PCP or arrange E-visit).    I spent a total of 24 minutes with the patient spent in direct patient consultation.  Additional time spent with chart review  / charting (studies, outside notes, etc): 28 min Total Time: 52 min  Current medicines are reviewed at length with the patient today.  (+/- concerns) none  This visit occurred during the  SARS-CoV-2 public health emergency.  Safety protocols were in place, including screening questions prior to the visit, additional usage of staff PPE, and extensive cleaning of exam room while observing appropriate contact time as indicated for disinfecting solutions.  Notice: This dictation was prepared with Dragon dictation along with smart phrase technology. Any transcriptional errors that result from this process are unintentional and may not be corrected upon review.  Patient Instructions / Medication Changes & Studies & Tests Ordered   Patient Instructions  Medication Instructions:  No changes at this time.  *If you need a refill on your cardiac medications before your next appointment, please call your pharmacy*   Lab Work: None  If you have labs (blood work) drawn today and your tests are completely normal, you will  receive your results only by: [facility] (if you have MyChart) OR A paper copy in the mail If you have any lab test that is abnormal or we need to change your treatment, we will call you to review the results.   Testing/Procedures: None   Follow-Up: At [facility], you and your health needs are our priority.  As part of our continuing mission to provide you with exceptional heart care, we have created designated Provider Care Teams.  These Care Teams include your primary Cardiologist (physician) and Advanced Practice Providers (APPs -  Physician Assistants and Nurse Practitioners) who all work together to provide you with the care you need, when you need it.  We recommend signing up for the patient portal called "MyChart".  Sign up information is provided on this After Visit Summary.  MyChart is used to connect with patients for Virtual Visits (Telemedicine).  Patients are able to view lab/test results, encounter notes, upcoming appointments, etc.  Non-urgent messages can be sent to your provider as well.   To learn more about what you can do with MyChart, go to [email or web address].    Your next appointment:   Follow up in January.   The format for your next appointment:   In Person  Provider:   You may see Dr. Glenetta Hew or one of the following Advanced Practice Providers on your designated Care Team:   Murray Hodgkins, NP Christell Faith, PA-C Marrianne Mood, PA-C Cadence [place], [place]   Studies Ordered:   Orders Placed This Encounter  Procedures   EKG 12-Lead     Glenetta Hew, M.D., M.S. Interventional Cardiologist   Pager # [phone] Phone # [phone] [address] [place], [place] [address]   Thank you for choosing Heartcare at [facility]!!

## 2021-08-31 ENCOUNTER — Emergency Department: Payer: Medicare Other

## 2021-08-31 ENCOUNTER — Other Ambulatory Visit: Payer: Self-pay

## 2021-08-31 ENCOUNTER — Ambulatory Visit: Payer: Self-pay | Admitting: *Deleted

## 2021-08-31 ENCOUNTER — Emergency Department
Admission: EM | Admit: 2021-08-31 | Discharge: 2021-08-31 | Disposition: A | Discharge status: Home / Self Care | Payer: Medicare Other | Attending: Emergency Medicine | Admitting: Emergency Medicine

## 2021-08-31 DIAGNOSIS — I10 Essential (primary) hypertension: Secondary | ICD-10-CM | POA: Diagnosis not present

## 2021-08-31 DIAGNOSIS — Z7984 Long term (current) use of oral hypoglycemic drugs: Secondary | ICD-10-CM | POA: Insufficient documentation

## 2021-08-31 DIAGNOSIS — R42 Dizziness and giddiness: Secondary | ICD-10-CM | POA: Insufficient documentation

## 2021-08-31 DIAGNOSIS — R531 Weakness: Secondary | ICD-10-CM | POA: Diagnosis not present

## 2021-08-31 DIAGNOSIS — Z794 Long term (current) use of insulin: Secondary | ICD-10-CM | POA: Insufficient documentation

## 2021-08-31 DIAGNOSIS — E119 Type 2 diabetes mellitus without complications: Secondary | ICD-10-CM | POA: Insufficient documentation

## 2021-08-31 DIAGNOSIS — Z87891 Personal history of nicotine dependence: Secondary | ICD-10-CM | POA: Diagnosis not present

## 2021-08-31 DIAGNOSIS — Z79899 Other long term (current) drug therapy: Secondary | ICD-10-CM | POA: Insufficient documentation

## 2021-08-31 LAB — CBC
HCT: 46.1 % — ABNORMAL HIGH (ref 36.0–46.0)
Hemoglobin: 14.5 g/dL (ref 12.0–15.0)
MCH: 24 pg — ABNORMAL LOW (ref 26.0–34.0)
MCHC: 31.5 g/dL (ref 30.0–36.0)
MCV: 76.2 fL — ABNORMAL LOW (ref 80.0–100.0)
Platelets: 204 10*3/uL (ref 150–400)
RBC: 6.05 MIL/uL — ABNORMAL HIGH (ref 3.87–5.11)
RDW: 17.6 % — ABNORMAL HIGH (ref 11.5–15.5)
WBC: 4.4 10*3/uL (ref 4.0–10.5)
nRBC: 0 % (ref 0.0–0.2)

## 2021-08-31 LAB — URINALYSIS, ROUTINE W REFLEX MICROSCOPIC
Bilirubin Urine: NEGATIVE
Glucose, UA: NEGATIVE mg/dL
Hgb urine dipstick: NEGATIVE
Ketones, ur: NEGATIVE mg/dL
Nitrite: NEGATIVE
Protein, ur: NEGATIVE mg/dL
Specific Gravity, Urine: 1.004 — ABNORMAL LOW (ref 1.005–1.030)
pH: 6 (ref 5.0–8.0)

## 2021-08-31 LAB — BASIC METABOLIC PANEL
Anion gap: 7 (ref 5–15)
BUN: 11 mg/dL (ref 8–23)
CO2: 30 mmol/L (ref 22–32)
Calcium: 9.1 mg/dL (ref 8.9–10.3)
Chloride: 102 mmol/L (ref 98–111)
Creatinine, Ser: 1.01 mg/dL — ABNORMAL HIGH (ref 0.44–1.00)
GFR, Estimated: 56 mL/min — ABNORMAL LOW (ref >60)
Glucose, Bld: 114 mg/dL — ABNORMAL HIGH (ref 70–99)
Potassium: 3.4 mmol/L — ABNORMAL LOW (ref 3.5–5.1)
Sodium: 139 mmol/L (ref 135–145)

## 2021-08-31 LAB — TROPONIN I (HIGH SENSITIVITY)
Troponin I (High Sensitivity): 6 ng/L (ref <18)
Troponin I (High Sensitivity): 9 ng/L (ref <18)

## 2021-08-31 NOTE — ED Triage Notes (Signed)
Pt to ED [facility] from home for dizziness and chest pain since 10/22. States dizziness worsened today when she stood up from desk. NAD noted.  Referred to cardiologist, no dx

## 2021-08-31 NOTE — Telephone Encounter (Signed)
Pt called in c/o almost passing out after having a sudden dizzy spell that hit her as she was sitting at her desk a few minutes ago.  She is c/o having a "sensation in my head"  "It's hard to explain".   Denies chest pain but does feel activity in her chest that she's been evaluated before in the ED on several occasions during the past.   She fixed herself some hot oatmeal because she was cold.   She also unlocked her door so the EMS could get in in case she had to call 911.       See triage notes.  I instructed her to call 911 and be taken to the hospital since she is still feeling "this sensation in my head".   She is still c/o being slightly lightheaded.   She is ambulating without difficulty.   When I asked her to hold both arms out in front of her and close her eyes for a few seconds to check for arm drift she said that caused her to have pressure in her chest under her breastbone in the center of her chest.   She was agreeable to calling 911 and will go to [facility].  I sent my notes to [facility].

## 2021-08-31 NOTE — Telephone Encounter (Signed)
Reason for Disposition  Sounds like a life-threatening emergency to the triager    Having "sensation in her head" and still feeling slightly lightheaded.  Answer Assessment - Initial Assessment Questions 1. DESCRIPTION: "Describe your dizziness."     Pt calling in.   I'm sitting at my desk and suddenly felt like I'm going to pass out.  I sit there for a few minutes.   I got up and unlocked the door in case I needed to call 911.   I fixed some hot oatmeal and did fine. I went to the ED for pressure in my chest last week.   I've had this before.  No problems with heart.  It's musculoskeletal.    Today feel like I'm going to faint.   No chest pain.   Suddenly it hit me.   I did not faint.  I just wanted to report.   2. LIGHTHEADED: "Do you feel lightheaded?" (e.g., somewhat faint, woozy, weak upon standing)     Never had this sensation before.  I feel activity in my head.   Something's moving in my head.   It's a sensation in my head.  This is the first time this has happened the dizziness.    I tested my glucose it was 95.    I called my daughter also and let her know I wasn't feeling well.  I'm very cold in my back, cold and legs.    I've been cold before so I fixed some oatmeal.    No pain anywhere.  No weakness on one side of her body.    I'm waking up with pains in my arms that wakes me up.   It passes after 15 minutes.   Both arms and not at the same time.  I could move my arms fine but they would just hurt.    During the dizziness no pain.   Just a sensation in head.   I had her look in the mirror and she didn't see any drooping of her face.   I asked her to hold her arms out in front of her and close her eyes for a few seconds.   She said it caused a pressure in her chest under my breast bone in the center.   When I went to the hospital the pressure was more towards my collarbone.    Yesterday I had pain and took a Tum's and the pain went away.   3. VERTIGO: "Do you feel like either you or the  room is spinning or tilting?" (i.e. vertigo)     No 4. SEVERITY: "How bad is it?"  "Do you feel like you are going to faint?" "Can you stand and walk?"   - MILD: Feels slightly dizzy, but walking normally.   - MODERATE: Feels unsteady when walking, but not falling; interferes with normal activities (e.g., school, work).   - SEVERE: Unable to walk without falling, or requires assistance to walk without falling; feels like passing out now.      She is walking around without difficulty. 5. ONSET:  "When did the dizziness begin?"     This morning  Almost every day I feel activity in my chest.   This has been going on for years.   Every time I go to the hospital they can't find anything wrong with my heart.     I have acid reflux.   I was told at the hospital it was musculoskeletal.   I've had  stress tests, scans.   (Her other phone keeps ringing and she is answering it while I'm talking with her). 6. AGGRAVATING FACTORS: "Does anything make it worse?" (e.g., standing, change in head position)     Nothing 7. HEART RATE: "Can you tell me your heart rate?" "How many beats in 15 seconds?"  (Note: not all patients can do this)       Not asked 8. CAUSE: "What do you think is causing the dizziness?"     Not asked at this point I instructed her to call 911 due to the fact she is still feeling "a sensation in her head" and is still feeling lightheaded though not as bad as when it happened. 9. RECURRENT SYMPTOM: "Have you had dizziness before?" If Yes, ask: "When was the last time?" "What happened that time?"     No 10. OTHER SYMPTOMS: "Do you have any other symptoms?" (e.g., fever, chest pain, vomiting, diarrhea, bleeding)       She is c/o of being very cold in my back and legs.  Denies chest pain but does have "activity in her chest" that she has had on and off for years.   See above note. 11. PREGNANCY: "Is there any chance you are pregnant?" "When was your last menstrual period?"       N/A due to  age  Protocols used: Dizziness - Lightheadedness-A-AH

## 2021-08-31 NOTE — Telephone Encounter (Signed)
Noted thnx

## 2021-08-31 NOTE — ED Notes (Signed)
Pt verbalized understanding of discharges. Did not have any further questions for the NP or MD. Verbalized will followup with primary tomorrow and discuss further imaging/diagnostics at that time.

## 2021-08-31 NOTE — ED Notes (Signed)
New green top pulled from new peripheral IV catheter for redraw for BMP.

## 2021-08-31 NOTE — ED Provider Notes (Signed)
[facility] Emergency Department Provider Note ____________________________________________   Event Date/Time   First MD Initiated Contact with Patient 08/31/21 1154     (approximate)  I have reviewed the triage vital signs and the nursing notes.   HISTORY  Chief Complaint Weakness  HPI Vanessa Hogan is a 80 y.o. female with history of hypertension, hyperlipidemia, diabetes type 2 presents to the emergency department for treatment and evaluation after experiencing dizziness while sitting at her desk around 9am this morning.  Patient states that she has not had an episode similar to this in the past.  She has been recently evaluated for chest pain and was told that this is musculoskeletal in nature.  She denies recent nausea, vomiting, or diarrhea and feels that she is well hydrated.     Past Medical History:  Diagnosis Date   Anxiety    Depression    Diabetes mellitus without complication (HCC)    GERD (gastroesophageal reflux disease)    Gout    Hypertension     Patient Active Problem List   Diagnosis Date Noted   Essential hypertension 08/25/2021   Foot pain, left 07/22/2021   Diabetes mellitus without complication (HCC) 07/21/2021   Gastroesophageal reflux disease 07/21/2021   Intermittent right-sided chest pain 2010    Past Surgical History:  Procedure Laterality Date   ABDOMINAL HYSTERECTOMY     BREAST SURGERY     HERNIA REPAIR     TUBAL LIGATION      Prior to Admission medications   Medication Sig Start Date End Date Taking? Authorizing Provider  citalopram (CELEXA) 10 MG tablet TAKE 1 TABLET BY MOUTH EVERY DAY Patient not taking: Reported on 08/25/2021 08/02/21   Loura Pardon, MD  cloNIDine (CATAPRES) 0.2 MG tablet Take 0.2 mg by mouth 2 (two) times daily. 05/11/21   [provider]  colchicine 0.6 MG tablet Take 1 tablet (0.6 mg total) by mouth daily. 07/22/21   Vigg, Avanti, MD  GLOBAL EASE INJECT PEN NEEDLES 32G X  4 MM MISC SMARTSIG:1 needle SUB-Q 3 Times Daily 01/17/21   [provider]  HUMALOG MIX 75/25 KWIKPEN (75-25) 100 UNIT/ML KwikPen Inject into the skin. 06/25/21   [provider]  insulin degludec (TRESIBA) 100 UNIT/ML FlexTouch Pen Inject 20 Units into the skin at bedtime. 07/04/21   Vigg, Avanti, MD  loratadine (CLARITIN) 10 MG tablet Take 10 mg by mouth daily. 04/13/21   [provider]  LORazepam (ATIVAN) 0.5 MG tablet Take 0.5 mg by mouth as needed for anxiety.    [provider]  metFORMIN (GLUCOPHAGE-XR) 500 MG 24 hr tablet Take 500 mg by mouth daily. 08/16/21   [provider]  methylPREDNISolone (MEDROL DOSEPAK) 4 MG TBPK tablet Use as directed 07/22/21   Vigg, Avanti, MD  omeprazole (PRILOSEC) 20 MG capsule Take 20 mg by mouth daily. 05/10/21   [provider]  OZEMPIC, 0.25 OR 0.5 MG/DOSE, 2 MG/1.5ML SOPN Inject 0.5 mg into the skin once a week. 06/29/21   [provider]  terbinafine (LAMISIL) 250 MG tablet Take 1 tablet (250 mg total) by mouth daily. 07/21/21   Vigg, Avanti, MD  valsartan-hydrochlorothiazide (DIOVAN-HCT) 160-12.5 MG tablet Take 1 tablet by mouth daily. 06/10/21   [provider]    Allergies Asa [aspirin], Morphine and related, Nitroglycerin, Penicillins, and Sertraline  Family History  Problem Relation Age of Onset   Stroke Mother    Stroke Father    Dementia Sister  Hypertension Brother    Diabetes Daughter    Hypertension Son     Social History Social History   Tobacco Use   Smoking status: Former    Packs/day: 0.25    Years: 5.00    Pack years: 1.25    Types: Cigarettes   Smokeless tobacco: Former  Building services engineer Use: Never used  Substance Use Topics   Alcohol use: Yes    Alcohol/week: 1.0 standard drink    Types: 1 Glasses of wine per week   Drug use: Never    Review of Systems  Constitutional: No fever/chills Eyes: No visual changes. ENT: No sore  throat. Cardiovascular: Denies chest pain. Respiratory: Denies shortness of breath. Gastrointestinal: No abdominal pain.  No nausea, no vomiting.  No diarrhea.  No constipation. Genitourinary: Negative for dysuria. Musculoskeletal: Negative for back pain. Skin: Negative for rash. Neurological: Negative for headaches, focal weakness or numbness  ____________________________________________   PHYSICAL EXAM:  VITAL SIGNS: ED Triage Vitals [08/31/21 1143]  Enc Vitals Group     BP 148/62     Pul/se 71     Resp 18     Temp 98     Temp src      SpO2 97     Weight 207 lb (93.9 kg)     Height 5' 5.5" (1.664 m)     Head Circumference      Peak Flow      Pain Score 0     Pain Loc      Pain Edu?      Excl. in GC?     Constitutional: Alert and oriented. Well appearing and in no acute distress. Eyes: Conjunctivae are normal. PERRL. EOMI. Head: Atraumatic. Nose: No congestion/rhinnorhea. Mouth/Throat: Mucous membranes are moist. Oropharynx non-erythematous. Neck: No stridor.   Hematological/Lymphatic/Immunilogical: No cervical lymphadenopathy. Cardiovascular: Normal rate, regular rhythm. Grossly normal heart sounds. Good peripheral circulation. No lower extremity edema. No JVD appreciated. Respiratory: Normal respiratory effort.  No retractions. Lungs CTAB. Gastrointestinal: Soft and nontender. No distention. No abdominal bruits. No CVA tenderness. Musculoskeletal: No lower extremity tenderness nor edema.  No joint effusions. Neurologic:  Normal speech and language. No gross focal neurologic deficits are appreciated. No gait instability. Skin:  Skin is warm, dry and intact. No rash noted. Psychiatric: Mood and affect are normal. Speech and behavior are normal.  ____________________________________________   LABS (all labs ordered are listed, but only abnormal results are displayed)  Labs Reviewed  CBC - Abnormal; Notable for the following components:      Result Value   RBC  6.05 (*)    HCT 46.1 (*)    MCV 76.2 (*)    MCH 24.0 (*)    RDW 17.6 (*)    All other components within normal limits  BASIC METABOLIC PANEL - Abnormal; Notable for the following components:   Potassium 3.4 (*)    Glucose, Bld 114 (*)    Creatinine, Ser 1.01 (*)    GFR, Estimated 56 (*)    All other components within normal limits  URINALYSIS, ROUTINE W REFLEX MICROSCOPIC - Abnormal; Notable for the following components:   Color, Urine STRAW (*)    APPearance CLEAR (*)    Specific Gravity, Urine 1.004 (*)    Leukocytes,Ua MODERATE (*)    Bacteria, UA RARE (*)    All other components within normal limits  URINE CULTURE  TROPONIN I (HIGH SENSITIVITY)  TROPONIN I (HIGH SENSITIVITY)   ____________________________________________  EKG  ED  ECG REPORT I, Kem Boroughs, FNP-BC personally viewed and interpreted this ECG.   Date: 08/31/2021  EKG Time: 1149  Rate: 74  Rhythm: normal EKG, normal sinus rhythm, unchanged from previous tracings, RBBB  Axis: normal  Intervals:right bundle branch block  ST&T Change: no ST elevation  ____________________________________________  RADIOLOGY  ED MD interpretation:    CT head without contrast:   I, Sharae Zappulla, personally viewed and evaluated these images (plain radiographs) as part of my medical decision making, as well as reviewing the written report by the radiologist.  Official radiology report(s): DG Chest 2 View  Result Date: 08/31/2021 CLINICAL DATA:  Chest pain EXAM: CHEST - 2 VIEW COMPARISON:  08/13/2021 FINDINGS: The heart size and mediastinal contours are within normal limits. Both lungs are clear. No pleural effusion or pneumothorax. The visualized skeletal structures are unremarkable. IMPRESSION: No active cardiopulmonary disease. Electronically Signed   By: Guadlupe Spanish M.D.   On: 08/31/2021 12:53   CT Head Wo Contrast  Result Date: 08/31/2021 CLINICAL DATA:  Dizziness EXAM: CT HEAD WITHOUT CONTRAST TECHNIQUE:  Contiguous axial images were obtained from the base of the skull through the vertex without intravenous contrast. COMPARISON:  None. FINDINGS: Brain: No acute intracranial hemorrhage, mass effect, or herniation. No extra-axial fluid collections. No evidence of acute territorial infarct. No hydrocephalus. Patchy hypodensities in the periventricular and subcortical white matter, likely secondary to chronic microvascular ischemic changes. Vascular: No hyperdense vessel or unexpected calcification. Skull: Normal. Negative for fracture or focal lesion. Sinuses/Orbits: No acute finding. Other: None. IMPRESSION: Chronic changes with no acute intracranial process identified. Electronically Signed   By: Jannifer Hick M.D.   On: 08/31/2021 13:00    ____________________________________________   PROCEDURES  Procedure(s) performed (including Critical Care):  Procedures  ____________________________________________   INITIAL IMPRESSION / ASSESSMENT AND PLAN     80 year old female presents to the ER for treatment and evaluation of dizziness. See HPI for details. Plan will be to get screening labs and head CT. Patient agreeable to the plan.   DIFFERENTIAL DIAGNOSIS  Hypovolemia, CVA, Cardiac process,   ED COURSE  Head CT and labs are overall reassuring.  Urine does have some leukocytes and rare bacteria, however the patient denies dysuria or back pain.  Culture added.  Patient states that she got really dizzy moving from the stretcher to the CT scanner.  She did not however get dizzy when orthostatic vitals were obtained.  Discussed possibility of getting an MRI of the brain and patient would like to proceed with the testing.  Patient requesting to leave even though MR has not been done. She was advised that MR is necessary to rule out CVA and other concerns that would explain the dizziness. She wishes to follow up with primary care and agrees to call tomorrow for an appointment. She was given strict  ER return precautions. She currently denies feeling dizzy.     As part of my medical decision making, I reviewed the following data within the electronic MEDICAL RECORD NUMBER Labs reviewed, EKG interpreted, Old EKG reviewed, Notes from prior ED visits, and [place] Controlled Substance Database  ___________________________________________   FINAL CLINICAL IMPRESSION(S) / ED DIAGNOSES  Final diagnoses:  Dizziness     ED Discharge Orders     None        Vanessa Hogan was evaluated in Emergency Department on 08/31/2021 for the symptoms described in the history of present illness. She was evaluated in the context of the global COVID-19  pandemic, which necessitated consideration that the patient might be at risk for infection with the SARS-CoV-2 virus that causes COVID-19. Institutional protocols and algorithms that pertain to the evaluation of patients at risk for COVID-19 are in a state of rapid change based on information released by regulatory bodies including the CDC and federal and state organizations. These policies and algorithms were followed during the patient's care in the ED.   Note:  This document was prepared using Dragon voice recognition software and may include unintentional dictation errors.    Chinita Pester, FNP 08/31/21 1738    Phineas Semen, MD 09/01/21 [phone]

## 2021-08-31 NOTE — ED Notes (Addendum)
Pt presented for weakness and has had dizziness. Concern today for dizziness so severe "she might pass out." No falls or passing out. Pt having "different sensations in head" than normal but no pain reported. Pt has pain in chest intermittently when sits up in mid sternal region but resolves when standing and does not get exacerbated with walking/exercise.   Pt has seen cardiologist and was diagnosed with "muscular skeletal pain."  No SOB or trouble breathing. Hx: HTN, DM and GERD.

## 2021-08-31 NOTE — ED Notes (Signed)
MR screener on phone to speak with pt.

## 2021-08-31 NOTE — Discharge Instructions (Signed)
Please follow up with primary care.  For symptoms of concern, if unable to schedule an appointment, return to the ER.

## 2021-09-01 LAB — URINE CULTURE

## 2021-09-02 ENCOUNTER — Telehealth: Payer: Self-pay | Admitting: Internal Medicine

## 2021-09-02 NOTE — Telephone Encounter (Signed)
Copied from CRM [id number]. Topic: General - Other >> Sep 02, 2021  2:38 PM Pawlus, Maxine Glenn A wrote: Reason for CRM: Pt was in the ED on 11/9 and wanted to follow up with Dr Charlotta Newton regarding possibly needing an internal specialist doctor and if Dr Charlotta Newton had any recommendations. Please advise.

## 2021-09-02 NOTE — Telephone Encounter (Signed)
Appointment scheduled.

## 2021-09-07 ENCOUNTER — Other Ambulatory Visit: Payer: Self-pay

## 2021-09-07 ENCOUNTER — Encounter: Payer: Self-pay | Admitting: Internal Medicine

## 2021-09-07 ENCOUNTER — Ambulatory Visit (INDEPENDENT_AMBULATORY_CARE_PROVIDER_SITE_OTHER): Payer: Medicare Other | Admitting: Internal Medicine

## 2021-09-07 VITALS — BP 134/84 | HR 76 | Temp 98.6°F | Ht 65.51 in | Wt 207.0 lb

## 2021-09-07 DIAGNOSIS — K219 Gastro-esophageal reflux disease without esophagitis: Secondary | ICD-10-CM | POA: Diagnosis not present

## 2021-09-07 DIAGNOSIS — K921 Melena: Secondary | ICD-10-CM | POA: Diagnosis not present

## 2021-09-07 DIAGNOSIS — R42 Dizziness and giddiness: Secondary | ICD-10-CM | POA: Insufficient documentation

## 2021-09-07 MED ORDER — LORAZEPAM 0.5 MG PO TABS
0.5000 mg | ORAL_TABLET | ORAL | 0 refills | Status: DC | PRN
Start: 2021-09-07 — End: 2022-09-28

## 2021-09-07 MED ORDER — DEXLANSOPRAZOLE 60 MG PO CPDR: 60.0000 mg | DELAYED_RELEASE_CAPSULE | Freq: Every day | ORAL | 3 refills | Status: DC

## 2021-09-07 NOTE — Progress Notes (Signed)
Ht 5' 5.51" (1.664 m)   Wt 207 lb (93.9 kg)   BMI 33.91 kg/m    Subjective:    Patient ID: Vanessa Hogan, female    DOB: 1941/10/22, 80 y.o.   MRN: [id number]  Chief Complaint  Patient presents with   Hospitalization Follow-up    For chest pain 10/22 and a feeling of passing out on 11/9     HPI: Vanessa Hogan is a 80 y.o. female  Pt was in the hospital twice had pressure in chest and was diagnosed with GERD - with acid reflux lasted longer than it usually dose  Went to the cardiologist and they   GERD - has had melena x 1 week ago didn't ask at ER, pt didn't tell them either.   Gastroesophageal Reflux She complains of heartburn. She reports no abdominal pain, no belching, no chest pain, no choking, no coughing, no dysphagia, no early satiety, no globus sensation, no hoarse voice, no nausea, no sore throat, no stridor, no tooth decay, no water brash or no wheezing.   Chief Complaint  Patient presents with   Hospitalization Follow-up    For chest pain 10/22 and a feeling of passing out on 11/9     Relevant past medical, surgical, family and social history reviewed and updated as indicated. Interim medical history since our last visit reviewed. Allergies and medications reviewed and updated.  Review of Systems  HENT:  Negative for hoarse voice and sore throat.   Respiratory:  Negative for cough, choking and wheezing.   Cardiovascular:  Negative for chest pain.  Gastrointestinal:  Positive for heartburn. Negative for abdominal pain, dysphagia and nausea.   Per HPI unless specifically indicated above     Objective:    Ht 5' 5.51" (1.664 m)   Wt 207 lb (93.9 kg)   BMI 33.91 kg/m   Wt Readings from Last 3 Encounters:  09/07/21 207 lb (93.9 kg)  08/31/21 207 lb (93.9 kg)  08/25/21 207 lb 6 oz (94.1 kg)    Physical Exam Vitals and nursing note reviewed.  Constitutional:      General: She is not in acute distress.    Appearance: Normal  appearance. She is not ill-appearing or diaphoretic.  Eyes:     Conjunctiva/sclera: Conjunctivae normal.  Cardiovascular:     Rate and Rhythm: Normal rate and regular rhythm.  Pulmonary:     Effort: No respiratory distress.     Breath sounds: No stridor. No wheezing, rhonchi or rales.  Abdominal:     General: Abdomen is flat. Bowel sounds are normal. There is no distension.     Palpations: Abdomen is soft. There is no mass.     Tenderness: There is no abdominal tenderness. There is no guarding.  Skin:    General: Skin is warm and dry.     Coloration: Skin is not jaundiced.     Findings: No erythema.  Neurological:     Mental Status: She is alert.     Cranial Nerves: No cranial nerve deficit.     Sensory: No sensory deficit.     Motor: No weakness.     Coordination: Coordination normal.  Psychiatric:        Mood and Affect: Mood normal.        Behavior: Behavior normal.        Thought Content: Thought content normal.        Judgment: Judgment normal.    Results for orders placed or performed during  the hospital encounter of 08/31/21  Urine Culture   Specimen: Urine, Clean Catch  Result Value Ref Range   Specimen Description      URINE, CLEAN CATCH Performed at [facility], [address] [place], [place] [address]    Special Requests      NONE Performed at [facility], [address] [place], [place] [address]    Culture MULTIPLE SPECIES PRESENT, SUGGEST RECOLLECTION (A)    Report Status 09/01/2021 FINAL   CBC  Result Value Ref Range   WBC 4.4 4.0 - 10.5 K/uL   RBC 6.05 (H) 3.87 - 5.11 MIL/uL   Hemoglobin 14.5 12.0 - 15.0 g/dL   HCT 42.7 (H) 06.2 - 37.6 %   MCV 76.2 (L) 80.0 - 100.0 fL   MCH 24.0 (L) 26.0 - 34.0 pg   MCHC 31.5 30.0 - 36.0 g/dL   RDW 28.3 (H) 15.1 - 76.1 %   Platelets 204 150 - 400 K/uL   nRBC 0.0 0.0 - 0.2 %  Basic metabolic panel  Result Value Ref Range   Sodium 139 135 - 145 mmol/L   Potassium 3.4 (L) 3.5 - 5.1 mmol/L    Chloride 102 98 - 111 mmol/L   CO2 30 22 - 32 mmol/L   Glucose, Bld 114 (H) 70 - 99 mg/dL   BUN 11 8 - 23 mg/dL   Creatinine, Ser 6.07 (H) 0.44 - 1.00 mg/dL   Calcium 9.1 8.9 - 37.1 mg/dL   GFR, Estimated 56 (L) >60 mL/min   Anion gap 7 5 - 15  Urinalysis, Routine w reflex microscopic Urine, Clean Catch  Result Value Ref Range   Color, Urine STRAW (A) YELLOW   APPearance CLEAR (A) CLEAR   Specific Gravity, Urine 1.004 (L) 1.005 - 1.030   pH 6.0 5.0 - 8.0   Glucose, UA NEGATIVE NEGATIVE mg/dL   Hgb urine dipstick NEGATIVE NEGATIVE   Bilirubin Urine NEGATIVE NEGATIVE   Ketones, ur NEGATIVE NEGATIVE mg/dL   Protein, ur NEGATIVE NEGATIVE mg/dL   Nitrite NEGATIVE NEGATIVE   Leukocytes,Ua MODERATE (A) NEGATIVE   RBC / HPF 0-5 0 - 5 RBC/hpf   WBC, UA 6-10 0 - 5 WBC/hpf   Bacteria, UA RARE (A) NONE SEEN   Squamous Epithelial / LPF 0-5 0 - 5  Troponin I (High Sensitivity)  Result Value Ref Range   Troponin I (High Sensitivity) 6 <18 ng/L  Troponin I (High Sensitivity)  Result Value Ref Range   Troponin I (High Sensitivity) 9 <18 ng/L        Current Outpatient Medications:    citalopram (CELEXA) 10 MG tablet, TAKE 1 TABLET BY MOUTH EVERY DAY, Disp: 90 tablet, Rfl: 2   cloNIDine (CATAPRES) 0.2 MG tablet, Take 0.2 mg by mouth 2 (two) times daily., Disp: , Rfl:    colchicine 0.6 MG tablet, Take 1 tablet (0.6 mg total) by mouth daily., Disp: 30 tablet, Rfl: 1   GLOBAL EASE INJECT PEN NEEDLES 32G X 4 MM MISC, SMARTSIG:1 needle SUB-Q 3 Times Daily, Disp: , Rfl:    HUMALOG MIX 75/25 KWIKPEN (75-25) 100 UNIT/ML KwikPen, Inject into the skin., Disp: , Rfl:    loratadine (CLARITIN) 10 MG tablet, Take 10 mg by mouth daily., Disp: , Rfl:    LORazepam (ATIVAN) 0.5 MG tablet, Take 0.5 mg by mouth as needed for anxiety., Disp: , Rfl:    omeprazole (PRILOSEC) 20 MG capsule, Take 20 mg by mouth daily., Disp: , Rfl:  OZEMPIC, 0.25 OR 0.5 MG/DOSE, 2 MG/1.5ML SOPN, Inject 0.5 mg into the skin once  a week., Disp: , Rfl:    insulin degludec (TRESIBA) 100 UNIT/ML FlexTouch Pen, Inject 20 Units into the skin at bedtime. (Patient not taking: Reported on 09/07/2021), Disp: 100 mL, Rfl: 7   terbinafine (LAMISIL) 250 MG tablet, Take 1 tablet (250 mg total) by mouth daily. (Patient not taking: Reported on 09/07/2021), Disp: 30 tablet, Rfl: 3   valsartan-hydrochlorothiazide (DIOVAN-HCT) 160-12.5 MG tablet, Take 1 tablet by mouth daily., Disp: , Rfl:     Assessment & Plan:  GERD Will refer to Gi  Stop prilosec adding dexilant  patient advised to avoid laying down soon after his meals. He took a 2 hours between dinner and bedtime. Avoid spicy food and triggers that he knows food wise that worsen his acid reflux. Patient verbalized understanding of the above. Lifestyle modifications as above discussed with patient.   2, MELENA : stool cards for FOBT CBC last checked 11/9 .Avoid all NSAIDS , no ALEVE< ADVIL Ibuburfen.    3. Dizziness and chronic  changes on CT  Check US Carotids. Needs an ECHO - to call cards office and needs to be seen since dizzy. Will refer to neurology. to increase water intake. take bp logs. Pt advised to keep B/P logs. Pt verbalized understanding of the same. Pt to have a low salt diet. Exercise to reach a goal of at least 150 mins a week. Lifestyle modifications explained and pt understands importance of the above.  Use meclizine 12.5 mg q 6-8 hrly prn for ? vertigo  consider holter monitor if develops palpitations.      Problem List Items Addressed This Visit   None    Orders Placed This Encounter  Procedures   US Carotid Duplex Bilateral   CBC With Differential/Platelet   Ambulatory referral to Gastroenterology   Ambulatory referral to Neurology     Meds ordered this encounter  Medications   dexlansoprazole (DEXILANT) 60 MG capsule    Sig: Take 1 capsule (60 mg total) by mouth daily.    Dispense:  30 capsule    Refill:  3     Follow up plan: No  follow-ups on file.

## 2021-09-12 ENCOUNTER — Other Ambulatory Visit: Payer: Self-pay | Admitting: Internal Medicine

## 2021-09-12 DIAGNOSIS — E119 Type 2 diabetes mellitus without complications: Secondary | ICD-10-CM

## 2021-09-12 DIAGNOSIS — K219 Gastro-esophageal reflux disease without esophagitis: Secondary | ICD-10-CM

## 2021-09-12 NOTE — Telephone Encounter (Signed)
Copied from CRM [id number]. Topic: Quick Communication - Rx Refill/Question >> Sep 12, 2021  8:52 AM Aretta Nip wrote:   Note PT is completely out current reading 116 BS  Medication Refill - Medication: HUMALOG MIX 75/25 KWIKPEN (75-25) 100 UNIT/ML KwikPen   06/25/2021   Sig - Route: Inject into the skin. - Subcutaneous     Has the patient contacted their pharmacy? Yes.   (Agent: If no, request that the patient contact the pharmacy for the refill. If patient does not wish to contact the pharmacy document the reason why and proceed with request.) (Agent: If yes, when and what did the pharmacy advise?) call dr  Preferred Pharmacy (with phone number or street name): [id number] - [place], [place] - [address] [place] [address] Phone: [phone] Fax: [phone]  Has the patient been seen for an appointment in the last year OR does the patient have an upcoming appointment? Yes.    Agent: Please be advised that RX refills may take up to 3 business days. We ask that you follow-up with your pharmacy.

## 2021-09-12 NOTE — Telephone Encounter (Signed)
Requested medication (s) are due for refill today:   Yes  Requested medication (s) are on the active medication list:   Yes  Future visit scheduled:   Yes with Dr. Charlotta Newton on 10/20/2021   Last ordered: 06/25/2021    Returned because it's indicating a historical provider wrote for this however it was Dr. Charlotta Newton.   Prescribing information needed.  Requested Prescriptions  Pending Prescriptions Disp Refills   HUMALOG MIX 75/25 KWIKPEN (75-25) 100 UNIT/ML KwikPen 15 mL     Sig: Inject into the skin.     Endocrinology:  Diabetes - Insulins Passed - 09/12/2021  9:39 AM      Passed - HBA1C is between 0 and 7.9 and within 180 days    HB A1C (BAYER DCA - WAIVED)  Date Value Ref Range Status  07/04/2021 6.7 (H) 4.8 - 5.6 % Final    Comment:             Prediabetes: 5.7 - 6.4          Diabetes: >6.4          Glycemic control for adults with diabetes: <7.0               **Please note reference interval change**           Passed - Valid encounter within last 6 months    Recent Outpatient Visits           5 days ago Gastroesophageal reflux disease without esophagitis   [facility] Vigg, Avanti, MD   1 month ago Screening for osteoporosis   [facility] Vigg, Avanti, MD   1 month ago Foot pain, left   [facility] Vigg, Avanti, MD   2 months ago Diabetes mellitus without complication (HCC)   [facility] Vigg, Avanti, MD       Future Appointments             In 1 month Vigg, Avanti, MD [facility]   In 1 month Herbie Baltimore, Piedad Climes, MD [facility]   In 1 month Vigg, Avanti, MD [facility]

## 2021-09-13 ENCOUNTER — Other Ambulatory Visit: Payer: Self-pay | Admitting: Internal Medicine

## 2021-09-13 ENCOUNTER — Telehealth: Payer: Self-pay | Admitting: Internal Medicine

## 2021-09-13 ENCOUNTER — Encounter: Payer: Self-pay | Admitting: Internal Medicine

## 2021-09-13 ENCOUNTER — Ambulatory Visit (INDEPENDENT_AMBULATORY_CARE_PROVIDER_SITE_OTHER): Payer: Medicare Other | Admitting: Internal Medicine

## 2021-09-13 DIAGNOSIS — E1165 Type 2 diabetes mellitus with hyperglycemia: Secondary | ICD-10-CM | POA: Insufficient documentation

## 2021-09-13 DIAGNOSIS — E119 Type 2 diabetes mellitus without complications: Secondary | ICD-10-CM

## 2021-09-13 DIAGNOSIS — K219 Gastro-esophageal reflux disease without esophagitis: Secondary | ICD-10-CM | POA: Diagnosis not present

## 2021-09-13 DIAGNOSIS — Z794 Long term (current) use of insulin: Secondary | ICD-10-CM | POA: Diagnosis not present

## 2021-09-13 MED ORDER — OMEPRAZOLE 40 MG PO CPDR: 40.0000 mg | DELAYED_RELEASE_CAPSULE | Freq: Every day | ORAL | 3 refills | Status: DC

## 2021-09-13 NOTE — Addendum Note (Signed)
Addended by: Leward Quan A on: 09/13/2021 02:46 PM   Modules accepted: Orders

## 2021-09-13 NOTE — Telephone Encounter (Signed)
Copied from CRM [id number]. Topic: Quick Communication - Rx Refill/Question >> Sep 12, 2021  9:06 AM Aretta Nip wrote: Medication: dexlansoprazole (DEXILANT) 60 MG capsule 30 capsule 3 09/07/2021   Sig - Route: Take 1 capsule (60 mg total) by mouth daily. - Oral  Sent to pharmacy as: dexlansoprazole (DEXILANT) 60 MG capsule  E-Prescribing Status: Receipt confirmed by pharmacy (09/07/2021 3:22 PM EST   Pt states that this med is over $200.00, can not afford and wanting to know if something else available ?  Has the patient contacted their pharmacy? Yes.   (Agent: If no, request that the patient contact the pharmacy for the refill. If patient does not wish to contact the pharmacy document the reason why and proceed with request.) (Agent: If yes, when and what did the pharmacy advise?)  Preferred Pharmacy (with phone number or street name): [id number] [place], [place] - [address] [place] [address] Phone: [phone] Fax: [phone]   Has the patient been seen for an appointment in the last year OR does the patient have an upcoming appointment? Yes.    Agent: Please be advised that RX refills may take up to 3 business days. We ask that you follow-up with your pharmacy.

## 2021-09-13 NOTE — Progress Notes (Addendum)
There were no vitals taken for this visit.   Subjective:    Patient ID: Vanessa Hogan, female    DOB: May 15, 1941, 80 y.o.   MRN: [id number]  No chief complaint on file.   HPI: Vanessa Hogan is a 80 y.o. female   This visit was completed via telephone due to the restrictions of the COVID-19 pandemic. All issues as above were discussed and addressed but no physical exam was performed. If it was felt that the patient should be evaluated in the office, they were directed there. The patient verbally consented to this visit. Patient was unable to complete an audio/visual visit due to Technical difficulties. Due to the catastrophic nature of the COVID-19 pandemic, this visit was done through audio contact only. Location of the patient: home Location of the provider: work Those involved with this call:  Provider: Loura Pardon, MD CMA: Tristan Schroeder, CMA Front Desk/Registration: Channing Mutters  Time spent on call: 10 minutes on the phone discussing health concerns. 10 minutes total spent in review of patient's record and preparation of their chart.    Diabetes She presents for her follow-up (is on ozempic and wants to continue humalog 75/25 untis 18 units q am and 20 units q pm) diabetic visit. She has type 2 diabetes mellitus. Her disease course has been fluctuating. Pertinent negatives for diabetes include no blurred vision, no chest pain, no foot paresthesias, no foot ulcerations, no polydipsia, no polyphagia, no polyuria, no visual change, no weakness and no weight loss.   No chief complaint on file.   Relevant past medical, surgical, family and social history reviewed and updated as indicated. Interim medical history since our last visit reviewed. Allergies and medications reviewed and updated.  Review of Systems  Constitutional:  Negative for weight loss.  Eyes:  Negative for blurred vision.  Cardiovascular:  Negative for chest pain.  Endocrine: Negative for  polydipsia, polyphagia and polyuria.  Neurological:  Negative for weakness.   Per HPI unless specifically indicated above     Objective:    There were no vitals taken for this visit.  Wt Readings from Last 3 Encounters:  09/07/21 207 lb (93.9 kg)  08/31/21 207 lb (93.9 kg)  08/25/21 207 lb 6 oz (94.1 kg)    Physical Exam  Unable to peform sec to virtual visit.   Results for orders placed or performed during the hospital encounter of 08/31/21  Urine Culture   Specimen: Urine, Clean Catch  Result Value Ref Range   Specimen Description      URINE, CLEAN CATCH Performed at [facility], [address] [place], [place] [address]    Special Requests      NONE Performed at [facility], [address] [place], [place] [address]    Culture MULTIPLE SPECIES PRESENT, SUGGEST RECOLLECTION (A)    Report Status 09/01/2021 FINAL   CBC  Result Value Ref Range   WBC 4.4 4.0 - 10.5 K/uL   RBC 6.05 (H) 3.87 - 5.11 MIL/uL   Hemoglobin 14.5 12.0 - 15.0 g/dL   HCT 35.3 (H) 61.4 - 43.1 %   MCV 76.2 (L) 80.0 - 100.0 fL   MCH 24.0 (L) 26.0 - 34.0 pg   MCHC 31.5 30.0 - 36.0 g/dL   RDW 54.0 (H) 08.6 - 76.1 %   Platelets 204 150 - 400 K/uL   nRBC 0.0 0.0 - 0.2 %  Basic metabolic panel  Result Value Ref Range   Sodium 139 135 -  145 mmol/L   Potassium 3.4 (L) 3.5 - 5.1 mmol/L   Chloride 102 98 - 111 mmol/L   CO2 30 22 - 32 mmol/L   Glucose, Bld 114 (H) 70 - 99 mg/dL   BUN 11 8 - 23 mg/dL   Creatinine, Ser 3.32 (H) 0.44 - 1.00 mg/dL   Calcium 9.1 8.9 - 95.1 mg/dL   GFR, Estimated 56 (L) >60 mL/min   Anion gap 7 5 - 15  Urinalysis, Routine w reflex microscopic Urine, Clean Catch  Result Value Ref Range   Color, Urine STRAW (A) YELLOW   APPearance CLEAR (A) CLEAR   Specific Gravity, Urine 1.004 (L) 1.005 - 1.030   pH 6.0 5.0 - 8.0   Glucose, UA NEGATIVE NEGATIVE mg/dL   Hgb urine dipstick NEGATIVE NEGATIVE   Bilirubin Urine NEGATIVE NEGATIVE   Ketones, ur  NEGATIVE NEGATIVE mg/dL   Protein, ur NEGATIVE NEGATIVE mg/dL   Nitrite NEGATIVE NEGATIVE   Leukocytes,Ua MODERATE (A) NEGATIVE   RBC / HPF 0-5 0 - 5 RBC/hpf   WBC, UA 6-10 0 - 5 WBC/hpf   Bacteria, UA RARE (A) NONE SEEN   Squamous Epithelial / LPF 0-5 0 - 5  Troponin I (High Sensitivity)  Result Value Ref Range   Troponin I (High Sensitivity) 6 <18 ng/L  Troponin I (High Sensitivity)  Result Value Ref Range   Troponin I (High Sensitivity) 9 <18 ng/L        Current Outpatient Medications:    omeprazole (PRILOSEC) 40 MG capsule, Take 1 capsule (40 mg total) by mouth daily., Disp: 30 capsule, Rfl: 3   citalopram (CELEXA) 10 MG tablet, TAKE 1 TABLET BY MOUTH EVERY DAY, Disp: 90 tablet, Rfl: 2   cloNIDine (CATAPRES) 0.2 MG tablet, Take 0.2 mg by mouth 2 (two) times daily., Disp: , Rfl:    colchicine 0.6 MG tablet, Take 1 tablet (0.6 mg total) by mouth daily., Disp: 30 tablet, Rfl: 1   GLOBAL EASE INJECT PEN NEEDLES 32G X 4 MM MISC, SMARTSIG:1 needle SUB-Q 3 Times Daily, Disp: , Rfl:    HUMALOG MIX 75/25 KWIKPEN (75-25) 100 UNIT/ML KwikPen, Inject into the skin., Disp: , Rfl:    loratadine (CLARITIN) 10 MG tablet, Take 10 mg by mouth daily., Disp: , Rfl:    LORazepam (ATIVAN) 0.5 MG tablet, Take 1 tablet (0.5 mg total) by mouth as needed for anxiety., Disp: 20 tablet, Rfl: 0   OZEMPIC, 0.25 OR 0.5 MG/DOSE, 2 MG/1.5ML SOPN, Inject 0.5 mg into the skin once a week., Disp: , Rfl:    valsartan-hydrochlorothiazide (DIOVAN-HCT) 160-12.5 MG tablet, Take 1 tablet by mouth daily., Disp: , Rfl:     Assessment & Plan:  DM  Continue humalog 75/25 18 untis q am and 20 q pm Continue ozempic as rx.  To fu with me with repeat labs x dec   pt acknowledges the plan and voices understanding of the same.    2 GERD worsening, was hospitalzied for such taking prilosec 20 mg and tums in the afternoon  Will increase dose of prilosec to 40 mg Pt coulndt take dexilant as it costed her 200 $. patient  advised to avoid laying down soon after his meals. He took a 2 hours between dinner and bedtime. Avoid spicy food and triggers that he knows food wise that worsen his acid reflux. Patient verbalized understanding of the above. Lifestyle modifications as above discussed with patient.  Problem List Items Addressed This Visit   None  No orders of the defined types were placed in this encounter.    Meds ordered this encounter  Medications   omeprazole (PRILOSEC) 40 MG capsule    Sig: Take 1 capsule (40 mg total) by mouth daily.    Dispense:  30 capsule    Refill:  3     Follow up plan: No follow-ups on file.

## 2021-09-14 NOTE — Telephone Encounter (Signed)
Routing to provider to advise.  

## 2021-09-28 LAB — FECAL OCCULT BLOOD, IMMUNOCHEMICAL: Fecal Occult Bld: NEGATIVE

## 2021-09-29 ENCOUNTER — Ambulatory Visit (INDEPENDENT_AMBULATORY_CARE_PROVIDER_SITE_OTHER): Payer: Medicare Other | Admitting: *Deleted

## 2021-09-29 ENCOUNTER — Ambulatory Visit: Payer: Self-pay

## 2021-09-29 DIAGNOSIS — Z1231 Encounter for screening mammogram for malignant neoplasm of breast: Secondary | ICD-10-CM

## 2021-09-29 DIAGNOSIS — Z780 Asymptomatic menopausal state: Secondary | ICD-10-CM

## 2021-09-29 DIAGNOSIS — Z0000 Encounter for general adult medical examination without abnormal findings: Secondary | ICD-10-CM

## 2021-09-29 NOTE — Telephone Encounter (Signed)
Pt. Reports yesterday started having left lower abdominal pain that comes and goes. Sharp pain. Has had some constipation, but has had a good "bowel movement." No fever , diarrhea or vomiting. Appointment made for tomorrow. Instructed to go to ED for worsening of symptoms, verbalizes understanding.    Reason for Disposition  [1] MODERATE pain (e.g., interferes with normal activities) AND [2] pain comes and goes (cramps) AND [3] present > 24 hours  (Exception: pain with Vomiting or Diarrhea - see that Guideline)  Answer Assessment - Initial Assessment Questions 1. LOCATION: "Where does it hurt?"      Left lower 2. RADIATION: "Does the pain shoot anywhere else?" (e.g., chest, back)     No 3. ONSET: "When did the pain begin?" (e.g., minutes, hours or days ago)      Yesterday 4. SUDDEN: "Gradual or sudden onset?"     Sudden 5. PATTERN "Does the pain come and go, or is it constant?"    - If constant: "Is it getting better, staying the same, or worsening?"      (Note: Constant means the pain never goes away completely; most serious pain is constant and it progresses)     - If intermittent: "How long does it last?" "Do you have pain now?"     (Note: Intermittent means the pain goes away completely between bouts)     Comes and goes 6. SEVERITY: "How bad is the pain?"  (e.g., Scale 1-10; mild, moderate, or severe)   - MILD (1-3): doesn't interfere with normal activities, abdomen soft and not tender to touch    - MODERATE (4-7): interferes with normal activities or awakens from sleep, abdomen tender to touch    - SEVERE (8-10): excruciating pain, doubled over, unable to do any normal activities      Sharp pain - 8 7. RECURRENT SYMPTOM: "Have you ever had this type of stomach pain before?" If Yes, ask: "When was the last time?" and "What happened that time?"      No 8. CAUSE: "What do you think is causing the stomach pain?"     Acid reflux 9. RELIEVING/AGGRAVATING FACTORS: "What makes it better  or worse?" (e.g., movement, antacids, bowel movement)     No 10. OTHER SYMPTOMS: "Do you have any other symptoms?" (e.g., back pain, diarrhea, fever, urination pain, vomiting)       Constipated 11. PREGNANCY: "Is there any chance you are pregnant?" "When was your last menstrual period?"       No  Protocols used: Abdominal Pain - [facility]

## 2021-09-29 NOTE — Progress Notes (Signed)
Subjective:   Vanessa Hogan is a 80 y.o. female who presents for Medicare Annual (Subsequent) preventive examination.  I connected with  [name] on 09/29/21 by a telephone enabled telemedicine application and verified that I am speaking with the correct person using two identifiers.   I discussed the limitations of evaluation and management by telemedicine. The patient expressed understanding and agreed to proceed.  Patient location: home  Provider location:  Tele-Health not in office    Review of Systems     Cardiac Risk Factors include: advanced age (>57men, >51 women);diabetes mellitus;hypertension     Objective:    Today's Vitals   09/29/21 0949  PainSc: 8    There is no height or weight on file to calculate BMI.  Advanced Directives 09/29/2021 08/13/2021  Does Patient Have a Medical Advance Directive? No No  Would patient like information on creating a medical advance directive? No - Patient declined No - Patient declined    Current Medications (verified) Outpatient Encounter Medications as of 09/29/2021  Medication Sig   citalopram (CELEXA) 10 MG tablet TAKE 1 TABLET BY MOUTH EVERY DAY   cloNIDine (CATAPRES) 0.2 MG tablet Take 0.2 mg by mouth 2 (two) times daily.   colchicine 0.6 MG tablet Take 1 tablet (0.6 mg total) by mouth daily.   GLOBAL EASE INJECT PEN NEEDLES 32G X 4 MM MISC SMARTSIG:1 needle SUB-Q 3 Times Daily   HUMALOG MIX 75/25 KWIKPEN (75-25) 100 UNIT/ML KwikPen Inject into the skin.   loratadine (CLARITIN) 10 MG tablet Take 10 mg by mouth daily.   LORazepam (ATIVAN) 0.5 MG tablet Take 1 tablet (0.5 mg total) by mouth as needed for anxiety.   omeprazole (PRILOSEC) 40 MG capsule Take 1 capsule (40 mg total) by mouth daily.   OZEMPIC, 0.25 OR 0.5 MG/DOSE, 2 MG/1.5ML SOPN Inject 0.5 mg into the skin once a week.   valsartan-hydrochlorothiazide (DIOVAN-HCT) 160-12.5 MG tablet Take 1 tablet by mouth daily.   No  facility-administered encounter medications on file as of 09/29/2021.    Allergies (verified) Asa [aspirin], Morphine and related, Nitroglycerin, Penicillins, and Sertraline   History: Past Medical History:  Diagnosis Date   Anxiety    Depression    Diabetes mellitus without complication (HCC)    GERD (gastroesophageal reflux disease)    Gout    Hypertension    Past Surgical History:  Procedure Laterality Date   ABDOMINAL HYSTERECTOMY     BREAST SURGERY     HERNIA REPAIR     TUBAL LIGATION     Family History  Problem Relation Age of Onset   Stroke Mother    Stroke Father    Dementia Sister    Hypertension Brother    Diabetes Daughter    Hypertension Son    Social History   Socioeconomic History   Marital status: Married    Spouse name: Not on file   Number of children: Not on file   Years of education: Not on file   Highest education level: Not on file  Occupational History   Not on file  Tobacco Use   Smoking status: Former    Packs/day: 0.25    Years: 5.00    Pack years: 1.25    Types: Cigarettes   Smokeless tobacco: Former  Building services engineer Use: Never used  Substance and Sexual Activity   Alcohol use: Yes    Alcohol/week: 1.0 standard drink    Types: 1 Glasses of wine per week  Drug use: Never   Sexual activity: Not Currently  Other Topics Concern   Not on file  Social History Narrative   Not on file   Social Determinants of Health   Financial Resource Strain: Low Risk    Difficulty of Paying Living Expenses: Not hard at all  Food Insecurity: No Food Insecurity   Worried About Programme researcher, broadcasting/film/video in the Last Year: Never true   Ran Out of Food in the Last Year: Never true  Transportation Needs: Not on file  Physical Activity: Not on file  Stress: No Stress Concern Present   Feeling of Stress : Not at all  Social Connections: Moderately Isolated   Frequency of Communication with Friends and Family: More than three times a week   Frequency  of Social Gatherings with Friends and Family: Three times a week   Attends Religious Services: More than 4 times per year   Active Member of Clubs or Organizations: No   Attends Banker Meetings: Never   Marital Status: Separated    Tobacco Counseling Counseling given: Not Answered   Clinical Intake:  Pre-visit preparation completed: Yes  Pain : 0-10 Pain Score: 8  Pain Type: Acute pain Pain Location: Other (Comment) (lower abdomen) Pain Orientation: Left Pain Descriptors / Indicators: Sharp Pain Onset: Yesterday Pain Frequency: Intermittent     Nutritional Risks: None Diabetes: Yes CBG done?: No Did pt. bring in CBG monitor from home?: No  How often do you need to have someone help you when you read instructions, pamphlets, or other written materials from your doctor or pharmacy?: 1 - Never  Diabetic?  Yes  Nutrition Risk Assessment:  Has the patient had any N/V/D within the last 2 months?  No  Does the patient have any non-healing wounds?  No  Has the patient had any unintentional weight loss or weight gain?  No   Diabetes:  Is the patient diabetic?  Yes  If diabetic, was a CBG obtained today?  No  Did the patient bring in their glucometer from home?  No  How often do you monitor your CBG's? 2 times daily  Financial Strains and Diabetes Management:  Are you having any financial strains with the device, your supplies or your medication? No .  Does the patient want to be seen by Chronic Care Management for management of their diabetes?  No  Would the patient like to be referred to a Nutritionist or for Diabetic Management?  No   Diabetic Exams:  Diabetic Eye Exam:. Overdue for diabetic eye exam. Pt has been advised about the importance in completing this exam.  Diabetic Foot Exam: . Pt has been advised about the importance in completing this exam.  Interpreter Needed?: No  Information entered by :: Remi Haggard LPN   Activities of Daily  Living In your present state of health, do you have any difficulty performing the following activities: 09/29/2021  Hearing? N  Vision? N  Difficulty concentrating or making decisions? N  Walking or climbing stairs? N  Dressing or bathing? N  Doing errands, shopping? N  Preparing Food and eating ? N  Using the Toilet? N  In the past six months, have you accidently leaked urine? N  Do you have problems with loss of bowel control? N  Managing your Medications? N  Managing your Finances? N  Housekeeping or managing your Housekeeping? N    Patient Care Team: Loura Pardon, MD as PCP - General (Internal Medicine)  Indicate any  recent Medical Services you may have received from other than [facility] providers in the past year (date may be approximate).     Assessment:   This is a routine wellness examination for [facility].  Hearing/Vision screen Hearing Screening - Comments:: No trouble hearing Vision Screening - Comments:: Not up to date  Dietary issues and exercise activities discussed: Current Exercise Habits: Structured exercise class;Home exercise routine, Type of exercise: yoga;walking, Time (Minutes): 20, Frequency (Times/Week): 3, Weekly Exercise (Minutes/Week): 60, Intensity: Mild   Goals Addressed             This Visit's Progress    Patient Stated       Would like to increase physical activity      Depression Screen PHQ 2/9 Scores 09/07/2021 07/21/2021 07/04/2021  PHQ - 2 Score 0 0 1  PHQ- 9 Score 3 0 4    Fall Risk Fall Risk  09/07/2021 07/28/2021 07/21/2021 07/04/2021  Falls in the past year? 0 0 0 0  Number falls in past yr: 0 0 0 0  Injury with Fall? 0 0 0 -  Risk for fall due to : No Fall Risks No Fall Risks No Fall Risks No Fall Risks  Follow up Falls evaluation completed Falls evaluation completed Falls evaluation completed Falls evaluation completed    FALL RISK PREVENTION PERTAINING TO THE HOME:  Any stairs in or around the home? No  If so, are there any  without handrails? No  Home free of loose throw rugs in walkways, pet beds, electrical cords, etc? Yes  Adequate lighting in your home to reduce risk of falls? Yes   ASSISTIVE DEVICES UTILIZED TO PREVENT FALLS:  Life alert? No  Use of a cane, walker or w/c? No  Grab bars in the bathroom? No  Shower chair or bench in shower? No  Elevated toilet seat or a handicapped toilet? Yes   TIMED UP AND GO:  Was the test performed? No .    Cognitive Function:  Normal cognitive status assessed by direct observation by this Nurse Health Advisor. No abnormalities found.          Immunizations Immunization History  Administered Date(s) Administered   [employer] Vaccination 01/29/2020, 02/26/2020   Moderna Sars-Covid-2 Vaccination 01/29/2020, 02/26/2020, 11/18/2020, 06/06/2021    TDAP status: Due, Education has been provided regarding the importance of this vaccine. Advised may receive this vaccine at local pharmacy or Health Dept. Aware to provide a copy of the vaccination record if obtained from local pharmacy or Health Dept. Verbalized acceptance and understanding.  Flu Vaccine status: Declined, Education has been provided regarding the importance of this vaccine but patient still declined. Advised may receive this vaccine at local pharmacy or Health Dept. Aware to provide a copy of the vaccination record if obtained from local pharmacy or Health Dept. Verbalized acceptance and understanding.  Pneumococcal vaccine status: Up to date  Covid-19 vaccine status: Completed vaccines  Qualifies for Shingles Vaccine? Yes   Zostavax completed  no   Shingrix Completed?: No.    Education has been provided regarding the importance of this vaccine. Patient has been advised to call insurance company to determine out of pocket expense if they have not yet received this vaccine. Advised may also receive vaccine at local pharmacy or Health Dept. Verbalized acceptance and  understanding.  Screening Tests Health Maintenance  Topic Date Due   Pneumonia Vaccine 31+ Years old (1 - PCV) Never done   FOOT EXAM  Never done  OPHTHALMOLOGY EXAM  Never done   TETANUS/TDAP  Never done   Zoster Vaccines- Shingrix (1 of 2) Never done   DEXA SCAN  Never done   COVID-19 Vaccine (5 - Booster for Moderna series) 08/01/2021   INFLUENZA VACCINE  01/20/2022 (Originally 05/23/2021)   HEMOGLOBIN A1C  01/01/2022   HPV VACCINES  Aged Out    Health Maintenance  Health Maintenance Due  Topic Date Due   Pneumonia Vaccine 67+ Years old (1 - PCV) Never done   FOOT EXAM  Never done   OPHTHALMOLOGY EXAM  Never done   TETANUS/TDAP  Never done   Zoster Vaccines- Shingrix (1 of 2) Never done   DEXA SCAN  Never done   COVID-19 Vaccine (5 - Booster for Moderna series) 08/01/2021    Colorectal cancer screening: Type of screening: Colonoscopy. Completed 2017 per patient . Repeat every 5 years  Mammogram status: Ordered  . Pt provided with contact info and advised to call to schedule appt.   Bone Density status: Ordered  . Pt provided with contact info and advised to call to schedule appt.  Lung Cancer Screening: (Low Dose CT Chest recommended if Age 43-80 years, 30 pack-year currently smoking OR have quit w/in 15years.) does not qualify.   Lung Cancer Screening Referral:   Additional Screening:  Hepatitis C Screening: does not qualify;   Vision Screening: Recommended annual ophthalmology exams for early detection of glaucoma and other disorders of the eye. Is the patient up to date with their annual eye exam?  No  Who is the provider or what is the name of the office in which the patient attends annual eye exams? [facility] number given If pt is not established with a provider, would they like to be referred to a provider to establish care? No .   Dental Screening: Recommended annual dental exams for proper oral hygiene  Community Resource Referral / Chronic Care  Management: CRR required this visit?  No   CCM required this visit?  No      Plan:     I have personally reviewed and noted the following in the patient's chart:   Medical and social history Use of alcohol, tobacco or illicit drugs  Current medications and supplements including opioid prescriptions.  Functional ability and status Nutritional status Physical activity Advanced directives List of other physicians Hospitalizations, surgeries, and ER visits in previous 12 months Vitals Screenings to include cognitive, depression, and falls Referrals and appointments  In addition, I have reviewed and discussed with patient certain preventive protocols, quality metrics, and best practice recommendations. A written personalized care plan for preventive services as well as general preventive health recommendations were provided to patient.     Remi Haggard, LPN   [id number]/05/[id number]   Nurse Notes:

## 2021-09-29 NOTE — Patient Instructions (Signed)
Vanessa Hogan , Thank you for taking time to come for your Medicare Wellness Visit. I appreciate your ongoing commitment to your health goals. Please review the following plan we discussed and let me know if I can assist you in the future.   Screening recommendations/referrals: Colonoscopy: Education provided Mammogram: Education provided Bone Density: Education provided Recommended yearly ophthalmology/optometry visit for glaucoma screening and checkup Recommended yearly dental visit for hygiene and checkup  Vaccinations: Influenza vaccine: Education provided Pneumococcal vaccine: up to date Tdap vaccine: Education provided Shingles vaccine: Education provided    Advanced directives: Education provided  Conditions/risks identified:      Preventive Care 65 Years and Older, Female Preventive care refers to lifestyle choices and visits with your health care provider that can promote health and wellness. What does preventive care include? A yearly physical exam. This is also called an annual well check. Dental exams once or twice a year. Routine eye exams. Ask your health care provider how often you should have your eyes checked. Personal lifestyle choices, including: Daily care of your teeth and gums. Regular physical activity. Eating a healthy diet. Avoiding tobacco and drug use. Limiting alcohol use. Practicing safe sex. Taking low-dose aspirin every day. Taking vitamin and mineral supplements as recommended by your health care provider. What happens during an annual well check? The services and screenings done by your health care provider during your annual well check will depend on your age, overall health, lifestyle risk factors, and family history of disease. Counseling  Your health care provider may ask you questions about your: Alcohol use. Tobacco use. Drug use. Emotional well-being. Home and relationship well-being. Sexual activity. Eating habits. History of  falls. Memory and ability to understand (cognition). Work and work Astronomer. Reproductive health. Screening  You may have the following tests or measurements: Height, weight, and BMI. Blood pressure. Lipid and cholesterol levels. These may be checked every 5 years, or more frequently if you are over 52 years old. Skin check. Lung cancer screening. You may have this screening every year starting at age 51 if you have a 30-pack-year history of smoking and currently smoke or have quit within the past 15 years. Fecal occult blood test (FOBT) of the stool. You may have this test every year starting at age 36. Flexible sigmoidoscopy or colonoscopy. You may have a sigmoidoscopy every 5 years or a colonoscopy every 10 years starting at age 9. Hepatitis C blood test. Hepatitis B blood test. Sexually transmitted disease (STD) testing. Diabetes screening. This is done by checking your blood sugar (glucose) after you have not eaten for a while (fasting). You may have this done every 1-3 years. Bone density scan. This is done to screen for osteoporosis. You may have this done starting at age 70. Mammogram. This may be done every 1-2 years. Talk to your health care provider about how often you should have regular mammograms. Talk with your health care provider about your test results, treatment options, and if necessary, the need for more tests. Vaccines  Your health care provider may recommend certain vaccines, such as: Influenza vaccine. This is recommended every year. Tetanus, diphtheria, and acellular pertussis (Tdap, Td) vaccine. You may need a Td booster every 10 years. Zoster vaccine. You may need this after age 31. Pneumococcal 13-valent conjugate (PCV13) vaccine. One dose is recommended after age 57. Pneumococcal polysaccharide (PPSV23) vaccine. One dose is recommended after age 53. Talk to your health care provider about which screenings and vaccines you need and how often  you need  them. This information is not intended to replace advice given to you by your health care provider. Make sure you discuss any questions you have with your health care provider. Document Released: 11/05/2015 Document Revised: 06/28/2016 Document Reviewed: 08/10/2015 Elsevier Interactive Patient Education  2017 [place] Prevention in the Home Falls can cause injuries. They can happen to people of all ages. There are many things you can do to make your home safe and to help prevent falls. What can I do on the outside of my home? Regularly fix the edges of walkways and driveways and fix any cracks. Remove anything that might make you trip as you walk through a door, such as a raised step or threshold. Trim any bushes or trees on the path to your home. Use bright outdoor lighting. Clear any walking paths of anything that might make someone trip, such as rocks or tools. Regularly check to see if handrails are loose or broken. Make sure that both sides of any steps have handrails. Any raised decks and porches should have guardrails on the edges. Have any leaves, snow, or ice cleared regularly. Use sand or salt on walking paths during winter. Clean up any spills in your garage right away. This includes oil or grease spills. What can I do in the bathroom? Use night lights. Install grab bars by the toilet and in the tub and shower. Do not use towel bars as grab bars. Use non-skid mats or decals in the tub or shower. If you need to sit down in the shower, use a plastic, non-slip stool. Keep the floor dry. Clean up any water that spills on the floor as soon as it happens. Remove soap buildup in the tub or shower regularly. Attach bath mats securely with double-sided non-slip rug tape. Do not have throw rugs and other things on the floor that can make you trip. What can I do in the bedroom? Use night lights. Make sure that you have a light by your bed that is easy to reach. Do not use  any sheets or blankets that are too big for your bed. They should not hang down onto the floor. Have a firm chair that has side arms. You can use this for support while you get dressed. Do not have throw rugs and other things on the floor that can make you trip. What can I do in the kitchen? Clean up any spills right away. Avoid walking on wet floors. Keep items that you use a lot in easy-to-reach places. If you need to reach something above you, use a strong step stool that has a grab bar. Keep electrical cords out of the way. Do not use floor polish or wax that makes floors slippery. If you must use wax, use non-skid floor wax. Do not have throw rugs and other things on the floor that can make you trip. What can I do with my stairs? Do not leave any items on the stairs. Make sure that there are handrails on both sides of the stairs and use them. Fix handrails that are broken or loose. Make sure that handrails are as long as the stairways. Check any carpeting to make sure that it is firmly attached to the stairs. Fix any carpet that is loose or worn. Avoid having throw rugs at the top or bottom of the stairs. If you do have throw rugs, attach them to the floor with carpet tape. Make sure that you have a light  switch at the top of the stairs and the bottom of the stairs. If you do not have them, ask someone to add them for you. What else can I do to help prevent falls? Wear shoes that: Do not have high heels. Have rubber bottoms. Are comfortable and fit you well. Are closed at the toe. Do not wear sandals. If you use a stepladder: Make sure that it is fully opened. Do not climb a closed stepladder. Make sure that both sides of the stepladder are locked into place. Ask someone to hold it for you, if possible. Clearly mark and make sure that you can see: Any grab bars or handrails. First and last steps. Where the edge of each step is. Use tools that help you move around (mobility aids)  if they are needed. These include: Canes. Walkers. Scooters. Crutches. Turn on the lights when you go into a dark area. Replace any light bulbs as soon as they burn out. Set up your furniture so you have a clear path. Avoid moving your furniture around. If any of your floors are uneven, fix them. If there are any pets around you, be aware of where they are. Review your medicines with your doctor. Some medicines can make you feel dizzy. This can increase your chance of falling. Ask your doctor what other things that you can do to help prevent falls. This information is not intended to replace advice given to you by your health care provider. Make sure you discuss any questions you have with your health care provider. Document Released: 08/05/2009 Document Revised: 03/16/2016 Document Reviewed: 11/13/2014 Elsevier Interactive Patient Education  2017 [employer].

## 2021-09-29 NOTE — Progress Notes (Signed)
Acute Office Visit  Subjective:    Patient ID: Vanessa Hogan, female    DOB: [birth year], 80 y.o.   MRN: [id number]  Chief Complaint  Patient presents with   Abdominal Pain    Pt states she started having sharp pains in her stomach that come and go 2 nights ago. States she feels the pains in her LL abdomen.     HPI Patient is in today for left lower abdominal pain for the last 3 days. The pain is sharp, and intermittent, lasts a second. Does not have any pain in between these sharp pains.   ABDOMINAL PAIN   Duration:days Onset: gradual Severity: severe Quality: sharp Location:  LLQ  Episode duration: 1 second Radiation: no Frequency: intermittent Alleviating factors: unsure Aggravating factors: unsure Status: fluctuating Treatments attempted: probiotic  Fever: no Nausea: no Vomiting: no Weight loss: no Decreased appetite: no Diarrhea: no Constipation: yes - a few days before  Blood in stool: no Heartburn: yes Jaundice: no Rash: no Dysuria/urinary frequency: no Hematuria: no History of sexually transmitted disease: no Recurrent NSAID use: no   Past Medical History:  Diagnosis Date   Anxiety    Depression    Diabetes mellitus without complication (HCC)    GERD (gastroesophageal reflux disease)    Gout    Hypertension     Past Surgical History:  Procedure Laterality Date   ABDOMINAL HYSTERECTOMY     BREAST SURGERY     HERNIA REPAIR     TUBAL LIGATION      Family History  Problem Relation Age of Onset   Stroke Mother    Stroke Father    Dementia Sister    Hypertension Brother    Diabetes Daughter    Hypertension Son     Social History   Socioeconomic History   Marital status: Married    Spouse name: Not on file   Number of children: Not on file   Years of education: Not on file   Highest education level: Not on file  Occupational History   Not on file  Tobacco Use   Smoking status: Former    Packs/day: 0.25    Years: 5.00     Pack years: 1.25    Types: Cigarettes   Smokeless tobacco: Former  Scientific laboratory technician Use: Never used  Substance and Sexual Activity   Alcohol use: Yes    Alcohol/week: 1.0 standard drink    Types: 1 Glasses of wine per week   Drug use: Never   Sexual activity: Not Currently  Other Topics Concern   Not on file  Social History Narrative   Not on file   Social Determinants of Health   Financial Resource Strain: Low Risk    Difficulty of Paying Living Expenses: Not hard at all  Food Insecurity: No Food Insecurity   Worried About Charity fundraiser in the Last Year: Never true   Ran Out of Food in the Last Year: Never true  Transportation Needs: Not on file  Physical Activity: Not on file  Stress: No Stress Concern Present   Feeling of Stress : Not at all  Social Connections: Moderately Isolated   Frequency of Communication with Friends and Family: More than three times a week   Frequency of Social Gatherings with Friends and Family: Three times a week   Attends Religious Services: More than 4 times per year   Active Member of Clubs or Organizations: No   Attends Archivist  Meetings: Never   Marital Status: Separated  Intimate Partner Violence: Not on file    Outpatient Medications Prior to Visit  Medication Sig Dispense Refill   cloNIDine (CATAPRES) 0.2 MG tablet Take 0.2 mg by mouth 2 (two) times daily.     colchicine 0.6 MG tablet Take 1 tablet (0.6 mg total) by mouth daily. 30 tablet 1   GLOBAL EASE INJECT PEN NEEDLES 32G X 4 MM MISC SMARTSIG:1 needle SUB-Q 3 Times Daily     HUMALOG MIX 75/25 KWIKPEN (75-25) 100 UNIT/ML KwikPen Inject into the skin. 18 units in the AM, 20 units in the PM     loratadine (CLARITIN) 10 MG tablet Take 10 mg by mouth daily.     LORazepam (ATIVAN) 0.5 MG tablet Take 1 tablet (0.5 mg total) by mouth as needed for anxiety. 20 tablet 0   omeprazole (PRILOSEC) 20 MG capsule Take 20 mg by mouth daily.     OZEMPIC, 0.25 OR 0.5  MG/DOSE, 2 MG/1.5ML SOPN Inject 0.5 mg into the skin once a week.     Probiotic Product (FORTIFY PROBIOTIC WOMENS PO) Take 1 tablet by mouth daily.     valsartan-hydrochlorothiazide (DIOVAN-HCT) 160-12.5 MG tablet Take 1 tablet by mouth daily.     citalopram (CELEXA) 10 MG tablet TAKE 1 TABLET BY MOUTH EVERY DAY (Patient not taking: Reported on 09/30/2021) 90 tablet 2   omeprazole (PRILOSEC) 40 MG capsule Take 1 capsule (40 mg total) by mouth daily. (Patient not taking: Reported on 09/30/2021) 30 capsule 3   No facility-administered medications prior to visit.    Allergies  Allergen Reactions   Asa [Aspirin]    Morphine And Related    Nitroglycerin Other (See Comments)    Headache   Penicillins    Sertraline Other (See Comments)    Felt poorly    Review of Systems  Constitutional: Negative.   HENT: Negative.    Respiratory: Negative.    Cardiovascular: Negative.   Gastrointestinal:  Positive for abdominal pain (intermittent LLQ) and constipation (several days ago). Negative for nausea and vomiting.  Genitourinary: Negative.   Musculoskeletal: Negative.   Skin: Negative.   Neurological: Negative.       Objective:    Physical Exam Vitals and nursing note reviewed.  Constitutional:      General: She is not in acute distress.    Appearance: Normal appearance.  HENT:     Head: Normocephalic.  Eyes:     Conjunctiva/sclera: Conjunctivae normal.  Cardiovascular:     Rate and Rhythm: Normal rate and regular rhythm.     Pulses: Normal pulses.     Heart sounds: Normal heart sounds.  Pulmonary:     Effort: Pulmonary effort is normal.     Breath sounds: Normal breath sounds.  Abdominal:     General: There is no distension.     Palpations: Abdomen is soft.     Tenderness: There is no abdominal tenderness. There is no guarding or rebound.  Musculoskeletal:     Cervical back: Normal range of motion.  Skin:    General: Skin is warm.  Neurological:     General: No focal deficit  present.     Mental Status: She is alert and oriented to person, place, and time.  Psychiatric:        Mood and Affect: Mood normal.        Behavior: Behavior normal.        Thought Content: Thought content normal.  Judgment: Judgment normal.    BP 124/74 (BP Location: Left Arm, Cuff Size: Large)   Pulse 77   Temp 98.3 F (36.8 C) (Oral)   Wt 210 lb 3.2 oz (95.3 kg)   SpO2 98%   BMI 34.43 kg/m  Wt Readings from Last 3 Encounters:  09/30/21 210 lb 3.2 oz (95.3 kg)  09/07/21 207 lb (93.9 kg)  08/31/21 207 lb (93.9 kg)    Health Maintenance Due  Topic Date Due   Pneumonia Vaccine 77+ Years old (1 - PCV) Never done   FOOT EXAM  Never done   OPHTHALMOLOGY EXAM  Never done   TETANUS/TDAP  Never done   Zoster Vaccines- Shingrix (1 of 2) Never done   DEXA SCAN  Never done   COVID-19 Vaccine (5 - Booster for Moderna series) 08/01/2021    There are no preventive care reminders to display for this patient.   No results found for: TSH Lab Results  Component Value Date   WBC 4.4 08/31/2021   HGB 14.5 08/31/2021   HCT 46.1 (H) 08/31/2021   MCV 76.2 (L) 08/31/2021   PLT 204 08/31/2021   Lab Results  Component Value Date   NA 139 08/31/2021   K 3.4 (L) 08/31/2021   CO2 30 08/31/2021   GLUCOSE 114 (H) 08/31/2021   BUN 11 08/31/2021   CREATININE 1.01 (H) 08/31/2021   CALCIUM 9.1 08/31/2021   ANIONGAP 7 08/31/2021   EGFR 63 07/04/2021   No results found for: CHOL No results found for: HDL No results found for: LDLCALC No results found for: TRIG No results found for: CHOLHDL Lab Results  Component Value Date   HGBA1C 6.7 (H) 07/04/2021       Assessment & Plan:   Problem List Items Addressed This Visit       Other   Left lower quadrant abdominal pain - Primary    3 sharp pains to LLQ lasting 1 second over the last 3 days. No red flags on exam. Recent BMP, CBC reviewed. U/A done which showed 1+ leukocytes. Will send for urine culture. With recent episode of  constipation, most likely related to this. Encouraged her to drink fluids, she can drink prune juice or take miralax as needed for constipation.       Relevant Orders   Urinalysis, Routine w reflex microscopic   Urine Culture     No orders of the defined types were placed in this encounter.    Charyl Dancer, NP

## 2021-09-30 ENCOUNTER — Ambulatory Visit (INDEPENDENT_AMBULATORY_CARE_PROVIDER_SITE_OTHER): Payer: Medicare Other | Admitting: Nurse Practitioner

## 2021-09-30 ENCOUNTER — Other Ambulatory Visit: Payer: Self-pay

## 2021-09-30 ENCOUNTER — Encounter: Payer: Self-pay | Admitting: Nurse Practitioner

## 2021-09-30 ENCOUNTER — Ambulatory Visit: Payer: Medicare Other | Admitting: Internal Medicine

## 2021-09-30 VITALS — BP 124/74 | HR 77 | Temp 98.3°F | Wt 210.2 lb

## 2021-09-30 DIAGNOSIS — R1032 Left lower quadrant pain: Secondary | ICD-10-CM | POA: Diagnosis not present

## 2021-09-30 LAB — URINALYSIS, ROUTINE W REFLEX MICROSCOPIC
Bilirubin, UA: NEGATIVE
Ketones, UA: NEGATIVE
Nitrite, UA: NEGATIVE
RBC, UA: NEGATIVE
Specific Gravity, UA: 1.025 (ref 1.005–1.030)
Urobilinogen, Ur: 1 mg/dL (ref 0.2–1.0)
pH, UA: 5.5 (ref 5.0–7.5)

## 2021-09-30 LAB — MICROSCOPIC EXAMINATION
Epithelial Cells (non renal): NONE SEEN /hpf (ref 0–10)
RBC, Urine: NONE SEEN /hpf (ref 0–2)

## 2021-09-30 NOTE — Assessment & Plan Note (Addendum)
3 sharp pains to LLQ lasting 1 second over the last 3 days. No red flags on exam. Recent BMP, CBC reviewed. U/A done which showed 1+ leukocytes. Will send for urine culture. With recent episode of constipation, most likely related to this. Encouraged her to drink fluids, she can drink prune juice or take miralax as needed for constipation.

## 2021-09-30 NOTE — Patient Instructions (Signed)
Drink plenty of fluids Symptoms may be from recent episode of constipation  Drink prune juice as needed Follow up if you have ongoing symptoms

## 2021-10-04 ENCOUNTER — Other Ambulatory Visit: Payer: Self-pay

## 2021-10-04 ENCOUNTER — Ambulatory Visit (INDEPENDENT_AMBULATORY_CARE_PROVIDER_SITE_OTHER): Payer: Medicare Other | Admitting: Nurse Practitioner

## 2021-10-04 ENCOUNTER — Encounter: Payer: Self-pay | Admitting: Nurse Practitioner

## 2021-10-04 VITALS — BP 121/77 | HR 73 | Temp 97.7°F | Wt 210.0 lb

## 2021-10-04 DIAGNOSIS — M109 Gout, unspecified: Secondary | ICD-10-CM | POA: Diagnosis not present

## 2021-10-04 LAB — URINE CULTURE

## 2021-10-04 MED ORDER — ALLOPURINOL 100 MG PO TABS: 100.0000 mg | ORAL_TABLET | Freq: Every day | ORAL | 1 refills | Status: DC

## 2021-10-04 MED ORDER — PREDNISONE 10 MG PO TABS: ORAL_TABLET | ORAL | 0 refills | Status: DC

## 2021-10-04 NOTE — Patient Instructions (Addendum)
Start allopurinol 1 tablet daily after your current symptoms go away.

## 2021-10-04 NOTE — Progress Notes (Signed)
Acute Office Visit  Subjective:    Patient ID: Vanessa Hogan, female    DOB: Dec 06, 1940, 80 y.o.   MRN: [id number]  Chief Complaint  Patient presents with   Gout    Patient states yesterday she noticed her right hand swollen and very painful. Patient states she had an old prescription from a previous gout flare in her foot and states that didn't help. Patient states she took Celebrex for pain. Patient states when she had the previous flare up in was in her left foot. Patient states without the medication this morning her pain level is an 11. With the medication it is a pain level 6.     HPI Patient is in today for right hand pain and swelling since yesterday.   GOUT  Duration:days Location: right hand Severity: 6/10  Quality: aching, pulsing Swelling: yes Redness: yes Trauma: no Recent dietary change or indiscretion: no Fevers: no Nausea/vomiting: no Aggravating factors: touching, bending fingers Alleviating factors: celebrex Status:  worse Treatments attempted: celebrex   Past Medical History:  Diagnosis Date   Anxiety    Depression    Diabetes mellitus without complication (HCC)    GERD (gastroesophageal reflux disease)    Gout    Hypertension     Past Surgical History:  Procedure Laterality Date   ABDOMINAL HYSTERECTOMY     BREAST SURGERY     HERNIA REPAIR     TUBAL LIGATION      Family History  Problem Relation Age of Onset   Stroke Mother    Stroke Father    Dementia Sister    Hypertension Brother    Diabetes Daughter    Hypertension Son     Social History   Socioeconomic History   Marital status: Married    Spouse name: Not on file   Number of children: Not on file   Years of education: Not on file   Highest education level: Not on file  Occupational History   Not on file  Tobacco Use   Smoking status: Former    Packs/day: 0.25    Years: 5.00    Pack years: 1.25    Types: Cigarettes   Smokeless tobacco: Former  IT trainer Use: Never used  Substance and Sexual Activity   Alcohol use: Yes    Alcohol/week: 1.0 standard drink    Types: 1 Glasses of wine per week   Drug use: Never   Sexual activity: Not Currently  Other Topics Concern   Not on file  Social History Narrative   Not on file   Social Determinants of Health   Financial Resource Strain: Low Risk    Difficulty of Paying Living Expenses: Not hard at all  Food Insecurity: No Food Insecurity   Worried About Charity fundraiser in the Last Year: Never true   Ran Out of Food in the Last Year: Never true  Transportation Needs: Not on file  Physical Activity: Not on file  Stress: No Stress Concern Present   Feeling of Stress : Not at all  Social Connections: Moderately Isolated   Frequency of Communication with Friends and Family: More than three times a week   Frequency of Social Gatherings with Friends and Family: Three times a week   Attends Religious Services: More than 4 times per year   Active Member of Clubs or Organizations: No   Attends Archivist Meetings: Never   Marital Status: Separated  Intimate Partner Violence: Not  on file    Outpatient Medications Prior to Visit  Medication Sig Dispense Refill   cloNIDine (CATAPRES) 0.2 MG tablet Take 0.2 mg by mouth 2 (two) times daily.     GLOBAL EASE INJECT PEN NEEDLES 32G X 4 MM MISC SMARTSIG:1 needle SUB-Q 3 Times Daily     HUMALOG MIX 75/25 KWIKPEN (75-25) 100 UNIT/ML KwikPen Inject into the skin. 18 units in the AM, 20 units in the PM     loratadine (CLARITIN) 10 MG tablet Take 10 mg by mouth daily.     LORazepam (ATIVAN) 0.5 MG tablet Take 1 tablet (0.5 mg total) by mouth as needed for anxiety. 20 tablet 0   omeprazole (PRILOSEC) 20 MG capsule Take 20 mg by mouth daily.     OZEMPIC, 0.25 OR 0.5 MG/DOSE, 2 MG/1.5ML SOPN Inject 0.5 mg into the skin once a week.     Probiotic Product (FORTIFY PROBIOTIC WOMENS PO) Take 1 tablet by mouth daily.      valsartan-hydrochlorothiazide (DIOVAN-HCT) 160-12.5 MG tablet Take 1 tablet by mouth daily.     citalopram (CELEXA) 10 MG tablet TAKE 1 TABLET BY MOUTH EVERY DAY (Patient not taking: Reported on 09/30/2021) 90 tablet 2   colchicine 0.6 MG tablet Take 1 tablet (0.6 mg total) by mouth daily. (Patient not taking: Reported on 10/04/2021) 30 tablet 1   omeprazole (PRILOSEC) 40 MG capsule Take 1 capsule (40 mg total) by mouth daily. (Patient not taking: Reported on 09/30/2021) 30 capsule 3   No facility-administered medications prior to visit.    Allergies  Allergen Reactions   Asa [Aspirin]    Morphine And Related    Nitroglycerin Other (See Comments)    Headache   Penicillins    Sertraline Other (See Comments)    Felt poorly    Review of Systems  Constitutional:  Positive for fatigue.  Respiratory: Negative.    Cardiovascular: Negative.   Genitourinary: Negative.   Musculoskeletal:  Positive for arthralgias (right hand pain and swelling).  Skin:  Positive for color change (redness to right hand).  Neurological: Negative.       Objective:    Physical Exam Vitals and nursing note reviewed.  Constitutional:      General: She is not in acute distress.    Appearance: Normal appearance.  HENT:     Head: Normocephalic.  Eyes:     Conjunctiva/sclera: Conjunctivae normal.  Cardiovascular:     Rate and Rhythm: Normal rate.     Pulses: Normal pulses.  Pulmonary:     Effort: Pulmonary effort is normal.  Musculoskeletal:        General: Swelling and tenderness present.     Cervical back: Normal range of motion.     Comments: Tenderness to palpation of right hand with redness and swelling. Limited ROM of fingers on right hand due to pain  Skin:    General: Skin is warm.     Findings: Erythema (right hand) present.  Neurological:     General: No focal deficit present.     Mental Status: She is alert and oriented to person, place, and time.  Psychiatric:        Mood and Affect: Mood  normal.        Behavior: Behavior normal.        Thought Content: Thought content normal.        Judgment: Judgment normal.    BP 121/77    Pulse 73    Temp 97.7 F (36.5 C)  Wt 210 lb (95.3 kg)    SpO2 98%    BMI 34.40 kg/m  Wt Readings from Last 3 Encounters:  10/04/21 210 lb (95.3 kg)  09/30/21 210 lb 3.2 oz (95.3 kg)  09/07/21 207 lb (93.9 kg)    Health Maintenance Due  Topic Date Due   Pneumonia Vaccine 58+ Years old (1 - PCV) Never done   FOOT EXAM  Never done   OPHTHALMOLOGY EXAM  Never done   TETANUS/TDAP  Never done   Zoster Vaccines- Shingrix (1 of 2) Never done   DEXA SCAN  Never done   COVID-19 Vaccine (5 - Booster for Moderna series) 08/01/2021    There are no preventive care reminders to display for this patient.   No results found for: TSH Lab Results  Component Value Date   WBC 4.4 08/31/2021   HGB 14.5 08/31/2021   HCT 46.1 (H) 08/31/2021   MCV 76.2 (L) 08/31/2021   PLT 204 08/31/2021   Lab Results  Component Value Date   NA 139 08/31/2021   K 3.4 (L) 08/31/2021   CO2 30 08/31/2021   GLUCOSE 114 (H) 08/31/2021   BUN 11 08/31/2021   CREATININE 1.01 (H) 08/31/2021   CALCIUM 9.1 08/31/2021   ANIONGAP 7 08/31/2021   EGFR 63 07/04/2021   No results found for: CHOL No results found for: HDL No results found for: LDLCALC No results found for: TRIG No results found for: CHOLHDL Lab Results  Component Value Date   HGBA1C 6.7 (H) 07/04/2021       Assessment & Plan:   Problem List Items Addressed This Visit       Musculoskeletal and Integument   Acute gout of right hand - Primary    Took colchicine at home from prior prescription which did not help. Will treat with prednisone taper. This is her second gout attack in 3 months. Uric acid levels were elevated at 8.1 in September 2022. Will start on allopurinol 168m daily once this flare resolves. Keep next scheduled appointment with PCP in 2 weeks, or sooner with concerns.       Relevant  Medications   predniSONE (DELTASONE) 10 MG tablet   allopurinol (ZYLOPRIM) 100 MG tablet     Meds ordered this encounter  Medications   predniSONE (DELTASONE) 10 MG tablet    Sig: Take 6 tablets today, then 5 tablets tomorrow, then decrease by 1 tablet every day until gone    Dispense:  21 tablet    Refill:  0   allopurinol (ZYLOPRIM) 100 MG tablet    Sig: Take 1 tablet (100 mg total) by mouth daily.    Dispense:  90 tablet    Refill:  1     LCharyl Dancer NP

## 2021-10-04 NOTE — Assessment & Plan Note (Signed)
Took colchicine at home from prior prescription which did not help. Will treat with prednisone taper. This is her second gout attack in 3 months. Uric acid levels were elevated at 8.1 in September 2022. Will start on allopurinol 100mg  daily once this flare resolves. Keep next scheduled appointment with PCP in 2 weeks, or sooner with concerns.

## 2021-10-11 ENCOUNTER — Other Ambulatory Visit: Payer: Self-pay | Admitting: Physician Assistant

## 2021-10-11 DIAGNOSIS — R42 Dizziness and giddiness: Secondary | ICD-10-CM

## 2021-10-11 DIAGNOSIS — R251 Tremor, unspecified: Secondary | ICD-10-CM

## 2021-10-11 DIAGNOSIS — R519 Headache, unspecified: Secondary | ICD-10-CM

## 2021-10-19 ENCOUNTER — Other Ambulatory Visit: Payer: Self-pay | Admitting: Internal Medicine

## 2021-10-19 NOTE — Telephone Encounter (Signed)
Requested Prescriptions  Pending Prescriptions Disp Refills   dexlansoprazole (DEXILANT) 60 MG capsule [Pharmacy Med Name: DEXLANSOPRAZOLE 60MG  DR CAPSULES] 90 capsule     Sig: TAKE 1 CAPSULE BY MOUTH EVERY DAY     Gastroenterology: Proton Pump Inhibitors Passed - 10/19/2021  8:47 PM      Passed - Valid encounter within last 12 months    Recent Outpatient Visits          2 weeks ago Acute gout of right hand, unspecified cause   [facility] McElwee, Lauren A, NP   2 weeks ago Left lower quadrant abdominal pain   [facility] McElwee, Lauren A, NP   1 month ago Gastroesophageal reflux disease, unspecified whether esophagitis present   [facility] Vigg, Avanti, MD   1 month ago Gastroesophageal reflux disease without esophagitis   [facility] Vigg, Avanti, MD   2 months ago Screening for osteoporosis   [facility] Vigg, Avanti, MD      Future Appointments            Tomorrow Vigg, Avanti, MD [facility], [facility]   In 1 week [facility], MD [facility], [facility]   In 1 week Vigg, Avanti, MD [facility], [facility]            colchicine 0.6 MG tablet [Pharmacy Med Name: COLCHICINE 0.6MG  TABLETS] 90 tablet     Sig: TAKE 1 TABLET BY MOUTH EVERY DAY     Endocrinology:  Gout Agents Failed - 10/19/2021  8:47 PM      Failed - Uric Acid in normal range and within 360 days    Uric Acid  Date Value Ref Range Status  07/21/2021 8.1 (H) 3.1 - 7.9 mg/dL Final    Comment:               Therapeutic target for gout patients: <6.0         Failed - Cr in normal range and within 360 days    Creatinine, Ser  Date Value Ref Range Status  08/31/2021 1.01 (H) 0.44 - 1.00 mg/dL Final         Passed - Valid encounter within last 12 months    Recent Outpatient Visits          2 weeks ago Acute gout of right hand, unspecified cause   [facility] McElwee, Lauren A, NP   2 weeks  ago Left lower quadrant abdominal pain   [facility] McElwee, Lauren A, NP   1 month ago Gastroesophageal reflux disease, unspecified whether esophagitis present   [facility] Vigg, Avanti, MD   1 month ago Gastroesophageal reflux disease without esophagitis   [facility] Vigg, Avanti, MD   2 months ago Screening for osteoporosis   [facility] Vigg, Avanti, MD      Future Appointments            Tomorrow Vigg, Avanti, MD [facility], [facility]   In 1 week [facility], Herbie [place], MD [facility], [facility]   In 1 week Vigg, [facility], MD [facility], [facility]

## 2021-10-19 NOTE — Telephone Encounter (Signed)
Requested medications are due for refill today.  no  Requested medications are on the active medications list.  no  Last refill. 09/07/2021 - Pt did not p/u meds  Future visit scheduled.   yes  Notes to clinic.  Medication was not taken by pt. Medication was too expensive. And D/c'd 09/13/2021.    Requested Prescriptions  Pending Prescriptions Disp Refills   dexlansoprazole (DEXILANT) 60 MG capsule [Pharmacy Med Name: DEXLANSOPRAZOLE 60MG  DR CAPSULES] 90 capsule     Sig: TAKE 1 CAPSULE BY MOUTH EVERY DAY     Gastroenterology: Proton Pump Inhibitors Passed - 10/19/2021  8:47 PM      Passed - Valid encounter within last 12 months    Recent Outpatient Visits           2 weeks ago Acute gout of right hand, unspecified cause   [facility] McElwee, Lauren A, NP   2 weeks ago Left lower quadrant abdominal pain   [facility] McElwee, Lauren A, NP   1 month ago Gastroesophageal reflux disease, unspecified whether esophagitis present   [facility] Vigg, Avanti, MD   1 month ago Gastroesophageal reflux disease without esophagitis   [facility] Vigg, Avanti, MD   2 months ago Screening for osteoporosis   [facility] Vigg, Avanti, MD       Future Appointments             Tomorrow Vigg, Avanti, MD [facility], [facility]   In 1 week [facility], MD [facility] [place], [facility]   In 1 week Vigg, Avanti, MD [facility], [facility]            Signed Prescriptions Disp Refills   colchicine 0.6 MG tablet 90 tablet 1    Sig: TAKE 1 TABLET BY MOUTH EVERY DAY     Endocrinology:  Gout Agents Failed - 10/19/2021  8:47 PM      Failed - Uric Acid in normal range and within 360 days    Uric Acid  Date Value Ref Range Status  07/21/2021 8.1 (H) 3.1 - 7.9 mg/dL Final    Comment:               Therapeutic target for gout patients: <6.0          Failed - Cr in normal range and within 360 days     Creatinine, Ser  Date Value Ref Range Status  08/31/2021 1.01 (H) 0.44 - 1.00 mg/dL Final          Passed - Valid encounter within last 12 months    Recent Outpatient Visits           2 weeks ago Acute gout of right hand, unspecified cause   [facility] McElwee, Lauren A, NP   2 weeks ago Left lower quadrant abdominal pain   [facility] McElwee, Lauren A, NP   1 month ago Gastroesophageal reflux disease, unspecified whether esophagitis present   [facility] Vigg, Avanti, MD   1 month ago Gastroesophageal reflux disease without esophagitis   [facility] Vigg, Avanti, MD   2 months ago Screening for osteoporosis   [facility] Vigg, Avanti, MD       Future Appointments             Tomorrow Vigg, Avanti, MD [facility], [facility]   In 1 week [facility], MD [facility], [facility]   In 1  week Vigg, Roma Schanz, MD [facility], [facility]

## 2021-10-20 ENCOUNTER — Other Ambulatory Visit: Payer: Self-pay

## 2021-10-20 ENCOUNTER — Ambulatory Visit (INDEPENDENT_AMBULATORY_CARE_PROVIDER_SITE_OTHER): Payer: Medicare Other | Admitting: Internal Medicine

## 2021-10-20 ENCOUNTER — Encounter: Payer: Self-pay | Admitting: Internal Medicine

## 2021-10-20 VITALS — BP 140/85 | HR 93 | Temp 98.7°F | Ht 65.35 in | Wt 202.6 lb

## 2021-10-20 DIAGNOSIS — E119 Type 2 diabetes mellitus without complications: Secondary | ICD-10-CM

## 2021-10-20 DIAGNOSIS — K219 Gastro-esophageal reflux disease without esophagitis: Secondary | ICD-10-CM

## 2021-10-20 DIAGNOSIS — R051 Acute cough: Secondary | ICD-10-CM

## 2021-10-20 LAB — VERITOR FLU A/B WAIVED
Influenza A: NEGATIVE
Influenza B: NEGATIVE

## 2021-10-20 MED ORDER — BENZONATATE 100 MG PO CAPS: 100.0000 mg | ORAL_CAPSULE | Freq: Three times a day (TID) | ORAL | 0 refills | Status: DC | PRN

## 2021-10-20 MED ORDER — FEXOFENADINE HCL 180 MG PO TABS: 180.0000 mg | ORAL_TABLET | Freq: Every day | ORAL | 1 refills | Status: DC

## 2021-10-20 MED ORDER — CLONIDINE HCL 0.2 MG PO TABS
0.2000 mg | ORAL_TABLET | Freq: Two times a day (BID) | ORAL | 1 refills | Status: DC
Start: 2021-10-20 — End: 2021-12-13

## 2021-10-20 MED ORDER — METFORMIN HCL 500 MG PO TABS: 500.0000 mg | ORAL_TABLET | Freq: Two times a day (BID) | ORAL | 3 refills | Status: DC

## 2021-10-20 NOTE — Progress Notes (Signed)
BP 140/85    Pulse 93    Temp 98.7 F (37.1 C) (Oral)    Ht 5' 5.35" (1.66 m)    Wt 202 lb 9.6 oz (91.9 kg)    SpO2 97%    BMI 33.35 kg/m    Subjective:    Patient ID: Vanessa Hogan, female    DOB: November 20, 1940, 80 y.o.   MRN: [id number]  Chief Complaint  Patient presents with   Diabetes    Has been running high since 10/05/21 when given steroids for gout in her hand.   Gastroesophageal Reflux   Cough    Since Friday, had a headache, no appetite, appetite has not really returned, has been taking OTC cough meds.     HPI: Vanessa Hogan is a 80 y.o. female  Diabetes She presents for her follow-up (high , was given steroids for gout  - had been rx this x 2 weeks ago) diabetic visit. She has type 2 diabetes mellitus. Pertinent negatives for hypoglycemia include no headaches or sweats. Pertinent negatives for diabetes include no chest pain and no weight loss.  Gastroesophageal Reflux She complains of coughing. She reports no chest pain, no heartburn, no sore throat or no wheezing. Pertinent negatives include no weight loss.  Cough This is a new (x friday - has been coughing up clear - phelgm cloudy had some sinus pressure.) problem. The current episode started in the past 7 days. Associated symptoms include chills, nasal congestion and postnasal drip. Pertinent negatives include no chest pain, ear congestion, ear pain, fever, headaches, heartburn, hemoptysis, myalgias, rash, rhinorrhea, sore throat, shortness of breath, sweats, weight loss or wheezing.   Chief Complaint  Patient presents with   Diabetes    Has been running high since 10/05/21 when given steroids for gout in her hand.   Gastroesophageal Reflux   Cough    Since Friday, had a headache, no appetite, appetite has not really returned, has been taking OTC cough meds.     Relevant past medical, surgical, family and social history reviewed and updated as indicated. Interim medical history since our last  visit reviewed. Allergies and medications reviewed and updated.  Review of Systems  Constitutional:  Positive for chills. Negative for fever and weight loss.  HENT:  Positive for postnasal drip. Negative for ear pain, rhinorrhea and sore throat.   Respiratory:  Positive for cough. Negative for hemoptysis, shortness of breath and wheezing.   Cardiovascular:  Negative for chest pain.  Gastrointestinal:  Negative for heartburn.  Musculoskeletal:  Negative for myalgias.  Skin:  Negative for rash.  Neurological:  Negative for headaches.   Per HPI unless specifically indicated above     Objective:    BP 140/85    Pulse 93    Temp 98.7 F (37.1 C) (Oral)    Ht 5' 5.35" (1.66 m)    Wt 202 lb 9.6 oz (91.9 kg)    SpO2 97%    BMI 33.35 kg/m   Wt Readings from Last 3 Encounters:  10/20/21 202 lb 9.6 oz (91.9 kg)  10/04/21 210 lb (95.3 kg)  09/30/21 210 lb 3.2 oz (95.3 kg)    Physical Exam  Results for orders placed or performed in visit on 09/30/21  Urine Culture   Specimen: Urine   UR  Result Value Ref Range   Urine Culture, Routine Final report    Organism ID, Bacteria Comment   Microscopic Examination   Urine  Result Value Ref Range  WBC, UA 0-5 0 - 5 /hpf   RBC None seen 0 - 2 /hpf   Epithelial Cells (non renal) None seen 0 - 10 /hpf   Mucus, UA Present (A) Not Estab.   Bacteria, UA Few (A) None seen/Few  Urinalysis, Routine w reflex microscopic  Result Value Ref Range   Specific Gravity, UA 1.025 1.005 - 1.030   pH, UA 5.5 5.0 - 7.5   Color, UA Yellow Yellow   Appearance Ur Cloudy (A) Clear   Leukocytes,UA 1+ (A) Negative   Protein,UA Trace (A) Negative/Trace   Glucose, UA Trace (A) Negative   Ketones, UA Negative Negative   RBC, UA Negative Negative   Bilirubin, UA Negative Negative   Urobilinogen, Ur 1.0 0.2 - 1.0 mg/dL   Nitrite, UA Negative Negative   Microscopic Examination See below:         Current Outpatient Medications:    allopurinol (ZYLOPRIM) 100  MG tablet, Take 1 tablet (100 mg total) by mouth daily., Disp: 90 tablet, Rfl: 1   benzonatate (TESSALON) 100 MG capsule, Take 1 capsule (100 mg total) by mouth 3 (three) times daily as needed for cough., Disp: 20 capsule, Rfl: 0   colchicine 0.6 MG tablet, TAKE 1 TABLET BY MOUTH EVERY DAY, Disp: 90 tablet, Rfl: 1   fexofenadine (ALLEGRA ALLERGY) 180 MG tablet, Take 1 tablet (180 mg total) by mouth daily., Disp: 10 tablet, Rfl: 1   GLOBAL EASE INJECT PEN NEEDLES 32G X 4 MM MISC, SMARTSIG:1 needle SUB-Q 3 Times Daily, Disp: , Rfl:    HUMALOG MIX 75/25 KWIKPEN (75-25) 100 UNIT/ML KwikPen, Inject into the skin. 18 units in the AM, 20 units in the PM, Disp: , Rfl:    LORazepam (ATIVAN) 0.5 MG tablet, Take 1 tablet (0.5 mg total) by mouth as needed for anxiety., Disp: 20 tablet, Rfl: 0   metFORMIN (GLUCOPHAGE) 500 MG tablet, Take 1 tablet (500 mg total) by mouth 2 (two) times daily with a meal., Disp: 60 tablet, Rfl: 3   omeprazole (PRILOSEC) 20 MG capsule, Take 20 mg by mouth daily., Disp: , Rfl:    OZEMPIC, 0.25 OR 0.5 MG/DOSE, 2 MG/1.5ML SOPN, Inject 0.5 mg into the skin once a week., Disp: , Rfl:    Probiotic Product (FORTIFY PROBIOTIC WOMENS PO), Take 1 tablet by mouth daily., Disp: , Rfl:    valsartan-hydrochlorothiazide (DIOVAN-HCT) 160-12.5 MG tablet, Take 1 tablet by mouth daily., Disp: , Rfl:    cloNIDine (CATAPRES) 0.2 MG tablet, Take 1 tablet (0.2 mg total) by mouth 2 (two) times daily., Disp: 90 tablet, Rfl: 1   omeprazole (PRILOSEC) 40 MG capsule, Take 1 capsule (40 mg total) by mouth daily. (Patient not taking: Reported on 09/30/2021), Disp: 30 capsule, Rfl: 3    Assessment & Plan:  DM is on humalog bid -- 75/25 20 am / 24 pm  Is on ozempic 0.5 mg for such continue such  Add metformin 500 mg bid for high sugars sec to steroids  check HbA1c, urine  microalbumin  diabetic diet plan given to pt  adviced regarding hypoglycemia and instructions given to pt today on how to prevent and treat  the same if it were to occur. pt acknowledges the plan and voices understanding of the same.  exercise plan given and encouraged.   Advice diabetic yearly podiatry, ophthalmology , nutritionist , dental check q 6 months.  2. URI : Flu and COVID tests were ordered today . pt advised to take Tylenol q 4-  6 hourly as needed. pt to take allegra q pm as needed and to call office if symptoms worsened pt verbalised understanding of such.   3. GOUT : recheck Uric acid level. not acutely inflamed.  Problem List Items Addressed This Visit       Digestive   Gastroesophageal reflux disease   Relevant Medications   cloNIDine (CATAPRES) 0.2 MG tablet     Endocrine   Diabetes mellitus without complication (HCC) (Chronic)   Relevant Medications   cloNIDine (CATAPRES) 0.2 MG tablet   metFORMIN (GLUCOPHAGE) 500 MG tablet   Other Relevant Orders   Uric acid   Comprehensive metabolic panel   Bayer DCA Hb P1S Waived (STAT)   Other Visit Diagnoses     Acute cough    -  Primary   Relevant Orders   Novel Coronavirus, NAA ([facility])   Veritor Flu A/B Waived        Orders Placed This Encounter  Procedures   Novel Coronavirus, NAA ([facility])   Uric acid   Comprehensive metabolic panel   Bayer DCA Hb R1R Waived (STAT)   Veritor Flu A/B Waived      Meds ordered this encounter  Medications   cloNIDine (CATAPRES) 0.2 MG tablet    Sig: Take 1 tablet (0.2 mg total) by mouth 2 (two) times daily.    Dispense:  90 tablet    Refill:  1   fexofenadine (ALLEGRA ALLERGY) 180 MG tablet    Sig: Take 1 tablet (180 mg total) by mouth daily.    Dispense:  10 tablet    Refill:  1   benzonatate (TESSALON) 100 MG capsule    Sig: Take 1 capsule (100 mg total) by mouth 3 (three) times daily as needed for cough.    Dispense:  20 capsule    Refill:  0   metFORMIN (GLUCOPHAGE) 500 MG tablet    Sig: Take 1 tablet (500 mg total) by mouth 2 (two) times daily with a meal.    Dispense:  60 tablet    Refill:  3

## 2021-10-21 LAB — NOVEL CORONAVIRUS, NAA: SARS-CoV-2, NAA: NOT DETECTED

## 2021-10-21 LAB — SARS-COV-2, NAA 2 DAY TAT

## 2021-10-23 DIAGNOSIS — K279 Peptic ulcer, site unspecified, unspecified as acute or chronic, without hemorrhage or perforation: Secondary | ICD-10-CM

## 2021-10-23 HISTORY — DX: Peptic ulcer, site unspecified, unspecified as acute or chronic, without hemorrhage or perforation: K27.9

## 2021-10-27 ENCOUNTER — Ambulatory Visit: Payer: Medicare Other | Admitting: Cardiology

## 2021-10-28 ENCOUNTER — Ambulatory Visit (INDEPENDENT_AMBULATORY_CARE_PROVIDER_SITE_OTHER): Payer: Medicare Other | Admitting: Internal Medicine

## 2021-10-28 ENCOUNTER — Other Ambulatory Visit: Payer: Self-pay

## 2021-10-28 ENCOUNTER — Telehealth: Payer: Self-pay | Admitting: Internal Medicine

## 2021-10-28 ENCOUNTER — Encounter: Payer: Self-pay | Admitting: Internal Medicine

## 2021-10-28 DIAGNOSIS — K219 Gastro-esophageal reflux disease without esophagitis: Secondary | ICD-10-CM

## 2021-10-28 DIAGNOSIS — E119 Type 2 diabetes mellitus without complications: Secondary | ICD-10-CM | POA: Diagnosis not present

## 2021-10-28 MED ORDER — OZEMPIC (1 MG/DOSE) 2 MG/1.5ML ~~LOC~~ SOPN: 1.0000 mg | PEN_INJECTOR | SUBCUTANEOUS | 3 refills | Status: DC

## 2021-10-28 MED ORDER — CEPHALEXIN 500 MG PO CAPS: 500.0000 mg | ORAL_CAPSULE | Freq: Two times a day (BID) | ORAL | 0 refills | Status: DC

## 2021-10-28 MED ORDER — CHERATUSSIN AC 100-10 MG/5ML PO SOLN: 5.0000 mL | Freq: Every evening | ORAL | 0 refills | Status: DC

## 2021-10-28 NOTE — Telephone Encounter (Signed)
Pharmacy is requesting PCP clarification on Rx dosing sent to pharmacy today.

## 2021-10-28 NOTE — Telephone Encounter (Signed)
Vanessa Hogan, from pharmacy stated she needs clarification on which MG/DOSE is needed for medication Semaglutide, 1 MG/DOSE, (OZEMPIC, 1 MG/DOSE,) 2 MG/1.5ML SOPN  Please advise.

## 2021-10-28 NOTE — Progress Notes (Signed)
BP 112/75    Pulse 90    Temp 98.4 F (36.9 C) (Oral)    Ht 5' 5.35" (1.66 m)    Wt 204 lb 9.6 oz (92.8 kg)    SpO2 96%    BMI 33.68 kg/m    Subjective:    Patient ID: Vanessa Hogan, female    DOB: October 22, 1941, 81 y.o.   MRN: [id number]  Chief Complaint  Patient presents with   Cough    Has been taking old prescription of Keflex 500mg , patient states instead of coughing all day it is only every 6 hrs while taking afore mentioned medication   Gastroesophageal Reflux   Diabetes    HPI: Vanessa Hogan is a 81 y.o. female  Cough This is a new (purulent phlegm from sinuses. is on keflex for such) problem.  Gastroesophageal Reflux She complains of coughing.  Diabetes She presents for her follow-up (a1c not done will check, fsbs - 134) diabetic visit. She has type 2 diabetes mellitus.   Chief Complaint  Patient presents with   Cough    Has been taking old prescription of Keflex 500mg , patient states instead of coughing all day it is only every 6 hrs while taking afore mentioned medication   Gastroesophageal Reflux   Diabetes    Relevant past medical, surgical, family and social history reviewed and updated as indicated. Interim medical history since our last visit reviewed. Allergies and medications reviewed and updated.  Review of Systems  Respiratory:  Positive for cough.    Per HPI unless specifically indicated above     Objective:    BP 112/75    Pulse 90    Temp 98.4 F (36.9 C) (Oral)    Ht 5' 5.35" (1.66 m)    Wt 204 lb 9.6 oz (92.8 kg)    SpO2 96%    BMI 33.68 kg/m   Wt Readings from Last 3 Encounters:  10/28/21 204 lb 9.6 oz (92.8 kg)  10/20/21 202 lb 9.6 oz (91.9 kg)  10/04/21 210 lb (95.3 kg)    Physical Exam Vitals and nursing note reviewed.  Constitutional:      General: She is not in acute distress.    Appearance: Normal appearance. She is not ill-appearing or diaphoretic.  Cardiovascular:     Rate and Rhythm: Normal rate  and regular rhythm.     Heart sounds: No murmur heard. Pulmonary:     Effort: Pulmonary effort is normal. No respiratory distress.     Breath sounds: Normal breath sounds. No wheezing, rhonchi or rales.  Skin:    General: Skin is warm and dry.     Coloration: Skin is not jaundiced.     Findings: No erythema.  Neurological:     Mental Status: She is alert.  Psychiatric:        Mood and Affect: Mood normal.        Behavior: Behavior normal.        Thought Content: Thought content normal.        Judgment: Judgment normal.   Results for orders placed or performed in visit on 10/20/21  Novel Coronavirus, NAA ([facility])   Specimen: Nasopharyngeal(NP) swabs in vial transport medium  Result Value Ref Range   SARS-CoV-2, NAA Not Detected Not Detected  SARS-COV-2, NAA 2 DAY TAT  Result Value Ref Range   SARS-CoV-2, NAA 2 DAY TAT Performed   Veritor Flu A/B Waived  Result Value Ref Range   Influenza A Negative Negative  Influenza B Negative Negative        Current Outpatient Medications:    allopurinol (ZYLOPRIM) 100 MG tablet, Take 1 tablet (100 mg total) by mouth daily., Disp: 90 tablet, Rfl: 1   benzonatate (TESSALON) 100 MG capsule, Take 1 capsule (100 mg total) by mouth 3 (three) times daily as needed for cough., Disp: 20 capsule, Rfl: 0   cephALEXin (KEFLEX) 500 MG capsule, Take 1 capsule (500 mg total) by mouth 2 (two) times daily for 4 days., Disp: 8 capsule, Rfl: 0   cloNIDine (CATAPRES) 0.2 MG tablet, Take 1 tablet (0.2 mg total) by mouth 2 (two) times daily., Disp: 90 tablet, Rfl: 1   colchicine 0.6 MG tablet, TAKE 1 TABLET BY MOUTH EVERY DAY, Disp: 90 tablet, Rfl: 1   fexofenadine (ALLEGRA ALLERGY) 180 MG tablet, Take 1 tablet (180 mg total) by mouth daily., Disp: 10 tablet, Rfl: 1   GLOBAL EASE INJECT PEN NEEDLES 32G X 4 MM MISC, SMARTSIG:1 needle SUB-Q 3 Times Daily, Disp: , Rfl:    HUMALOG MIX 75/25 KWIKPEN (75-25) 100 UNIT/ML KwikPen, Inject into the skin. 18  units in the AM, 20 units in the PM, Disp: , Rfl:    LORazepam (ATIVAN) 0.5 MG tablet, Take 1 tablet (0.5 mg total) by mouth as needed for anxiety., Disp: 20 tablet, Rfl: 0   metFORMIN (GLUCOPHAGE) 500 MG tablet, Take 1 tablet (500 mg total) by mouth 2 (two) times daily with a meal., Disp: 60 tablet, Rfl: 3   omeprazole (PRILOSEC) 40 MG capsule, Take 1 capsule (40 mg total) by mouth daily., Disp: 30 capsule, Rfl: 3   Probiotic Product (FORTIFY PROBIOTIC WOMENS PO), Take 1 tablet by mouth daily., Disp: , Rfl:    Semaglutide, 1 MG/DOSE, (OZEMPIC, 1 MG/DOSE,) 2 MG/1.5ML SOPN, Inject 1 mg into the skin once a week., Disp: 1.5 mL, Rfl: 3   valsartan-hydrochlorothiazide (DIOVAN-HCT) 160-12.5 MG tablet, Take 1 tablet by mouth daily., Disp: , Rfl:    omeprazole (PRILOSEC) 20 MG capsule, Take 20 mg by mouth daily. (Patient not taking: Reported on 10/28/2021), Disp: , Rfl:     Assessment & Plan:  URI:  Flu and CVID checked last  visit, both negative. pt advised to take Tylenol q 4- 6 hourly as needed. pt to take allegra q pm as needed and to call office if symptoms worsened pt verbalised understanding of such.    DM increase ozempic to 1 mg q weekly is on 0.5 mg takes metformin 500 mg once a day. Is on humalog per her request declines changing this.  check HbA1c,  urine  microalbumin  diabetic diet plan given to pt  adviced regarding hypoglycemia and instructions given to pt today on how to prevent and treat the same if it were to occur. pt acknowledges the plan and voices understanding of the same.  exercise plan given and encouraged.   advice diabetic yearly podiatry, ophthalmology , nutritionist , dental check q 6 months,  Problem List Items Addressed This Visit       Digestive   Gastroesophageal reflux disease     Endocrine   Diabetes mellitus without complication (HCC) (Chronic)   Relevant Medications   Semaglutide, 1 MG/DOSE, (OZEMPIC, 1 MG/DOSE,) 2 MG/1.5ML SOPN   Other Relevant Orders    Bayer DCA Hb A1c Waived   Basic metabolic panel   Vitamin 123456   Microalbumin, Urine Waived    Orders Placed This Encounter  Procedures   Bayer DCA Hb A1c Waived  Basic metabolic panel   Vitamin 123456   Microalbumin, Urine Waived     Meds ordered this encounter  Medications   cephALEXin (KEFLEX) 500 MG capsule    Sig: Take 1 capsule (500 mg total) by mouth 2 (two) times daily for 4 days.    Dispense:  8 capsule    Refill:  0   Semaglutide, 1 MG/DOSE, (OZEMPIC, 1 MG/DOSE,) 2 MG/1.5ML SOPN    Sig: Inject 1 mg into the skin once a week.    Dispense:  1.5 mL    Refill:  3     Follow up plan: No follow-ups on file.

## 2021-11-01 ENCOUNTER — Ambulatory Visit (INDEPENDENT_AMBULATORY_CARE_PROVIDER_SITE_OTHER): Payer: Medicare Other | Admitting: Gastroenterology

## 2021-11-01 ENCOUNTER — Other Ambulatory Visit: Payer: Self-pay | Admitting: Internal Medicine

## 2021-11-01 ENCOUNTER — Encounter: Payer: Self-pay | Admitting: Gastroenterology

## 2021-11-01 VITALS — BP 169/83 | HR 98 | Temp 98.3°F | Ht 65.35 in | Wt 208.2 lb

## 2021-11-01 DIAGNOSIS — R1030 Lower abdominal pain, unspecified: Secondary | ICD-10-CM | POA: Diagnosis not present

## 2021-11-01 DIAGNOSIS — R718 Other abnormality of red blood cells: Secondary | ICD-10-CM

## 2021-11-01 NOTE — Progress Notes (Signed)
Vanessa Repress, MD [address]  [address]  [place], [place] [address]  Main: [phone]  Fax: [phone]    Gastroenterology Consultation  Referring Provider:     Loura Pardon, MD Primary Care Physician:  Vanessa Pardon, MD Primary Gastroenterologist:  Dr. Arlyss Hogan Reason for Consultation:     Lower abdominal discomfort        HPI:   Vanessa Hogan is a 81 y.o. female referred by Dr. Loura Pardon, MD  for consultation & management of lower abdominal discomfort.  Patient reports that she has been taking omeprazole 20 mg daily for at least 10 years for reflux.  She reports that about 2 weeks ago, she experienced lower abdominal discomfort which was vague and not associated with any aggravating or relieving factors.  She reports having regular bowel movements, 1-2 times daily, denies any rectal bleeding or abdominal bloating.  She was told that it might be indigestion and therefore her omeprazole was increased to 40 mg daily for last 2 weeks.  She currently does not have lower abdominal discomfort.  Patient denies any other GI symptoms.  Her labs reveal chronic microcytosis without evidence of anemia.  She reports having undergoing colonoscopy in [place] about 5 years ago.  She was told that she had polyps that were removed.  She does not know when she is due for next colonoscopy. Patient tends to develop lactose intolerance and tries to avoid milk products Patient does not smoke or drink alcohol She denies consumption of carbonated beverages She denies any food intolerance  NSAIDs: None  Antiplts/Anticoagulants/Anti thrombotics: None  GI Procedures: Reports undergoing colonoscopy about 5 years ago in [place], found to have polyps, report not available  Past Medical History:  Diagnosis Date   Anxiety    Depression    Diabetes mellitus without complication (HCC)    GERD (gastroesophageal reflux disease)    Gout    Hypertension     Past Surgical History:   Procedure Laterality Date   ABDOMINAL HYSTERECTOMY     BREAST SURGERY     HERNIA REPAIR     TUBAL LIGATION     Current Outpatient Medications:    allopurinol (ZYLOPRIM) 100 MG tablet, Take 1 tablet (100 mg total) by mouth daily., Disp: 90 tablet, Rfl: 1   cloNIDine (CATAPRES) 0.2 MG tablet, Take 1 tablet (0.2 mg total) by mouth 2 (two) times daily., Disp: 90 tablet, Rfl: 1   colchicine 0.6 MG tablet, TAKE 1 TABLET BY MOUTH EVERY DAY, Disp: 90 tablet, Rfl: 1   GLOBAL EASE INJECT PEN NEEDLES 32G X 4 MM MISC, SMARTSIG:1 needle SUB-Q 3 Times Daily, Disp: , Rfl:    HUMALOG MIX 75/25 KWIKPEN (75-25) 100 UNIT/ML KwikPen, Inject into the skin. 18 units in the AM, 20 units in the PM, Disp: , Rfl:    LORazepam (ATIVAN) 0.5 MG tablet, Take 1 tablet (0.5 mg total) by mouth as needed for anxiety., Disp: 20 tablet, Rfl: 0   metFORMIN (GLUCOPHAGE) 500 MG tablet, Take 1 tablet (500 mg total) by mouth 2 (two) times daily with a meal., Disp: 60 tablet, Rfl: 3   omeprazole (PRILOSEC) 40 MG capsule, Take 1 capsule (40 mg total) by mouth daily., Disp: 30 capsule, Rfl: 3   Probiotic Product (FORTIFY PROBIOTIC WOMENS PO), Take 1 tablet by mouth daily., Disp: , Rfl:    Semaglutide, 1 MG/DOSE, (OZEMPIC, 1 MG/DOSE,) 2 MG/1.5ML SOPN, Inject 1 mg into the skin once a week., Disp: 1.5  mL, Rfl: 3   valsartan-hydrochlorothiazide (DIOVAN-HCT) 160-12.5 MG tablet, Take 1 tablet by mouth daily., Disp: , Rfl:    omeprazole (PRILOSEC) 20 MG capsule, Take 20 mg by mouth daily. (Patient not taking: Reported on 11/01/2021), Disp: , Rfl:     Family History  Problem Relation Age of Onset   Stroke Mother    Stroke Father    Dementia Sister    Hypertension Brother    Diabetes Daughter    Hypertension Son      Social History   Tobacco Use   Smoking status: Former    Packs/day: 0.25    Years: 5.00    Pack years: 1.25    Types: Cigarettes   Smokeless tobacco: Former  Building services engineer Use: Never used  Substance Use  Topics   Alcohol use: Yes    Alcohol/week: 1.0 standard drink    Types: 1 Glasses of wine per week   Drug use: Never    Allergies as of 11/01/2021 - Review Complete 11/01/2021  Allergen Reaction Noted   Asa [aspirin]  07/04/2021   Morphine and related  07/04/2021   Nitroglycerin Other (See Comments) 03/05/2020   Penicillins  07/04/2021   Sertraline Other (See Comments) 05/19/2020    Review of Systems:    All systems reviewed and negative except where noted in HPI.   Physical Exam:  BP (!) 169/83 (BP Location: Left Arm, Patient Position: Sitting, Cuff Size: Normal)    Pulse 98    Temp 98.3 F (36.8 C) (Oral)    Ht 5' 5.35" (1.66 m)    Wt 208 lb 4 oz (94.5 kg)    BMI 34.28 kg/m  No LMP recorded. Patient is postmenopausal.  General:   Alert,  Well-developed, well-nourished, pleasant and cooperative in NAD Head:  Normocephalic and atraumatic. Eyes:  Sclera clear, no icterus.   Conjunctiva pink. Ears:  Normal auditory acuity. Nose:  No deformity, discharge, or lesions. Mouth:  No deformity or lesions,oropharynx pink & moist. Neck:  Supple; no masses or thyromegaly. Lungs:  Respirations even and unlabored.  Clear throughout to auscultation.   No wheezes, crackles, or rhonchi. No acute distress. Heart:  Regular rate and rhythm; no murmurs, clicks, rubs, or gallops. Abdomen:  Normal bowel sounds. Soft, non-tender and non-distended without masses, hepatosplenomegaly or hernias noted.  No guarding or rebound tenderness.   Rectal: Not performed Msk:  Symmetrical without gross deformities. Good, equal movement & strength bilaterally. Pulses:  Normal pulses noted. Extremities:  No clubbing or edema.  No cyanosis. Neurologic:  Alert and oriented x3;  grossly normal neurologically. Skin:  Intact without significant lesions or rashes. No jaundice. Lymph Nodes:  No significant cervical adenopathy. Psych:  Alert and cooperative. Normal mood and affect.  Imaging  Studies: Reviewed  Assessment and Plan:   Vanessa Hogan is a 81 y.o. female with metabolic syndrome is seen in consultation for an isolated episode of lower abdominal discomfort without any other constitutional symptoms or aggravating or relieving factors.  No other associated GI symptoms  Recommend ultrasound abdomen and pelvis Advised patient to decrease omeprazole to 20 mg daily as she was previously taking for reflux Further recommendations based on the ultrasound results.  If her symptoms return and ultrasound normal, recommend colonoscopy and possibly CT abdomen and pelvis with contrast  Microcytosis Check iron panel   Follow up in 3 to 4 months   Vanessa Repress, MD

## 2021-11-01 NOTE — Patient Instructions (Addendum)
Your Ultrasound is schedule for 11/04/2021 Arrived 8:30am at the medical Mall at [facility]. Nothing to eat or drink after midnight.

## 2021-11-01 NOTE — Telephone Encounter (Signed)
Spoke with [facility] and states she is unsure why the pharmacy contacted our office for clarification regarding patient's Ozempic prescription. Pharmacist states the prescription is written exactly how it was sent over by Dr.Vigg.

## 2021-11-02 LAB — IRON,TIBC AND FERRITIN PANEL
Ferritin: 297 ng/mL — ABNORMAL HIGH (ref 15–150)
Iron Saturation: 15 % (ref 15–55)
Iron: 48 ug/dL (ref 27–139)
Total Iron Binding Capacity: 310 ug/dL (ref 250–450)
UIBC: 262 ug/dL (ref 118–369)

## 2021-11-04 ENCOUNTER — Ambulatory Visit: Payer: Medicare Other

## 2021-11-04 ENCOUNTER — Other Ambulatory Visit: Payer: Self-pay

## 2021-11-04 ENCOUNTER — Ambulatory Visit
Admission: RE | Admit: 2021-11-04 | Discharge: 2021-11-04 | Disposition: A | Discharge status: Home / Self Care | Payer: Medicare Other | Source: Ambulatory Visit | Attending: Gastroenterology | Admitting: Gastroenterology

## 2021-11-04 ENCOUNTER — Other Ambulatory Visit: Payer: Medicare Other

## 2021-11-04 DIAGNOSIS — R1030 Lower abdominal pain, unspecified: Secondary | ICD-10-CM | POA: Insufficient documentation

## 2021-11-07 ENCOUNTER — Other Ambulatory Visit: Payer: Self-pay | Admitting: Internal Medicine

## 2021-11-07 ENCOUNTER — Telehealth: Payer: Self-pay

## 2021-11-07 ENCOUNTER — Ambulatory Visit: Payer: Medicare Other | Admitting: Podiatry

## 2021-11-07 DIAGNOSIS — N840 Polyp of corpus uteri: Secondary | ICD-10-CM

## 2021-11-07 NOTE — Progress Notes (Signed)
Thank you ! Will have her touch base with Obgyn

## 2021-11-07 NOTE — Telephone Encounter (Signed)
Patient verbalized understanding of results  

## 2021-11-07 NOTE — Telephone Encounter (Signed)
-----   Message from [name], MD sent at 11/06/2021  7:38 PM EST ----- Dr. Neomia Dear  Please take a look at her ultrasound pelvis results.  She will need referral to OB/GYN for biopsy.  If this is negative for malignancy, next step would be colonoscopy  Caryl Pina  Please call patient and inform her that her ultrasound pelvis showed a polyp in her uterus that needs to be biopsied.  Her PCP will place referral to [place]

## 2021-11-07 NOTE — Progress Notes (Signed)
I think she already sees Obgyn for a recnet surgeyr yes? Otherwise pl refer asap thnx

## 2021-11-07 NOTE — Progress Notes (Signed)
Sending in a referal pl see if [facility] can get this pt in soon thnx.

## 2021-11-08 ENCOUNTER — Other Ambulatory Visit: Payer: Self-pay | Admitting: Internal Medicine

## 2021-11-08 NOTE — Telephone Encounter (Signed)
Requested medication (s) are due for refill today - no  Requested medication (s) are on the active medication list -no  Future visit scheduled -yes  Last refill: 10/28/21  Notes to clinic: Request RF: medication not assigned protocol.   Requested Prescriptions  Pending Prescriptions Disp Refills   cephALEXin (KEFLEX) 500 MG capsule [Pharmacy Med Name: CEPHALEXIN 500MG  CAPSULES] 8 capsule 0    Sig: TAKE 1 CAPSULE(500 MG) BY MOUTH TWICE DAILY FOR 4 DAYS     Off-Protocol Failed - 11/08/2021  3:10 PM      Failed - Medication not assigned to a protocol, review manually.      Passed - Valid encounter within last 12 months    Recent Outpatient Visits           1 week ago Diabetes mellitus without complication (HCC)   [facility] Vigg, Avanti, MD   2 weeks ago Acute cough   [facility] Vigg, Avanti, MD   1 month ago Acute gout of right hand, unspecified cause   [facility] McElwee, Lauren A, NP   1 month ago Left lower quadrant abdominal pain   [facility] McElwee, Lauren A, NP   1 month ago Gastroesophageal reflux disease, unspecified whether esophagitis present   [facility] Vigg, Avanti, MD       Future Appointments             In 2 days 11/10/2021, MD [facility], [facility]   In 2 months Vigg, Avanti, MD [facility]   In 2 months Vanga, [facility], MD [facility] [place]               Requested Prescriptions  Pending Prescriptions Disp Refills   cephALEXin (KEFLEX) 500 MG capsule [Pharmacy Med Name: CEPHALEXIN 500MG  CAPSULES] 8 capsule 0    Sig: TAKE 1 CAPSULE(500 MG) BY MOUTH TWICE DAILY FOR 4 DAYS     Off-Protocol Failed - 11/08/2021  3:10 PM      Failed - Medication not assigned to a protocol, review manually.      Passed - Valid encounter within last 12 months    Recent Outpatient Visits           1 week ago Diabetes mellitus without complication  (HCC)   [facility] Vigg, Avanti, MD   2 weeks ago Acute cough   [facility] Vigg, Avanti, MD   1 month ago Acute gout of right hand, unspecified cause   [facility] McElwee, Lauren A, NP   1 month ago Left lower quadrant abdominal pain   [facility] McElwee, Lauren A, NP   1 month ago Gastroesophageal reflux disease, unspecified whether esophagitis present   [facility] Vigg, Avanti, MD       Future Appointments             In 2 days , MD [facility], [facility]   In 2 months Vigg, Avanti, MD [facility], [facility]   In 2 months Vanga, [facility], MD [facility] [place]

## 2021-11-10 ENCOUNTER — Encounter: Payer: Self-pay | Admitting: Cardiology

## 2021-11-10 ENCOUNTER — Ambulatory Visit: Payer: Medicare Other | Admitting: Cardiology

## 2021-11-10 ENCOUNTER — Other Ambulatory Visit: Payer: Self-pay

## 2021-11-10 VITALS — BP 120/68 | HR 88 | Ht 65.0 in | Wt 209.0 lb

## 2021-11-10 DIAGNOSIS — R0789 Other chest pain: Secondary | ICD-10-CM | POA: Diagnosis not present

## 2021-11-10 DIAGNOSIS — E1165 Type 2 diabetes mellitus with hyperglycemia: Secondary | ICD-10-CM

## 2021-11-10 DIAGNOSIS — Z794 Long term (current) use of insulin: Secondary | ICD-10-CM

## 2021-11-10 DIAGNOSIS — R42 Dizziness and giddiness: Secondary | ICD-10-CM | POA: Diagnosis not present

## 2021-11-10 DIAGNOSIS — I10 Essential (primary) hypertension: Secondary | ICD-10-CM

## 2021-11-10 MED ORDER — LABETALOL HCL 100 MG PO TABS: 100.0000 mg | ORAL_TABLET | Freq: Two times a day (BID) | ORAL | 0 refills | Status: DC

## 2021-11-10 NOTE — Assessment & Plan Note (Signed)
Managed by PCP.  On insulin and Ozempic.  No major issues.

## 2021-11-10 NOTE — Assessment & Plan Note (Signed)
She is not having any more chest discomfort.  Has not had any since I saw her.  She says this is somewhat she has had off-and-on for years and has been evaluated the past.  Every time his come back negative.  She is fine with no further evaluation.  At this point I will defer management to PCP.  She can come back to [place] on an as-needed basis.

## 2021-11-10 NOTE — Progress Notes (Signed)
Primary Care Provider: Charlynne Cousins, MD Cardiologist: None Electrophysiologist: None  Clinic Note: Chief Complaint  Patient presents with   Follow-up    2 month follow up -- Patient denies and complaints at this time. Meds reviewed verbally with patient.     ===================================  ASSESSMENT/PLAN   Problem List Items Addressed This Visit       Cardiology Problems   Essential hypertension (Chronic)    Blood pressure X looks pretty well controlled.  She has clearly tried multiple different medications, and seems to be stable.  I do not see on the med list listed for me initially that she was on labetalol, but she has listed here prescribed medications and says that she was on labetalol.  She takes clonidine plus Diovan HCTZ.  At this point I think we will just simply leave her medications alone.  Her pressures are well controlled she is on a clonidine that she takes once a day.  Will defer management to PCP as she seems to be pretty well controlled.        Other   Intermittent right-sided chest pain - Primary (Chronic)    She is not having any more chest discomfort.  Has not had any since I saw her.  She says this is somewhat she has had off-and-on for years and has been evaluated the past.  Every time his come back negative.  She is fine with no further evaluation.  At this point I will defer management to PCP.  She can come back to [place] on an as-needed basis.      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  On insulin and Ozempic.  No major issues.       ===================================  HPI:    Vanessa Hogan is a 81 y.o. female former long-term smoker with a PMH notable for HTN, HLD and DM-2 who presents today for follow-up evaluation of CHEST PAIN AND SHORTNESS OF BREATH.  Originally seen in consultation at the request of Vanessa Cousins, MD and Vanessa Cass,  MD -> in response to ER visit on 08/13/2021.  Vanessa Hogan was seen on August 25, 2021 for evaluation of her chest pain.  She noted right upper chest/mid axillary line pain feeling like a pulling sensation.  With or without exertion.  The triggering episode occurred after having him major fright trying to kill a large bug.  She then proceeded to walk her dog.   The pain did not get worse while walking her dog.  She does note intermittent episodes of this type of symptom in the past.  She said that these symptoms were evaluated in the past with a treadmill stress test and a couple Lexiscan Myoview's that were all negative. -> Plan was to reassess in 2 to 3 months to see how her symptoms are going.  If still present, can then proceed with ischemic evaluation.  Also consider increasing valsartan-HCTZ dose if blood pressure continued to be elevated.  Recent Hospitalizations:  ED visit for dizziness on 08/31/2021 -> ruled out for infection and stroke.  Reviewed  CV studies:    The following studies were reviewed today: (if available, images/films reviewed: From Epic Chart or Care Everywhere) No new studies:   Interval History:   Vanessa Hogan returns here today for follow-up indicating that she is feeling well.  He has not had any more the chest  pain episodes.  No palpitations or irregular heartbeats.  About the only notable symptom that she has had since I saw her was gout.  Otherwise she tells me her blood pressures been doing well.  No edema no PND or orthopnea.  CV Review of Symptoms (Summary) Cardiovascular ROS: no chest pain or dyspnea on exertion negative for - edema, irregular heartbeat, orthopnea, palpitations, paroxysmal nocturnal dyspnea, rapid heart rate, shortness of breath, or lightheadedness, dizziness or wooziness, syncope/near syncope or TIA/amaurosis fugax, medication  REVIEWED OF SYSTEMS   Pertinent cardiac symptoms noted above. Still has off-and-on  constipation and heartburn.  Some mild myalgias.  Rarely dizzy. Does not have as much energy as she used to have when she was younger. Has gout.  I have reviewed and (if needed) personally updated the patient's problem list, medications, allergies, past medical and surgical history, social and family history.   PAST MEDICAL HISTORY   Past Medical History:  Diagnosis Date   Anxiety    Depression    Diabetes mellitus without complication ([place])    GERD (gastroesophageal reflux disease)    Gout    Hypertension     PAST SURGICAL HISTORY   Past Surgical History:  Procedure Laterality Date   ABDOMINAL HYSTERECTOMY     BREAST SURGERY     HERNIA REPAIR     TUBAL LIGATION      Immunization History  Administered Date(s) Administered   Moderna SARS-COV2 Booster Vaccination 01/29/2020, 02/26/2020   Moderna Sars-Covid-2 Vaccination 01/29/2020, 02/26/2020, 11/18/2020, 06/06/2021    MEDICATIONS/ALLERGIES   Current Meds  Medication Sig   allopurinol (ZYLOPRIM) 100 MG tablet Take 1 tablet (100 mg total) by mouth daily.   cloNIDine (CATAPRES) 0.2 MG tablet Take 1 tablet (0.2 mg total) by mouth 2 (two) times daily.   GLOBAL EASE INJECT PEN NEEDLES 32G X 4 MM MISC SMARTSIG:1 needle SUB-Q 3 Times Daily   HUMALOG MIX 75/25 KWIKPEN (75-25) 100 UNIT/ML KwikPen Inject into the skin. 18 units in the AM, 20 units in the PM   LORazepam (ATIVAN) 0.5 MG tablet Take 1 tablet (0.5 mg total) by mouth as needed for anxiety.   metFORMIN (GLUCOPHAGE) 500 MG tablet Take 1 tablet (500 mg total) by mouth 2 (two) times daily with a meal.   omeprazole (PRILOSEC) 20 MG capsule Take 20 mg by mouth daily.   omeprazole (PRILOSEC) 40 MG capsule Take 1 capsule (40 mg total) by mouth daily.   Probiotic Product (FORTIFY PROBIOTIC WOMENS PO) Take 1 tablet by mouth daily.   Semaglutide, 1 MG/DOSE, (OZEMPIC, 1 MG/DOSE,) 2 MG/1.5ML SOPN Inject 1 mg into the skin once a week.   valsartan-hydrochlorothiazide (DIOVAN-HCT)  160-12.5 MG tablet Take 1 tablet by mouth daily.   [DISCONTINUED] labetalol (NORMODYNE) 100 MG tablet Take 1 tablet (100 mg total) by mouth 2 (two) times daily.   We reviewed her medications and she shows that she did not tolerate diltiazem because of headaches.  Micardis and Micardis HCTZ she had vision problems and weight gain.  When she used Azor/amlodipine almost heart and she had swelling in her feet.  Muscle pains and headaches with transient HCTZ.  Losartan alone she had muscle cramps and headaches.  Benazepril gave her headaches and hair loss.  Lisinopril gave her body aches weight gain headaches and hair loss.  Atenolol caused headaches dizziness.  Blood pressure, headache and pain in the.  As such, she has been stable on current dose of labetalol  Allergies  Allergen Reactions   Diona Fanti [  Aspirin]    Morphine And Related    Nitroglycerin Other (See Comments)    Headache   Penicillins    Sertraline Other (See Comments)    Felt poorly    SOCIAL HISTORY/FAMILY HISTORY   Reviewed in Epic:  Pertinent findings:  Social History   Tobacco Use   Smoking status: Former    Packs/day: 0.25    Years: 5.00    Pack years: 1.25    Types: Cigarettes   Smokeless tobacco: Former  Scientific laboratory technician Use: Never used  Substance Use Topics   Alcohol use: Yes    Alcohol/week: 1.0 standard drink    Types: 1 Glasses of wine per week   Drug use: Never   Social History   Social History Narrative   Not on file    OBJCTIVE -PE, EKG, labs   Wt Readings from Last 3 Encounters:  11/10/21 209 lb (94.8 kg)  11/01/21 208 lb 4 oz (94.5 kg)  10/28/21 204 lb 9.6 oz (92.8 kg)    Physical Exam: BP 120/68 (BP Location: Left Arm, Patient Position: Sitting, Cuff Size: Normal)    Pulse 88    Ht 5\' 5"  (1.651 m)    Wt 209 lb (94.8 kg)    SpO2 99%    BMI 34.78 kg/m  Physical Exam Vitals reviewed.  Constitutional:      General: She is not in acute distress.    Appearance: Normal appearance. She is  obese.  HENT:     Head: Normocephalic and atraumatic.  Cardiovascular:     Rate and Rhythm: Normal rate and regular rhythm.     Pulses: Normal pulses.     Heart sounds: Heart sounds are distant. Murmur (Grade 1/6 SEM at RUSB.) heard.    No friction rub. Gallop present. S4 (Cannot exclude S4 versus split S2) sounds present.     Comments: Normal S1 with probable split S2.  15 1516 1515 none Pulmonary:     Effort: Pulmonary effort is normal. No respiratory distress.     Breath sounds: Normal breath sounds. No wheezing, rhonchi or rales.  Musculoskeletal:     Cervical back: Normal range of motion and neck supple.  Neurological:     Mental Status: She is alert.    Adult ECG Report Not checked  Recent Labs: Reviewed No results found for: CHOL, HDL, LDLCALC, LDLDIRECT, TRIG, CHOLHDL Lab Results  Component Value Date   CREATININE 1.01 (H) 08/31/2021   BUN 11 08/31/2021   NA 139 08/31/2021   K 3.4 (L) 08/31/2021   CL 102 08/31/2021   CO2 30 08/31/2021   CBC Latest Ref Rng & Units 08/31/2021 08/13/2021 07/21/2021  WBC 4.0 - 10.5 K/uL 4.4 4.3 5.6  Hemoglobin 12.0 - 15.0 g/dL 14.5 14.8 14.2  Hematocrit 36.0 - 46.0 % 46.1(H) 44.8 44.7  Platelets 150 - 400 K/uL 204 220 247    Lab Results  Component Value Date   HGBA1C 6.7 (H) 07/04/2021   No results found for: TSH  ==================================================  COVID-19 Education: The signs and symptoms of COVID-19 were discussed with the patient and how to seek care for testing (follow up with PCP or arrange E-visit).    I spent a total of 15 minutes with the patient spent in direct patient consultation.  Additional time spent with chart review  / charting (studies, outside notes, etc): 15 min Total Time: 31 min  Current medicines are reviewed at length with the patient today.  (+/- concerns) none  This visit occurred during the SARS-CoV-2 public health emergency.  Safety protocols were in place, including screening  questions prior to the visit, additional usage of staff PPE, and extensive cleaning of exam room while observing appropriate contact time as indicated for disinfecting solutions.  Notice: This dictation was prepared with Dragon dictation along with smart phrase technology. Any transcriptional errors that result from this process are unintentional and may not be corrected upon review.  Studies Ordered:  No orders of the defined types were placed in this encounter.   Patient Instructions / Medication Changes & Studies & Tests Ordered   Patient Instructions  Medication Instructions:  Your physician recommends that you continue on your current medications as directed. Please refer to the Current Medication list given to you today.   *If you need a refill on your cardiac medications before your next appointment, please call your pharmacy*   Lab Work: None ordered  If you have labs (blood work) drawn today and your tests are completely normal, you will receive your results only by: [place] (if you have MyChart) OR A paper copy in the mail If you have any lab test that is abnormal or we need to change your treatment, we will call you to review the results.   Testing/Procedures: None ordered   Follow-Up: At [facility], you and your health needs are our priority.  As part of our continuing mission to provide you with exceptional heart care, we have created designated Provider Care Teams.  These Care Teams include your primary Cardiologist (physician) and Advanced Practice Providers (APPs -  Physician Assistants and Nurse Practitioners) who all work together to provide you with the care you need, when you need it.  We recommend signing up for the patient portal called "MyChart".  Sign up information is provided on this After Visit Summary.  MyChart is used to connect with patients for Virtual Visits (Telemedicine).  Patients are able to view lab/test results, encounter notes,  upcoming appointments, etc.  Non-urgent messages can be sent to your provider as well.   To learn more about what you can do with MyChart, go to [email or web address].    Your next appointment:   As needed  The format for your next appointment:   In Person  Provider:   You may see Glenetta Hew, MD or one of the following Advanced Practice Providers on your designated Care Team:   Murray Hodgkins, NP Christell Faith, PA-C Cadence Kathlen Mody, PA-C  :1}    Other Instructions N/A      Glenetta Hew, M.D., M.S. Interventional Cardiologist   Pager # [phone] Phone # [phone] [address]. [place], [place] [address]   Thank you for choosing [facility] [place]!!

## 2021-11-10 NOTE — Assessment & Plan Note (Signed)
Blood pressure X looks pretty well controlled.  She has clearly tried multiple different medications, and seems to be stable.  I do not see on the med list listed for me initially that she was on labetalol, but she has listed here prescribed medications and says that she was on labetalol.  She takes clonidine plus Diovan HCTZ.  At this point I think we will just simply leave her medications alone.  Her pressures are well controlled she is on a clonidine that she takes once a day.  Will defer management to PCP as she seems to be pretty well controlled.

## 2021-11-10 NOTE — Patient Instructions (Signed)
Medication Instructions: ° °Your physician recommends that you continue on your current medications as directed. Please refer to the Current Medication list given to you today.  ° °*If you need a refill on your cardiac medications before your next appointment, please call your pharmacy* ° ° °Lab Work: ° °None ordered ° °If you have labs (blood work) drawn today and your tests are completely normal, you will receive your results only by: °MyChart Message (if you have MyChart) OR °A paper copy in the mail °If you have any lab test that is abnormal or we need to change your treatment, we will call you to review the results. ° ° °Testing/Procedures: °None ordered ° ° °Follow-Up: °At [facility], you and your health needs are our priority.  As part of our continuing mission to provide you with exceptional heart care, we have created designated Provider Care Teams.  These Care Teams include your primary Cardiologist (physician) and Advanced Practice Providers (APPs -  Physician Assistants and Nurse Practitioners) who all work together to provide you with the care you need, when you need it. ° °We recommend signing up for the patient portal called "MyChart".  Sign up information is provided on this After Visit Summary.  MyChart is used to connect with patients for Virtual Visits (Telemedicine).  Patients are able to view lab/test results, encounter notes, upcoming appointments, etc.  Non-urgent messages can be sent to your provider as well.   °To learn more about what you can do with MyChart, go to [email or web address].   ° °Your next appointment:   °As needed ° °The format for your next appointment:   °In Person ° °Provider:   °You may see David Harding, MD or one of the following Advanced Practice Providers on your designated Care Team:   °Christopher Berge, NP °Ryan Dunn, PA-C °[name], PA-C1}  ° ° °Other Instructions °N/A °

## 2021-11-14 ENCOUNTER — Telehealth: Payer: Self-pay | Admitting: Internal Medicine

## 2021-11-14 NOTE — Telephone Encounter (Signed)
Copied from CRM [id number]. Topic: Quick Communication - Rx Refill/Question >> Nov 14, 2021  1:00 PM Izora Ribas, [place] A wrote: Medication: cephALEXin (KEFLEX) 500 MG capsule [[id number]]    Has the patient contacted their pharmacy? Yes. (Agent: If no, request that the patient contact the pharmacy for the refill. If patient does not wish to contact the pharmacy document the reason why and proceed with request.) (Agent: If yes, when and what did the pharmacy advise?)  Preferred Pharmacy (with phone number or street name): [id number] Cheree Ditto, [place] - [address] AT [facility] OF [address] [facility] [address] [place] [address] Phone: [phone] Fax: [phone]  Has the patient been seen for an appointment in the last year OR does the patient have an upcoming appointment? Yes.    Agent: Please be advised that RX refills may take up to 3 business days. We ask that you follow-up with your pharmacy.

## 2021-11-15 NOTE — Telephone Encounter (Signed)
Routing to provider to advise.  

## 2021-11-15 NOTE — Telephone Encounter (Signed)
This encounter was created in error - please disregard.

## 2021-11-15 NOTE — Telephone Encounter (Signed)
Patient called back and concerned she has not heard back from the office about the request for abx refill (Keflex). Pt stated  after finishing the Abx her cough returned. Advised pt will notfy the office now

## 2021-11-15 NOTE — Telephone Encounter (Signed)
Patient called on both numbers listed, left VM to return the call to the office to speak to a nurse about her refill request.

## 2021-11-16 NOTE — Telephone Encounter (Signed)
Needs appointment per Dr. Neomia Dear.

## 2021-11-17 ENCOUNTER — Telehealth: Payer: Self-pay

## 2021-11-17 ENCOUNTER — Other Ambulatory Visit: Payer: Self-pay

## 2021-11-17 ENCOUNTER — Encounter: Payer: Self-pay | Admitting: Nurse Practitioner

## 2021-11-17 ENCOUNTER — Ambulatory Visit (INDEPENDENT_AMBULATORY_CARE_PROVIDER_SITE_OTHER): Payer: Medicare Other | Admitting: Nurse Practitioner

## 2021-11-17 VITALS — BP 137/82 | HR 77 | Temp 98.7°F | Ht 65.0 in | Wt 209.0 lb

## 2021-11-17 DIAGNOSIS — R051 Acute cough: Secondary | ICD-10-CM | POA: Diagnosis not present

## 2021-11-17 MED ORDER — CEPHALEXIN 500 MG PO CAPS: 500.0000 mg | ORAL_CAPSULE | Freq: Two times a day (BID) | ORAL | 0 refills | Status: DC

## 2021-11-17 NOTE — Progress Notes (Signed)
BP 137/82    Pulse 77    Temp 98.7 F (37.1 C) (Oral)    Ht 5\' 5"  (1.651 m)    Wt 209 lb (94.8 kg)    SpO2 99%    BMI 34.78 kg/m    Subjective:    Patient ID: Vanessa Hogan, female    DOB: 1941-03-08, 81 y.o.   MRN: [id number]  HPI: Vanessa Hogan is a 81 y.o. female  Chief Complaint  Patient presents with   Medication Refill    Patient is requesting a refill for Keflex, patient states that the ABX was working for her cough but when she finished the ABX the cough returned.    COUGH Symptoms started a couple of days before she was seen in January 6. Duration: days Circumstances of initial development of cough: nothing Cough severity: moderate Cough description:  some clear mucus Aggravating factors:  worse at night Alleviating factors: antibiotics Status:  better Treatments attempted: antibiotics Wheezing: no Shortness of breath: no Chest pain: no Chest tightness:no Nasal congestion: no Runny nose: no Postnasal drip: no Frequent throat clearing or swallowing: no Hemoptysis: no Fevers: no Night sweats: no Weight loss: no Heartburn: no Recent foreign travel: no Tuberculosis contacts: no  [phone]- pneumonia shot  Relevant past medical, surgical, family and social history reviewed and updated as indicated. Interim medical history since our last visit reviewed. Allergies and medications reviewed and updated.  Review of Systems  HENT:  Negative for congestion.   Respiratory:  Positive for cough.    Per HPI unless specifically indicated above     Objective:    BP 137/82    Pulse 77    Temp 98.7 F (37.1 C) (Oral)    Ht 5\' 5"  (1.651 m)    Wt 209 lb (94.8 kg)    SpO2 99%    BMI 34.78 kg/m   Wt Readings from Last 3 Encounters:  11/17/21 209 lb (94.8 kg)  11/10/21 209 lb (94.8 kg)  11/01/21 208 lb 4 oz (94.5 kg)    Physical Exam Vitals and nursing note reviewed.  Constitutional:      General: She is not in acute distress.     Appearance: Normal appearance. She is normal weight. She is not ill-appearing, toxic-appearing or diaphoretic.  HENT:     Head: Normocephalic.     Right Ear: External ear normal.     Left Ear: External ear normal.     Nose: Nose normal.     Mouth/Throat:     Mouth: Mucous membranes are moist.     Pharynx: Oropharynx is clear.  Eyes:     General:        Right eye: No discharge.        Left eye: No discharge.     Extraocular Movements: Extraocular movements intact.     Conjunctiva/sclera: Conjunctivae normal.     Pupils: Pupils are equal, round, and reactive to light.  Cardiovascular:     Rate and Rhythm: Normal rate and regular rhythm.     Heart sounds: No murmur heard. Pulmonary:     Effort: Pulmonary effort is normal. No respiratory distress.     Breath sounds: Normal breath sounds. No wheezing or rales.  Musculoskeletal:     Cervical back: Normal range of motion and neck supple.  Skin:    General: Skin is warm and dry.     Capillary Refill: Capillary refill takes less than 2 seconds.  Neurological:     General:  No focal deficit present.     Mental Status: She is alert and oriented to person, place, and time. Mental status is at baseline.  Psychiatric:        Mood and Affect: Mood normal.        Behavior: Behavior normal.        Thought Content: Thought content normal.        Judgment: Judgment normal.    Results for orders placed or performed in visit on 11/01/21  Fe+TIBC+Fer  Result Value Ref Range   Total Iron Binding Capacity 310 250 - 450 ug/dL   UIBC 262 118 - 369 ug/dL   Iron 48 27 - 139 ug/dL   Iron Saturation 15 15 - 55 %   Ferritin 297 (H) 15 - 150 ng/mL      Assessment & Plan:   Problem List Items Addressed This Visit   None Visit Diagnoses     Acute cough    -  Primary   Will repeat course of Keflex. Symptoms did not improve with cough medicine with codeine and worse with other cough medicines. FU if symptoms do not improve.        Follow up  plan: Return if symptoms worsen or fail to improve.

## 2021-11-17 NOTE — Telephone Encounter (Signed)
-----   Message from Larae Grooms, NP sent at 11/17/2021  2:23 PM EST ----- Patient had pneumonia shots here.  Can we call and request them? [phone]- pneumonia shot

## 2021-11-17 NOTE — Telephone Encounter (Signed)
Spoke with facility where patient mentioned she had a pneumonia vaccine. Patient was given an pneumonia vaccine back on 09/21/2016. Representative verified the vaccine. Patient has only had the one vaccine. Rep verbalized understanding.

## 2021-12-12 ENCOUNTER — Other Ambulatory Visit: Payer: Self-pay

## 2021-12-12 ENCOUNTER — Ambulatory Visit
Admission: RE | Admit: 2021-12-12 | Discharge: 2021-12-12 | Disposition: A | Discharge status: Home / Self Care | Payer: Medicare Other | Source: Ambulatory Visit | Attending: Physician Assistant | Admitting: Physician Assistant

## 2021-12-12 ENCOUNTER — Other Ambulatory Visit: Payer: Self-pay | Admitting: Internal Medicine

## 2021-12-12 DIAGNOSIS — R251 Tremor, unspecified: Secondary | ICD-10-CM | POA: Insufficient documentation

## 2021-12-12 DIAGNOSIS — E119 Type 2 diabetes mellitus without complications: Secondary | ICD-10-CM

## 2021-12-12 DIAGNOSIS — R42 Dizziness and giddiness: Secondary | ICD-10-CM

## 2021-12-12 DIAGNOSIS — K219 Gastro-esophageal reflux disease without esophagitis: Secondary | ICD-10-CM

## 2021-12-12 DIAGNOSIS — R519 Headache, unspecified: Secondary | ICD-10-CM | POA: Insufficient documentation

## 2021-12-12 MED ORDER — GADOBUTROL 1 MMOL/ML IV SOLN
9.0000 mL | Freq: Once | INTRAVENOUS | Status: AC | PRN
  Administered 2021-12-12: 9 mL via INTRAVENOUS

## 2021-12-13 NOTE — Telephone Encounter (Signed)
Requested Prescriptions  Pending Prescriptions Disp Refills   cloNIDine (CATAPRES) 0.2 MG tablet [Pharmacy Med Name: CLONIDINE 0.2MG  TABLETS] 180 tablet 1    Sig: TAKE 1 TABLET(0.2 MG) BY MOUTH TWICE DAILY     Cardiovascular:  Alpha-2 Agonists Passed - 12/12/2021 12:20 PM      Passed - Last BP in normal range    BP Readings from Last 1 Encounters:  11/17/21 137/82         Passed - Last Heart Rate in normal range    Pulse Readings from Last 1 Encounters:  11/17/21 77         Passed - Valid encounter within last 6 months    Recent Outpatient Visits          3 weeks ago Acute cough   [facility] Larae Grooms, NP   1 month ago Diabetes mellitus without complication (HCC)   [facility] Vigg, Avanti, MD   1 month ago Acute cough   [facility] Vigg, Avanti, MD   2 months ago Acute gout of right hand, unspecified cause   [facility] McElwee, Lauren A, NP   2 months ago Left lower quadrant abdominal pain   [facility] McElwee, Lauren A, NP      Future Appointments            In 1 month Vigg, Avanti, MD [facility]   In 1 month Vanga, Loel Dubonnet, MD [facility] [place]

## 2021-12-21 ENCOUNTER — Other Ambulatory Visit: Payer: Self-pay | Admitting: Internal Medicine

## 2021-12-21 DIAGNOSIS — E119 Type 2 diabetes mellitus without complications: Secondary | ICD-10-CM

## 2021-12-21 DIAGNOSIS — K219 Gastro-esophageal reflux disease without esophagitis: Secondary | ICD-10-CM

## 2021-12-21 MED ORDER — VALSARTAN-HYDROCHLOROTHIAZIDE 160-12.5 MG PO TABS: 1.0000 | ORAL_TABLET | Freq: Every day | ORAL | 2 refills | Status: DC

## 2021-12-21 NOTE — Telephone Encounter (Signed)
Requested medication (s) are due for refill today: historical medication ? ?Requested medication (s) are on the active medication list: yes ? ?Last refill:  06/10/21 ? ?Future visit scheduled: yes in 1 month  ? ?Notes to clinic:  historical medication . Do you want to order Rx? ? ? ?  ?Requested Prescriptions  ?Pending Prescriptions Disp Refills  ? valsartan-hydrochlorothiazide (DIOVAN-HCT) 160-12.5 MG tablet    ?  Sig: Take 1 tablet by mouth daily.  ?  ? Cardiovascular: ARB + Diuretic Combos Failed - 12/21/2021 12:33 PM  ?  ?  Failed - K in normal range and within 180 days  ?  Potassium  ?Date Value Ref Range Status  ?08/31/2021 3.4 (L) 3.5 - 5.1 mmol/L Final  ?  ?  ?  ?  Failed - Cr in normal range and within 180 days  ?  Creatinine, Ser  ?Date Value Ref Range Status  ?08/31/2021 1.01 (H) 0.44 - 1.00 mg/dL Final  ?  ?  ?  ?  Passed - Na in normal range and within 180 days  ?  Sodium  ?Date Value Ref Range Status  ?08/31/2021 139 135 - 145 mmol/L Final  ?07/04/2021 143 134 - 144 mmol/L Final  ?  ?  ?  ?  Passed - eGFR is 10 or above and within 180 days  ?  GFR, Estimated  ?Date Value Ref Range Status  ?08/31/2021 56 (L) >60 mL/min Final  ?  Comment:  ?  (NOTE) ?Calculated using the CKD-EPI Creatinine Equation (2021) ?  ? ?eGFR  ?Date Value Ref Range Status  ?07/04/2021 63 >59 mL/min/1.73 Final  ?  ?  ?  ?  Passed - Patient is not pregnant  ?  ?  Passed - Last BP in normal range  ?  BP Readings from Last 1 Encounters:  ?11/17/21 137/82  ?  ?  ?  ?  Passed - Valid encounter within last 6 months  ?  Recent Outpatient Visits   ? ?      ? 1 month ago Acute cough  ? [place], NP  ? 1 month ago Diabetes mellitus without complication ([place])  ? [facility] Vigg, Avanti, MD  ? 2 months ago Acute cough  ? [facility] Vigg, Avanti, MD  ? 2 months ago Acute gout of right hand, unspecified cause  ? [place], Lauren A, NP  ? 2 months ago Left lower  quadrant abdominal pain  ? [facility], Lauren A, NP  ? ?  ?  ?Future Appointments   ? ?        ? In 1 month Vigg, Avanti, MD [facility]  ? In 1 month Vanga, Tally Due, MD [place]  ? ?  ? ?  ?  ?  ? ?

## 2021-12-21 NOTE — Telephone Encounter (Signed)
Copied from CRM [id number]. Topic: Quick Communication - Rx Refill/Question ?>> Dec 21, 2021  9:35 AM Marylen Ponto wrote: ?Medication: valsartan-hydrochlorothiazide (DIOVAN-HCT) 160-12.5 MG tablet ? ?Has the patient contacted their pharmacy? Yes.  Pt request Rx be sent to new pharamacy ?(Agent: If no, request that the patient contact the pharmacy for the refill. If patient does not wish to contact the pharmacy document the reason why and proceed with request.) ?(Agent: If yes, when and what did the pharmacy advise?) ? ?Preferred Pharmacy (with phone number or street name): [id number] - [place],  - [address] AT [facility] OF [address]  ?Phone: [phone] ?Fax: [phone] ? ?Has the patient been seen for an appointment in the last year OR does the patient have an upcoming appointment? Yes.   ? ?Agent: Please be advised that RX refills may take up to 3 business days. We ask that you follow-up with your pharmacy. ?

## 2021-12-22 ENCOUNTER — Ambulatory Visit: Payer: Medicare Other | Admitting: Obstetrics and Gynecology

## 2021-12-22 ENCOUNTER — Other Ambulatory Visit: Payer: Self-pay

## 2021-12-22 VITALS — BP 153/77 | HR 97 | Resp 16 | Ht 65.5 in | Wt 204.3 lb

## 2021-12-22 DIAGNOSIS — N840 Polyp of corpus uteri: Secondary | ICD-10-CM

## 2021-12-27 ENCOUNTER — Telehealth: Payer: Self-pay | Admitting: Internal Medicine

## 2021-12-27 ENCOUNTER — Other Ambulatory Visit: Payer: Self-pay

## 2021-12-27 DIAGNOSIS — K219 Gastro-esophageal reflux disease without esophagitis: Secondary | ICD-10-CM

## 2021-12-27 DIAGNOSIS — E119 Type 2 diabetes mellitus without complications: Secondary | ICD-10-CM

## 2021-12-27 MED ORDER — GLOBAL EASE INJECT PEN NEEDLES 32G X 4 MM MISC: 11 refills | Status: DC

## 2021-12-27 NOTE — Telephone Encounter (Signed)
Medication Refill - Medication: pen needles  32 g  46mm ? ?Has the patient contacted their pharmacy? Yes.   ?Pt went to buy pen needles and the pharmacist told her if the dr will order, her insurance will pay for them. ?Pt would like to know if Dr Neomia Dear will order for her? ? ?Preferred Pharmacy (with phone number or street name): [place] [place] ? ?Has the patient been seen for an appointment in the last year OR does the patient have an upcoming appointment? Yes.   ? ?Agent: Please be advised that RX refills may take up to 3 business days. We ask that you follow-up with your pharmacy. ? ?

## 2021-12-28 ENCOUNTER — Other Ambulatory Visit: Payer: Self-pay

## 2021-12-28 ENCOUNTER — Other Ambulatory Visit: Payer: Medicare Other

## 2021-12-28 DIAGNOSIS — E119 Type 2 diabetes mellitus without complications: Secondary | ICD-10-CM

## 2021-12-28 DIAGNOSIS — M109 Gout, unspecified: Secondary | ICD-10-CM

## 2021-12-28 NOTE — Telephone Encounter (Signed)
Patient was notified that her prescription was sent over to her local pharmacy. Patient verbalized understanding and has no further questions.  ?

## 2021-12-29 LAB — LIPID PANEL
Chol/HDL Ratio: 5.4 ratio — ABNORMAL HIGH (ref 0.0–4.4)
Cholesterol, Total: 204 mg/dL — ABNORMAL HIGH (ref 100–199)
HDL: 38 mg/dL — ABNORMAL LOW (ref >39)
LDL Chol Calc (NIH): 137 mg/dL — ABNORMAL HIGH (ref 0–99)
Triglycerides: 161 mg/dL — ABNORMAL HIGH (ref 0–149)
VLDL Cholesterol Cal: 29 mg/dL (ref 5–40)

## 2021-12-29 LAB — URIC ACID: Uric Acid: 6.2 mg/dL (ref 3.1–7.9)

## 2022-01-12 ENCOUNTER — Ambulatory Visit: Payer: Medicare Other | Admitting: Obstetrics and Gynecology

## 2022-01-12 ENCOUNTER — Other Ambulatory Visit: Payer: Self-pay

## 2022-01-12 ENCOUNTER — Encounter: Payer: Self-pay | Admitting: Obstetrics and Gynecology

## 2022-01-12 VITALS — BP 107/73 | HR 73 | Ht 65.5 in | Wt 205.6 lb

## 2022-01-12 DIAGNOSIS — N840 Polyp of corpus uteri: Secondary | ICD-10-CM

## 2022-01-12 DIAGNOSIS — Z7689 Persons encountering health services in other specified circumstances: Secondary | ICD-10-CM | POA: Diagnosis not present

## 2022-01-12 NOTE — Progress Notes (Signed)
HPI: ?     [name] is a 81 y.o. [id number] who LMP was No LMP recorded. Patient is postmenopausal. ? ?Subjective:  ? ?She presents today because she was in a work-up for intermittent lower abdominal pain and had an ultrasound.  The ultrasound showed minimal endometrial thickness (3 mm) but a possible small endometrial polyp.  Patient has had no postmenopausal bleeding.  She has never undergone any chemotherapy for breast cancer or otherwise.  She reports that the pelvic pain that initiated the work-up is intermittent and seems to be improving over time. ?Her chart is wrong and that it says that she has had a hysterectomy-she obviously has not. ? ?  Hx: ?The following portions of the patient's history were reviewed and updated as appropriate: ?            She  has a past medical history of Anxiety, Depression, Diabetes mellitus without complication (HCC), GERD (gastroesophageal reflux disease), Gout, and Hypertension. ?She does not have any pertinent problems on file. ?She  has a past surgical history that includes Breast surgery; Hernia repair; Abdominal hysterectomy; and Tubal ligation. ?Her family history includes Dementia in her sister; Diabetes in her daughter; Hypertension in her brother and son; Stroke in her father and mother. ?She  reports that she quit smoking about 51 years ago. Her smoking use included cigarettes. She has a 1.25 pack-year smoking history. She has quit using smokeless tobacco. She reports current alcohol use of about 1.0 standard drink per week. She reports that she does not use drugs. ?She has a current medication list which includes the following prescription(s): accu-chek softclix lancets, allopurinol, clonidine, global ease inject pen needles, humalog mix 75/25 kwikpen, lorazepam, metformin, omeprazole, omeprazole, ozempic (1 mg/dose), and valsartan-hydrochlorothiazide. ?She is allergic to asa [aspirin], morphine and related, nitroglycerin, penicillins, and  sertraline. ?      ?Review of Systems:  ?Review of Systems ? ?Constitutional: Denied constitutional symptoms, night sweats, recent illness, fatigue, fever, insomnia and weight loss.  ?Eyes: Denied eye symptoms, eye pain, photophobia, vision change and visual disturbance.  ?Ears/Nose/Throat/Neck: Denied ear, nose, throat or neck symptoms, hearing loss, nasal discharge, sinus congestion and sore throat.  ?Cardiovascular: Denied cardiovascular symptoms, arrhythmia, chest pain/pressure, edema, exercise intolerance, orthopnea and palpitations.  ?Respiratory: Denied pulmonary symptoms, asthma, pleuritic pain, productive sputum, cough, dyspnea and wheezing.  ?Gastrointestinal: Denied, gastro-esophageal reflux, melena, nausea and vomiting.  ?Genitourinary: Denied genitourinary symptoms including symptomatic vaginal discharge, pelvic relaxation issues, and urinary complaints.  ?Musculoskeletal: Denied musculoskeletal symptoms, stiffness, swelling, muscle weakness and myalgia.  ?Dermatologic: Denied dermatology symptoms, rash and scar.  ?Neurologic: Denied neurology symptoms, dizziness, headache, neck pain and syncope.  ?Psychiatric: Denied psychiatric symptoms, anxiety and depression.  ?Endocrine: Denied endocrine symptoms including hot flashes and night sweats.  ? ?Meds: ?  ?Current Outpatient Medications on File Prior to Visit  ?Medication Sig Dispense Refill  ? Accu-Chek Softclix Lancets lancets SMARTSIG:Topical    ? allopurinol (ZYLOPRIM) 100 MG tablet Take 1 tablet (100 mg total) by mouth daily. 90 tablet 1  ? cloNIDine (CATAPRES) 0.2 MG tablet TAKE 1 TABLET(0.2 MG) BY MOUTH TWICE DAILY 180 tablet 1  ? GLOBAL EASE INJECT PEN NEEDLES 32G X 4 MM MISC SMARTSIG:1 needle SUB-Q 3 Times Daily 100 each 11  ? HUMALOG MIX 75/25 KWIKPEN (75-25) 100 UNIT/ML KwikPen Inject into the skin. 18 units in the AM, 20 units in the PM    ? LORazepam (ATIVAN) 0.5 MG tablet Take 1 tablet (0.5 mg total)  by mouth as needed for anxiety. 20 tablet  0  ? metFORMIN (GLUCOPHAGE) 500 MG tablet Take 1 tablet (500 mg total) by mouth 2 (two) times daily with a meal. 60 tablet 3  ? omeprazole (PRILOSEC) 20 MG capsule Take 20 mg by mouth daily.    ? omeprazole (PRILOSEC) 40 MG capsule Take 1 capsule (40 mg total) by mouth daily. 30 capsule 3  ? Semaglutide, 1 MG/DOSE, (OZEMPIC, 1 MG/DOSE,) 2 MG/1.5ML SOPN Inject 1 mg into the skin once a week. 1.5 mL 3  ? valsartan-hydrochlorothiazide (DIOVAN-HCT) 160-12.5 MG tablet Take 1 tablet by mouth daily. 30 tablet 2  ? ?No current facility-administered medications on file prior to visit.  ? ? ? ? ?Objective:  ?  ? ?There were no vitals filed for this visit. ?Filed Weights  ? 01/12/22 1131  ?Weight: 205 lb 9.6 oz (93.3 kg)  ? ?  ?         Ultrasound results reviewed in detail -and discussed with patient ?        ? ?Assessment:  ?  ?[id number] ?Patient Active Problem List  ? Diagnosis Date Noted  ? Acute gout of right hand 10/04/2021  ? Left lower quadrant abdominal pain 09/30/2021  ? Type 2 diabetes mellitus with hyperglycemia, with long-term current use of insulin (HCC) 09/13/2021  ? Melena 09/07/2021  ? Dizziness 09/07/2021  ? Essential hypertension 08/25/2021  ? Foot pain, left 07/22/2021  ? Diabetes mellitus without complication (HCC) 07/21/2021  ? Gastroesophageal reflux disease 07/21/2021  ? Intermittent right-sided chest pain 2010  ? ?  ?1. Establishing care with new doctor, encounter for   ?2. Endometrial polyp   ? ? Without any bleeding and with a nonspecific "polyp" I believe her risk of malignancy is very low.  In addition her endometrium is 3 mm.  Endometrial polyps do not cause pelvic pain and so her initial reason for obtaining the ultrasound is not consistent with this possible small polyp. ? ? ?Plan:  ?  ?       ? 1.  We have discussed the possibility of endometrial biopsy.  The risks and benefits were reviewed with her.  The very small chance of this polyp being malignant were discussed.  We have also discussed the  possible management of follow-up ultrasound to see if any further growth occurs with the "polyp".  She has declined this option at this time.  She will inform us if she changes her mind regarding endometrial biopsy or follow-up ultrasound.  She has also been instructed to inform us immediately if she has vaginal bleeding because at that point further work-up including endometrial biopsy will likely be necessary. ?At this time she remains unsure whether or not she will pursue a further work-up for her vague intermittent abdominal pain. ?Orders ?No orders of the defined types were placed in this encounter. ? ? No orders of the defined types were placed in this encounter. ?  ?  F/U ? No follow-ups on file. ?I spent 31 minutes involved in the care of this patient preparing to see the patient by obtaining and reviewing her medical history (including labs, imaging tests and prior procedures), documenting clinical information in the electronic health record (EHR), counseling and coordinating care plans, writing and sending prescriptions, ordering tests or procedures and in direct communicating with the patient and medical staff discussing pertinent items from her history and physical exam. ? ?Elonda Huskyavid J. Janson Lamar, M.D. ?01/12/2022 ?12:06 PM ? ? ? ? ?

## 2022-01-12 NOTE — Progress Notes (Signed)
Patient presents today to discuss recent ultrasound. She states for years she will have dull pelvic pain on and off, skipping a few months at a time. Patient originally believed this was due to acid reflux. She states no bleeding or current pain. She states she was never given ultrasound results. Patient states no other questions or concerns.  ?

## 2022-01-19 ENCOUNTER — Other Ambulatory Visit: Payer: Self-pay | Admitting: Internal Medicine

## 2022-01-20 NOTE — Telephone Encounter (Signed)
Requested Prescriptions  ?Pending Prescriptions Disp Refills  ?? omeprazole (PRILOSEC) 20 MG capsule [Pharmacy Med Name: OMEPRAZOLE 20MG  CAPSULES] 90 capsule 2  ?  Sig: TAKE 1 CAPSULE BY MOUTH ONCE A DAY FOR REFLUX  ?  ? Gastroenterology: Proton Pump Inhibitors Passed - 01/19/2022 10:41 AM  ?  ?  Passed - Valid encounter within last 12 months  ?  Recent Outpatient Visits   ?      ? 2 months ago Acute cough  ? [facility], NP  ? 2 months ago Diabetes mellitus without complication (HCC)  ? [facility] Vigg, Avanti, MD  ? 3 months ago Acute cough  ? [facility] Vigg, Avanti, MD  ? 3 months ago Acute gout of right hand, unspecified cause  ? [facility] McElwee, Lauren A, NP  ? 3 months ago Left lower quadrant abdominal pain  ? [facility]  ?  ?  ?Future Appointments   ?        ? In 6 days Vigg, Avanti, MD [facility]  ? In 1 week Vanga, [facility], MD [facility] [place]  ?  ? ?  ?  ?  ? ?

## 2022-01-26 ENCOUNTER — Ambulatory Visit: Payer: Medicare Other | Admitting: Internal Medicine

## 2022-01-30 ENCOUNTER — Encounter: Payer: Self-pay | Admitting: Gastroenterology

## 2022-01-30 ENCOUNTER — Other Ambulatory Visit: Payer: Self-pay

## 2022-01-30 ENCOUNTER — Ambulatory Visit: Payer: Medicare Other | Admitting: Gastroenterology

## 2022-01-30 VITALS — BP 163/89 | HR 88 | Temp 98.2°F | Wt 208.2 lb

## 2022-01-30 DIAGNOSIS — R1032 Left lower quadrant pain: Secondary | ICD-10-CM

## 2022-01-30 DIAGNOSIS — R140 Abdominal distension (gaseous): Secondary | ICD-10-CM

## 2022-01-30 DIAGNOSIS — R1031 Right lower quadrant pain: Secondary | ICD-10-CM | POA: Diagnosis not present

## 2022-01-30 NOTE — Progress Notes (Signed)
?  ?Vanessa Repressohini R Slyvia Lartigue, MD ?[address]  ?[address]  ?[place], [place] [address]  ?Main: [phone]  ?Fax: [phone] ? ? ? ?Gastroenterology Consultation ? ?Referring Provider:     Loura PardonVigg, Avanti, MD ?Primary Care Physician:  Vanessa PardonVigg, Avanti, MD ?Primary Gastroenterologist:  Dr. Arlyss Repressohini R Natalie Hogan ?Reason for Consultation:     Lower abdominal discomfort ?      ? HPI:   ?Vanessa CoganDolores Mary Hogan is a 81 y.o. female referred by Dr. Loura PardonVigg, Avanti, MD  for consultation & management of lower abdominal discomfort.  Patient reports that she has been taking omeprazole 20 mg daily for at least 10 years for reflux.  She reports that about 2 weeks ago, she experienced lower abdominal discomfort which was vague and not associated with any aggravating or relieving factors.  She reports having regular bowel movements, 1-2 times daily, denies any rectal bleeding or abdominal bloating.  She was told that it might be indigestion and therefore her omeprazole was increased to 40 mg daily for last 2 weeks.  She currently does not have lower abdominal discomfort.  Patient denies any other GI symptoms.  Her labs reveal chronic microcytosis without evidence of anemia.  She reports having undergoing colonoscopy in [place] about 5 years ago.  She was told that she had polyps that were removed.  She does not know when she is due for next colonoscopy. 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 ? ?Follow-up visit 01/30/22 ?Patient is here for follow-up of lower abdominal discomfort.  Patient noticed that she would experience severe lower abdominal bloating and discomfort after she [facility].  Therefore, she started taking Pepcid and she is no longer experiencing GI symptoms.  She was taking omeprazole 20 mg capsules from her original pharmacy without any  lower GI issues.  She was also evaluated by OB/GYN for endometrial polyp and she deferred biopsy at this time ? ?NSAIDs: None ? ?Antiplts/Anticoagulants/Anti thrombotics: None ? ?GI Procedures: Reports undergoing colonoscopy about 5 years ago in [place], found to have polyps, report not available ? ?Past Medical History:  ?Diagnosis Date  ? Anxiety   ? Depression   ? Diabetes mellitus without complication (HCC)   ? GERD (gastroesophageal reflux disease)   ? Gout   ? Hypertension   ? ? ?Past Surgical History:  ?Procedure Laterality Date  ? ABDOMINAL HYSTERECTOMY    ? BREAST SURGERY    ? HERNIA REPAIR    ? TUBAL LIGATION    ? ?Current Outpatient Medications:  ?  cloNIDine (CATAPRES) 0.2 MG tablet, Take 1 tablet by mouth 2 (two) times daily., Disp: , Rfl:  ?  insulin aspart protamine - aspart (NOVOLOG MIX 70/30 FLEXPEN) (70-30) 100 UNIT/ML FlexPen, Inject into the skin., Disp: , Rfl:  ?  loratadine (CLARITIN) 10 MG tablet, Take by mouth., Disp: , Rfl:  ?  LORazepam (ATIVAN) 0.5 MG tablet, Take 1 tablet (0.5 mg total) by mouth as needed for anxiety., Disp: 20 tablet, Rfl: 0 ?  metFORMIN (GLUCOPHAGE-XR) 500 MG 24 hr tablet, Take by mouth., Disp: , Rfl:  ?  omeprazole (PRILOSEC) 20 MG capsule, TAKE 1 CAPSULE BY MOUTH ONCE A DAY FOR REFLUX, Disp: 90 capsule, Rfl: 2 ?  omeprazole (PRILOSEC) 40 MG capsule, Take 1 capsule (40 mg total) by mouth daily., Disp: 30 capsule, Rfl: 3 ?  valsartan-hydrochlorothiazide (DIOVAN-HCT) 160-12.5 MG tablet, Take 1 tablet by mouth daily., Disp: 30 tablet, Rfl: 2 ?  Accu-Chek Softclix Lancets lancets, SMARTSIG:Topical, Disp: , Rfl:  ?  allopurinol (ZYLOPRIM) 100 MG tablet, Take 1 tablet (100 mg total) by mouth daily., Disp: 90 tablet, Rfl: 1 ?  GLOBAL EASE INJECT PEN NEEDLES 32G X 4 MM MISC, SMARTSIG:1 needle SUB-Q 3 Times Daily, Disp: 100 each, Rfl: 11 ?  HUMALOG MIX 75/25 KWIKPEN (75-25) 100 UNIT/ML KwikPen, Inject into the skin. 18 units in the AM, 20 units in the PM, Disp: , Rfl:   ? ? ? ?Family History  ?Problem Relation Age of Onset  ? Stroke Mother   ? Stroke Father   ? Dementia Sister   ? Hypertension Brother   ? Diabetes Daughter   ? Hypertension Son   ?  ? ?Social History  ? ?Tobacco Use  ? Smoking status: Former  ?  Packs/day: 0.25  ?  Years: 5.00  ?  Pack years: 1.25  ?  Types: Cigarettes  ?  Quit date: 84  ?  Years since quitting: 51.3  ? Smokeless tobacco: Former  ?Vaping Use  ? Vaping Use: Never used  ?Substance Use Topics  ? Alcohol use: Yes  ?  Alcohol/week: 1.0 standard drink  ?  Types: 1 Glasses of wine per week  ?  Comment: occasionally  ? Drug use: Never  ? ? ?Allergies as of 01/30/2022 - Review Complete 01/30/2022  ?Allergen Reaction Noted  ? Asa [aspirin]  07/04/2021  ? Morphine and related  07/04/2021  ? Nitroglycerin Other (See Comments) 03/05/2020  ? Penicillins  07/04/2021  ? Sertraline Other (See Comments) 05/19/2020  ? ? ?Review of Systems:    ?All systems reviewed and negative except where noted in HPI. ? ? Physical Exam:  ?BP (!) 163/89 (BP Location: Left Arm, Patient Position: Supine, Cuff Size: Large)   Pulse 88   Temp 98.2 ?F (36.8 ?C) (Oral)   Wt 208 lb 4 oz (94.5 kg)   BMI 34.13 kg/m?  ?No LMP recorded. Patient is postmenopausal. ? ?General:   Alert,  Well-developed, well-nourished, pleasant and cooperative in NAD ?Head:  Normocephalic and atraumatic. ?Eyes:  Sclera clear, no icterus.   Conjunctiva pink. ?Ears:  Normal auditory acuity. ?Nose:  No deformity, discharge, or lesions. ?Mouth:  No deformity or lesions,oropharynx pink & moist. ?Neck:  Supple; no masses or thyromegaly. ?Lungs:  Respirations even and unlabored.  Clear throughout to auscultation.   No wheezes, crackles, or rhonchi. No acute distress. ?Heart:  Regular rate and rhythm; no murmurs, clicks, rubs, or gallops. ?Abdomen:  Normal bowel sounds. Soft, non-tender and non-distended without masses, hepatosplenomegaly or hernias noted.  No guarding or rebound tenderness.   ?Rectal: Not  performed ?Msk:  Symmetrical without gross deformities. Good, equal movement & strength bilaterally. ?Pulses:  Normal pulses noted. ?Extremities:  No clubbing or edema.  No cyanosis. ?Neurologic:  Alert and oriented x3;  grossly normal neurologically. ?Skin:  Intact without significant lesions or rashes. No jaundice. ?Lymph Nodes:  No significant cervical adenopathy. ?Psych:  Alert and cooperative. Normal mood and affect. ? ?Imaging Studies: ?Reviewed ? ?Assessment and Plan:  ? ?Vanessa Hogan is a 81 y.o. female with metabolic syndrome is seen in consultation for an isolated episode of lower abdominal discomfort without any other constitutional symptoms or aggravating or relieving factors.  No other associated GI symptoms.  Patient recently experienced lower abdominal bloating and discomfort by taking one particular brand of omeprazole  from [employer].  Likely her symptoms are related to the capsule shell as her symptoms did not recur by switching to Pepcid.  Continue Pepcid for now.  No evidence of iron deficiency.  I do not recommend further work-up at this time ? ?Follow up as needed ? ? ?Vanessa Repress, MD ? ?

## 2022-02-06 ENCOUNTER — Ambulatory Visit (INDEPENDENT_AMBULATORY_CARE_PROVIDER_SITE_OTHER): Payer: Medicare Other | Admitting: Internal Medicine

## 2022-02-06 ENCOUNTER — Encounter: Payer: Self-pay | Admitting: Internal Medicine

## 2022-02-06 VITALS — BP 133/75 | HR 78 | Temp 98.9°F | Ht 65.51 in | Wt 208.0 lb

## 2022-02-06 DIAGNOSIS — R109 Unspecified abdominal pain: Secondary | ICD-10-CM | POA: Diagnosis not present

## 2022-02-06 DIAGNOSIS — E1165 Type 2 diabetes mellitus with hyperglycemia: Secondary | ICD-10-CM | POA: Diagnosis not present

## 2022-02-06 DIAGNOSIS — Z794 Long term (current) use of insulin: Secondary | ICD-10-CM | POA: Diagnosis not present

## 2022-02-06 DIAGNOSIS — R1013 Epigastric pain: Secondary | ICD-10-CM | POA: Insufficient documentation

## 2022-02-06 DIAGNOSIS — E119 Type 2 diabetes mellitus without complications: Secondary | ICD-10-CM

## 2022-02-06 MED ORDER — ROSUVASTATIN CALCIUM 10 MG PO TABS
10.0000 mg | ORAL_TABLET | Freq: Every day | ORAL | 3 refills | Status: DC
Start: 2022-02-06 — End: 2023-05-18

## 2022-02-06 NOTE — Progress Notes (Signed)
? ?BP 133/75   Pulse 78   Temp 98.9 ?F (37.2 ?C) (Oral)   Ht 5' 5.51" (1.664 m)   Wt 208 lb (94.3 kg)   SpO2 98%   BMI 34.07 kg/m?   ? ?Subjective:  ? ? Patient ID: Vanessa Hogan, female    DOB: 1941-02-05, 81 y.o.   MRN: [id number] ? ?Chief Complaint  ?Patient presents with  ? Diabetes  ? Hypertension  ? Gastroesophageal Reflux  ? Knee Pain  ?  Right knee gives out ?  ? ? ?HPI: ?Vanessa CoganDolores Mary Baines is a 81 y.o. female ? ?Diabetes ?She presents for her follow-up diabetic visit. She has type 2 diabetes mellitus. Pertinent negatives for hypoglycemia include no headaches. Pertinent negatives for diabetes include no blurred vision, no chest pain, no fatigue, no foot paresthesias, no foot ulcerations, no polydipsia, no polyphagia, no visual change, no weakness and no weight loss.  ?Hypertension ?This is a chronic problem. Pertinent negatives include no blurred vision, chest pain or headaches.  ?Gastroesophageal Reflux ?She complains of abdominal pain. She reports no belching, no chest pain or no nausea. Pertinent negatives include no fatigue, melena or weight loss.  ?Knee Pain  ? ?Abdominal Pain ?This is a chronic problem. The pain is located in the suprapubic region (1 hour after dinner usually). The quality of the pain is colicky. The abdominal pain does not radiate. Associated symptoms include flatus. Pertinent negatives include no anorexia, arthralgias, belching, constipation, diarrhea, dysuria, fever, frequency, headaches, hematochezia, hematuria, melena, myalgias, nausea, vomiting or weight loss. Her past medical history is significant for GERD.  ? ?Chief Complaint  ?Patient presents with  ? Diabetes  ? Hypertension  ? Gastroesophageal Reflux  ? Knee Pain  ?  Right knee gives out ?  ? ? ?Relevant past medical, surgical, family and social history reviewed and updated as indicated. Interim medical history since our last visit reviewed. ?Allergies and medications reviewed and updated. ? ?Review of  Systems  ?Constitutional:  Negative for fatigue, fever and weight loss.  ?Eyes:  Negative for blurred vision.  ?Cardiovascular:  Negative for chest pain.  ?Gastrointestinal:  Positive for abdominal pain and flatus. Negative for anorexia, constipation, diarrhea, hematochezia, melena, nausea and vomiting.  ?Endocrine: Negative for polydipsia and polyphagia.  ?Genitourinary:  Negative for dysuria, frequency and hematuria.  ?Musculoskeletal:  Negative for arthralgias and myalgias.  ?Neurological:  Negative for weakness and headaches.  ? ?Per HPI unless specifically indicated above ? ?   ?Objective:  ?  ?BP 133/75   Pulse 78   Temp 98.9 ?F (37.2 ?C) (Oral)   Ht 5' 5.51" (1.664 m)   Wt 208 lb (94.3 kg)   SpO2 98%   BMI 34.07 kg/m?   ?Wt Readings from Last 3 Encounters:  ?02/06/22 208 lb (94.3 kg)  ?01/30/22 208 lb 4 oz (94.5 kg)  ?01/12/22 205 lb 9.6 oz (93.3 kg)  ?  ?Physical Exam ?Vitals and nursing note reviewed.  ?Constitutional:   ?   General: She is not in acute distress. ?   Appearance: Normal appearance. She is not ill-appearing or diaphoretic.  ?Eyes:  ?   Conjunctiva/sclera: Conjunctivae normal.  ?Pulmonary:  ?   Breath sounds: No rhonchi.  ?Abdominal:  ?   General: Abdomen is flat. Bowel sounds are normal. There is no distension.  ?   Palpations: Abdomen is soft. There is no mass.  ?   Tenderness: There is no abdominal tenderness. There is no guarding.  ?Skin: ?  General: Skin is warm and dry.  ?   Coloration: Skin is not jaundiced.  ?   Findings: No erythema.  ?Neurological:  ?   Mental Status: She is alert.  ? ? ?Results for orders placed or performed in visit on 12/28/21  ?Lipid panel  ?Result Value Ref Range  ? Cholesterol, Total 204 (H) 100 - 199 mg/dL  ? Triglycerides 161 (H) 0 - 149 mg/dL  ? HDL 38 (L) >39 mg/dL  ? VLDL Cholesterol Cal 29 5 - 40 mg/dL  ? LDL Chol Calc (NIH) 137 (H) 0 - 99 mg/dL  ? Chol/HDL Ratio 5.4 (H) 0.0 - 4.4 ratio  ?Uric acid  ?Result Value Ref Range  ? Uric Acid 6.2 3.1 -  7.9 mg/dL  ? ?   ? ? ?Current Outpatient Medications:  ?  Accu-Chek Softclix Lancets lancets, SMARTSIG:Topical, Disp: , Rfl:  ?  allopurinol (ZYLOPRIM) 100 MG tablet, Take 1 tablet (100 mg total) by mouth daily., Disp: 90 tablet, Rfl: 1 ?  cloNIDine (CATAPRES) 0.2 MG tablet, Take 1 tablet by mouth 2 (two) times daily., Disp: , Rfl:  ?  dicyclomine (BENTYL) 20 MG tablet, Take 20 mg by mouth once., Disp: , Rfl:  ?  GLOBAL EASE INJECT PEN NEEDLES 32G X 4 MM MISC, SMARTSIG:1 needle SUB-Q 3 Times Daily, Disp: 100 each, Rfl: 11 ?  HUMALOG MIX 75/25 KWIKPEN (75-25) 100 UNIT/ML KwikPen, Inject into the skin. 18 units in the AM, 20 units in the PM, Disp: , Rfl:  ?  ibuprofen (ADVIL) 200 MG tablet, Take 200 mg by mouth every 6 (six) hours as needed., Disp: , Rfl:  ?  loratadine (CLARITIN) 10 MG tablet, Take by mouth., Disp: , Rfl:  ?  LORazepam (ATIVAN) 0.5 MG tablet, Take 1 tablet (0.5 mg total) by mouth as needed for anxiety., Disp: 20 tablet, Rfl: 0 ?  metFORMIN (GLUCOPHAGE-XR) 500 MG 24 hr tablet, Take by mouth., Disp: , Rfl:  ?  omeprazole (PRILOSEC) 20 MG capsule, TAKE 1 CAPSULE BY MOUTH ONCE A DAY FOR REFLUX, Disp: 90 capsule, Rfl: 2 ?  omeprazole (PRILOSEC) 40 MG capsule, Take 1 capsule (40 mg total) by mouth daily., Disp: 30 capsule, Rfl: 3 ?  rosuvastatin (CRESTOR) 10 MG tablet, Take 1 tablet (10 mg total) by mouth daily., Disp: 90 tablet, Rfl: 3 ?  valsartan-hydrochlorothiazide (DIOVAN-HCT) 160-12.5 MG tablet, Take 1 tablet by mouth daily., Disp: 30 tablet, Rfl: 2  ? ? ?Assessment & Plan:  ?DM : is on ,metformin 500 xr as well as humalog 75/25 18 units and 20 units. ?check HbA1c,  urine  microalbumin  diabetic diet plan given to pt  adviced regarding hypoglycemia and instructions given to pt today on how to prevent and treat the same if it were to occur. pt acknowledges the plan and voices understanding of the same.  exercise plan given and encouraged.   advice diabetic yearly podiatry, ophthalmology , nutritionist  , dental check q 6 months. ? ?2. HLD is on crestor for such  ?recheck FLP, check LFT's work on diet, SE of meds explained to pt. low fat and high fiber diet explained to pt.; ? ?3. HTN :  ?Continue current meds.  Medication compliance emphasised. pt advised to keep Bp logs. Pt verbalised understanding of the same. Pt to have a low salt diet . Exercise to reach a goal of at least 150 mins a week.  lifestyle modifications explained and pt understands importance of the above. ?Under good control  on current regimen. Continue current regimen. Continue to monitor. Call with any concerns. Refills given. Labs drawn today. ? ?Problem List Items Addressed This Visit   ? ?  ? Endocrine  ? Diabetes mellitus without complication (HCC) - Primary (Chronic)  ? Relevant Medications  ? rosuvastatin (CRESTOR) 10 MG tablet  ? Other Relevant Orders  ? Bayer DCA Hb A1c Waived (STAT)  ? Comprehensive metabolic panel  ? CBC with Differential/Platelet  ? Type 2 diabetes mellitus with hyperglycemia, with long-term current use of insulin (HCC) (Chronic)  ? Relevant Medications  ? rosuvastatin (CRESTOR) 10 MG tablet  ?  ? Other  ? Abdominal pain  ?  ? ?Orders Placed This Encounter  ?Procedures  ? Bayer DCA Hb A1c Waived (STAT)  ? Comprehensive metabolic panel  ? CBC with Differential/Platelet  ?  ? ?Meds ordered this encounter  ?Medications  ? rosuvastatin (CRESTOR) 10 MG tablet  ?  Sig: Take 1 tablet (10 mg total) by mouth daily.  ?  Dispense:  90 tablet  ?  Refill:  3  ?  ? ? ?

## 2022-02-07 ENCOUNTER — Other Ambulatory Visit: Payer: Medicare Other

## 2022-02-10 ENCOUNTER — Other Ambulatory Visit: Payer: Medicare Other

## 2022-02-10 DIAGNOSIS — K219 Gastro-esophageal reflux disease without esophagitis: Secondary | ICD-10-CM

## 2022-02-10 DIAGNOSIS — E119 Type 2 diabetes mellitus without complications: Secondary | ICD-10-CM

## 2022-02-10 LAB — BAYER DCA HB A1C WAIVED: HB A1C (BAYER DCA - WAIVED): 6 % — ABNORMAL HIGH (ref 4.8–5.6)

## 2022-02-11 LAB — COMPREHENSIVE METABOLIC PANEL
ALT: 17 IU/L (ref 0–32)
AST: 22 IU/L (ref 0–40)
Albumin/Globulin Ratio: 1.4 (ref 1.2–2.2)
Albumin: 3.9 g/dL (ref 3.7–4.7)
Alkaline Phosphatase: 68 IU/L (ref 44–121)
BUN/Creatinine Ratio: 12 (ref 12–28)
BUN: 11 mg/dL (ref 8–27)
Bilirubin Total: 0.3 mg/dL (ref 0.0–1.2)
CO2: 25 mmol/L (ref 20–29)
Calcium: 9.4 mg/dL (ref 8.7–10.3)
Chloride: 102 mmol/L (ref 96–106)
Creatinine, Ser: 0.92 mg/dL (ref 0.57–1.00)
Globulin, Total: 2.8 g/dL (ref 1.5–4.5)
Glucose: 108 mg/dL — ABNORMAL HIGH (ref 70–99)
Potassium: 3.7 mmol/L (ref 3.5–5.2)
Sodium: 143 mmol/L (ref 134–144)
Total Protein: 6.7 g/dL (ref 6.0–8.5)
eGFR: 63 mL/min/{1.73_m2} (ref >59)

## 2022-02-11 LAB — CBC WITH DIFFERENTIAL/PLATELET
Basophils Absolute: 0 10*3/uL (ref 0.0–0.2)
Basos: 1 %
EOS (ABSOLUTE): 0.1 10*3/uL (ref 0.0–0.4)
Eos: 3 %
Hematocrit: 47.1 % — ABNORMAL HIGH (ref 34.0–46.6)
Hemoglobin: 14.3 g/dL (ref 11.1–15.9)
Immature Grans (Abs): 0 10*3/uL (ref 0.0–0.1)
Immature Granulocytes: 0 %
Lymphocytes Absolute: 2 10*3/uL (ref 0.7–3.1)
Lymphs: 53 %
MCH: 23.4 pg — ABNORMAL LOW (ref 26.6–33.0)
MCHC: 30.4 g/dL — ABNORMAL LOW (ref 31.5–35.7)
MCV: 77 fL — ABNORMAL LOW (ref 79–97)
Monocytes Absolute: 0.3 10*3/uL (ref 0.1–0.9)
Monocytes: 9 %
Neutrophils Absolute: 1.3 10*3/uL — ABNORMAL LOW (ref 1.4–7.0)
Neutrophils: 34 %
Platelets: 185 10*3/uL (ref 150–450)
RBC: 6.11 x10E6/uL — ABNORMAL HIGH (ref 3.77–5.28)
RDW: 17.4 % — ABNORMAL HIGH (ref 11.7–15.4)
WBC: 3.8 10*3/uL (ref 3.4–10.8)

## 2022-02-16 ENCOUNTER — Encounter: Payer: Self-pay | Admitting: Internal Medicine

## 2022-03-19 ENCOUNTER — Other Ambulatory Visit: Payer: Self-pay | Admitting: Internal Medicine

## 2022-03-19 DIAGNOSIS — K219 Gastro-esophageal reflux disease without esophagitis: Secondary | ICD-10-CM

## 2022-03-19 DIAGNOSIS — E119 Type 2 diabetes mellitus without complications: Secondary | ICD-10-CM

## 2022-03-21 NOTE — Telephone Encounter (Signed)
Requested Prescriptions  Pending Prescriptions Disp Refills  . valsartan-hydrochlorothiazide (DIOVAN-HCT) 160-12.5 MG tablet [Pharmacy Med Name: VALSARTAN/HCTZ 160MG/12.5MG TABLETS] 30 tablet 2    Sig: TAKE 1 TABLET BY MOUTH DAILY     Cardiovascular: ARB + Diuretic Combos Passed - 03/19/2022  5:30 AM      Passed - K in normal range and within 180 days    Potassium  Date Value Ref Range Status  02/10/2022 3.7 3.5 - 5.2 mmol/L Final         Passed - Na in normal range and within 180 days    Sodium  Date Value Ref Range Status  02/10/2022 143 134 - 144 mmol/L Final         Passed - Cr in normal range and within 180 days    Creatinine, Ser  Date Value Ref Range Status  02/10/2022 0.92 0.57 - 1.00 mg/dL Final         Passed - eGFR is 10 or above and within 180 days    GFR, Estimated  Date Value Ref Range Status  08/31/2021 56 (L) >60 mL/min Final    Comment:    (NOTE) Calculated using the CKD-EPI Creatinine Equation (2021)    eGFR  Date Value Ref Range Status  02/10/2022 63 >59 mL/min/1.73 Final         Passed - Patient is not pregnant      Passed - Last BP in normal range    BP Readings from Last 1 Encounters:  02/06/22 133/75         Passed - Valid encounter within last 6 months    Recent Outpatient Visits          1 month ago Diabetes mellitus without complication ([place])   [facility] Vigg, Avanti, MD   4 months ago Acute cough   [place], Santiago Glad, NP   4 months ago Diabetes mellitus without complication ([place])   [facility] Vigg, Avanti, MD   5 months ago Acute cough   [place] Vigg, Avanti, MD   5 months ago Acute gout of right hand, unspecified cause   [facility] McElwee, Scheryl Darter, NP      Future Appointments            In 1 month Kathrine Haddock, NP [employer], [place]

## 2022-03-24 ENCOUNTER — Other Ambulatory Visit: Payer: Self-pay | Admitting: Internal Medicine

## 2022-03-24 DIAGNOSIS — E119 Type 2 diabetes mellitus without complications: Secondary | ICD-10-CM

## 2022-03-24 DIAGNOSIS — K219 Gastro-esophageal reflux disease without esophagitis: Secondary | ICD-10-CM

## 2022-03-24 NOTE — Telephone Encounter (Signed)
Refused Diovan-HCT 160-12.5 mg tablet because this is a duplicate request.

## 2022-04-20 ENCOUNTER — Telehealth: Payer: Self-pay

## 2022-04-20 NOTE — Telephone Encounter (Signed)
Hand written order sent to pharmacy for Accu-chek test strips.

## 2022-04-21 ENCOUNTER — Other Ambulatory Visit: Payer: Self-pay

## 2022-04-21 ENCOUNTER — Other Ambulatory Visit: Payer: Self-pay | Admitting: Family Medicine

## 2022-04-21 MED ORDER — ALLOPURINOL 100 MG PO TABS: 100.0000 mg | ORAL_TABLET | Freq: Every day | ORAL | 0 refills | Status: DC

## 2022-04-21 NOTE — Telephone Encounter (Signed)
Please see if she has enough to make it to her appointment.  

## 2022-04-21 NOTE — Telephone Encounter (Signed)
Spoke with patient about her prescription refill and she stated that she will be out tomorrow.

## 2022-04-21 NOTE — Telephone Encounter (Signed)
Last seen on 02/06/22  Up coming visit 05/09/22 with Gabriel Cirri

## 2022-04-21 NOTE — Telephone Encounter (Signed)
Requested medication (s) are due for refill today:   Yes  Requested medication (s) are on the active medication list:   Yes  Future visit scheduled:   Yes   Last ordered: 04/21/2022 #30, 0 refills  Returned because a 90 day supply is being requested.     Requested Prescriptions  Pending Prescriptions Disp Refills   allopurinol (ZYLOPRIM) 100 MG tablet [Pharmacy Med Name: ALLOPURINOL 100MG  TABLETS] 90 tablet     Sig: TAKE 1 TABLET(100 MG) BY MOUTH DAILY     Endocrinology:  Gout Agents - allopurinol Passed - 04/21/2022 12:04 PM      Passed - Uric Acid in normal range and within 360 days    Uric Acid  Date Value Ref Range Status  12/28/2021 6.2 3.1 - 7.9 mg/dL Final    Comment:               Therapeutic target for gout patients: <6.0         Passed - Cr in normal range and within 360 days    Creatinine, Ser  Date Value Ref Range Status  02/10/2022 0.92 0.57 - 1.00 mg/dL Final         Passed - Valid encounter within last 12 months    Recent Outpatient Visits           2 months ago Diabetes mellitus without complication (HCC)   [facility] Vigg, Avanti, MD   5 months ago Acute cough   [facility] [place], [place], [place]   5 months ago Diabetes mellitus without complication (HCC)   [facility] Vigg, Avanti, MD   6 months ago Acute cough   [facility] Vigg, Avanti, MD   6 months ago Acute gout of right hand, unspecified cause   [facility] McElwee, Lauren A, NP       Future Appointments             In 2 weeks Clydie Braun, NP [facility]            Passed - CBC within normal limits and completed in the last 12 months    WBC  Date Value Ref Range Status  02/10/2022 3.8 3.4 - 10.8 x10E3/uL Final  08/31/2021 4.4 4.0 - 10.5 K/uL Final   RBC  Date Value Ref Range Status  02/10/2022 6.11 (H) 3.77 - 5.28 x10E6/uL Final  08/31/2021 6.05 (H) 3.87 - 5.11 MIL/uL Final   Hemoglobin   Date Value Ref Range Status  02/10/2022 14.3 11.1 - 15.9 g/dL Final   Hematocrit  Date Value Ref Range Status  02/10/2022 47.1 (H) 34.0 - 46.6 % Final   MCHC  Date Value Ref Range Status  02/10/2022 30.4 (L) 31.5 - 35.7 g/dL Final  02/12/2022 56/38/7564 30.0 - 36.0 g/dL Final   [facility]  Date Value Ref Range Status  02/10/2022 23.4 (L) 26.6 - 33.0 pg Final  08/31/2021 24.0 (L) 26.0 - 34.0 pg Final   MCV  Date Value Ref Range Status  02/10/2022 77 (L) 79 - 97 fL Final   No results found for: "PLTCOUNTKUC", "LABPLAT", "POCPLA" RDW  Date Value Ref Range Status  02/10/2022 17.4 (H) 11.7 - 15.4 % Final

## 2022-04-23 ENCOUNTER — Other Ambulatory Visit: Payer: Self-pay | Admitting: Internal Medicine

## 2022-04-24 NOTE — Telephone Encounter (Signed)
Requested medication (s) are due for refill today - expired Rx  Requested medication (s) are on the active medication list -yes  Future visit scheduled -yes  Last refill: 08/07/20   Notes to clinic: historical medication, PCP no longer at practice- sent for review   Requested Prescriptions  Pending Prescriptions Disp Refills   cloNIDine (CATAPRES) 0.2 MG tablet [Pharmacy Med Name: CLONIDINE 0.2MG  TABLETS] 90 tablet     Sig: TAKE 1 TABLET(0.2 MG) BY MOUTH TWICE DAILY     Cardiovascular:  Alpha-2 Agonists Passed - 04/23/2022 12:00 PM      Passed - Last BP in normal range    BP Readings from Last 1 Encounters:  02/06/22 133/75         Passed - Last Heart Rate in normal range    Pulse Readings from Last 1 Encounters:  02/06/22 78         Passed - Valid encounter within last 6 months    Recent Outpatient Visits           2 months ago Diabetes mellitus without complication (HCC)   [facility] Vigg, Avanti, MD   5 months ago Acute cough   [facility] [place], Clydie Braun, NP   5 months ago Diabetes mellitus without complication (HCC)   [facility] Vigg, Avanti, MD   6 months ago Acute cough   [facility] Vigg, Avanti, MD   6 months ago Acute gout of right hand, unspecified cause   [facility] McElwee, Jake Church, NP       Future Appointments             In 2 weeks Gabriel Cirri, NP [facility]               Requested Prescriptions  Pending Prescriptions Disp Refills   cloNIDine (CATAPRES) 0.2 MG tablet [Pharmacy Med Name: CLONIDINE 0.2MG  TABLETS] 90 tablet     Sig: TAKE 1 TABLET(0.2 MG) BY MOUTH TWICE DAILY     Cardiovascular:  Alpha-2 Agonists Passed - 04/23/2022 12:00 PM      Passed - Last BP in normal range    BP Readings from Last 1 Encounters:  02/06/22 133/75         Passed - Last Heart Rate in normal range    Pulse Readings from Last 1 Encounters:  02/06/22 78          Passed - Valid encounter within last 6 months    Recent Outpatient Visits           2 months ago Diabetes mellitus without complication (HCC)   [facility] Vigg, Avanti, MD   5 months ago Acute cough   [facility] [place], Clydie Braun, NP   5 months ago Diabetes mellitus without complication (HCC)   [facility] Vigg, Avanti, MD   6 months ago Acute cough   [facility] Vigg, Avanti, MD   6 months ago Acute gout of right hand, unspecified cause   [facility] McElwee, Jake Church, NP       Future Appointments             In 2 weeks Gabriel Cirri, NP [facility]

## 2022-04-26 NOTE — Telephone Encounter (Signed)
Patient has enough medication to last until she is seen on 7/18

## 2022-04-26 NOTE — Telephone Encounter (Signed)
Does she have enough to make it to next week? 

## 2022-04-29 ENCOUNTER — Emergency Department: Payer: Medicare Other

## 2022-04-29 ENCOUNTER — Emergency Department
Admission: EM | Admit: 2022-04-29 | Discharge: 2022-04-29 | Disposition: A | Discharge status: Home / Self Care | Payer: Medicare Other | Attending: Emergency Medicine | Admitting: Emergency Medicine

## 2022-04-29 ENCOUNTER — Other Ambulatory Visit: Payer: Self-pay

## 2022-04-29 DIAGNOSIS — R0602 Shortness of breath: Secondary | ICD-10-CM | POA: Diagnosis not present

## 2022-04-29 DIAGNOSIS — R0789 Other chest pain: Secondary | ICD-10-CM | POA: Insufficient documentation

## 2022-04-29 DIAGNOSIS — E119 Type 2 diabetes mellitus without complications: Secondary | ICD-10-CM | POA: Diagnosis not present

## 2022-04-29 DIAGNOSIS — R079 Chest pain, unspecified: Secondary | ICD-10-CM

## 2022-04-29 DIAGNOSIS — I10 Essential (primary) hypertension: Secondary | ICD-10-CM | POA: Diagnosis not present

## 2022-04-29 LAB — CBC
HCT: 45.7 % (ref 36.0–46.0)
Hemoglobin: 14 g/dL (ref 12.0–15.0)
MCH: 23.1 pg — ABNORMAL LOW (ref 26.0–34.0)
MCHC: 30.6 g/dL (ref 30.0–36.0)
MCV: 75.3 fL — ABNORMAL LOW (ref 80.0–100.0)
Platelets: 252 10*3/uL (ref 150–400)
RBC: 6.07 MIL/uL — ABNORMAL HIGH (ref 3.87–5.11)
RDW: 17.7 % — ABNORMAL HIGH (ref 11.5–15.5)
WBC: 5.3 10*3/uL (ref 4.0–10.5)
nRBC: 0 % (ref 0.0–0.2)

## 2022-04-29 LAB — BASIC METABOLIC PANEL
Anion gap: 8 (ref 5–15)
BUN: 12 mg/dL (ref 8–23)
CO2: 28 mmol/L (ref 22–32)
Calcium: 9.6 mg/dL (ref 8.9–10.3)
Chloride: 103 mmol/L (ref 98–111)
Creatinine, Ser: 0.91 mg/dL (ref 0.44–1.00)
GFR, Estimated: >60 mL/min (ref >60)
Glucose, Bld: 111 mg/dL — ABNORMAL HIGH (ref 70–99)
Potassium: 3.5 mmol/L (ref 3.5–5.1)
Sodium: 139 mmol/L (ref 135–145)

## 2022-04-29 LAB — TROPONIN I (HIGH SENSITIVITY)
Troponin I (High Sensitivity): 6 ng/L (ref <18)
Troponin I (High Sensitivity): 7 ng/L (ref <18)

## 2022-04-29 MED ORDER — IOHEXOL 350 MG/ML SOLN
75.0000 mL | Freq: Once | INTRAVENOUS | Status: AC | PRN
  Administered 2022-04-29: 75 mL via INTRAVENOUS

## 2022-04-29 NOTE — ED Triage Notes (Signed)
Patient to ER via POV with complaints of centralized, non-radiating chest pain that has been intermittent and started on Sunday. Reports she laid down today, when she got up she was extremely short of breath, reports the pain was the worst it has been and describes it as heavy. Denies cardiac history. Reports history of anxiety and took an ativan but this did not help with the pain.

## 2022-04-29 NOTE — Discharge Instructions (Signed)
Your work-up today in the emergency department is reassuring your cardiac enzymes were negative and your CAT scan did not show any blood clot.  We do not know exactly what is causing your pain.  Please follow-up with both your primary care doctor and cardiology.

## 2022-04-29 NOTE — ED Provider Notes (Signed)
[facility] Provider Note    Event Date/Time   First MD Initiated Contact with Patient 04/29/22 1300     (approximate)  History   Chief Complaint: Chest Pain  HPI  Vanessa Hogan is a 81 y.o. female with a past medical history of gastric reflux, diabetes, hypertension, anxiety, presents to the emergency department for chest pain.  According to the patient for the past week or so she has been experiencing intermittent chest pain which she describes as more of a pressure in the chest as well as shortness of breath.  Patient states she took a nap this morning and awoke with pain in her chest and a feeling like she could not take a deep breath.  Patient states this has been happening on and off for years but this was the worst episode yet.  No leg pain or swelling.  No history of DVT or PE.  No anticoagulation.  No past cardiac history no stents no heart attacks.  Patient states she has seen the cardiologist in the past has had stress tests in the past that have shown normal results per patient.  Physical Exam   Triage Vital Signs: ED Triage Vitals  Enc Vitals Group     BP 04/29/22 1224 (!) 182/79     Pulse Rate 04/29/22 1224 90     Resp 04/29/22 1224 18     Temp 04/29/22 1224 98.1 F (36.7 C)     Temp Source 04/29/22 1224 Oral     SpO2 04/29/22 1224 99 %     Weight --      Height --      Head Circumference --      Peak Flow --      Pain Score 04/29/22 1223 5     Pain Loc --      Pain Edu? --      Excl. in GC? --     Most recent vital signs: Vitals:   04/29/22 1224 04/29/22 1256  BP: (!) 182/79 (!) 170/85  Pulse: 90 81  Resp: 18 18  Temp: 98.1 F (36.7 C)   SpO2: 99% 96%    General: Awake, no distress.  CV:  Good peripheral perfusion.  Regular rate and rhythm  Resp:  Normal effort.  Equal breath sounds bilaterally.  Abd:  No distention.  Soft, nontender.  No rebound or guarding. Other:  No lower extremity edema or tenderness.   ED  Results / Procedures / Treatments   EKG  EKG viewed and interpreted by myself shows a sinus rhythm at 84 bpm with a narrow QRS, left axis deviation, prolonged PR interval otherwise normal intervals, nonspecific ST changes.  RADIOLOGY  I have viewed and interpreted the chest x-ray images.  I do not see any obvious abnormality on my evaluation.    MEDICATIONS ORDERED IN ED: Medications - No data to display   IMPRESSION / MDM / ASSESSMENT AND PLAN / ED COURSE  I reviewed the triage vital signs and the nursing notes.  Patient's presentation is most consistent with acute presentation with potential threat to life or bodily function.  Patient presents emergency department for chest pain and shortness of breath patient states intermittent times years, worse over this past 1 week and this morning was the worst episode yet.  Patient's labs are reassuring including a normal CBC, normal chemistry and a negative troponin.  Chest x-ray appears clear on my evaluation awaiting radiology read, EKG shows no concerning findings.  Given the patient's acute onset of chest pain with shortness of breath we will proceed with a CTA of the chest as the patient does not believe she has ever had a CAT scan of the chest previously to rule out pulmonary embolism and to get a better look at the patient's lungs.  Patient agreeable to plan.  Patient states very slight discomfort currently much improved from earlier.  Lab work is reassuring.  CTA of the chest is pending.  Patient care signed out to oncoming provider.  FINAL CLINICAL IMPRESSION(S) / ED DIAGNOSES   Chest pain Shortness of breath    Note:  This document was prepared using Dragon voice recognition software and may include unintentional dictation errors.   Minna Antis, MD 04/29/22 1407

## 2022-04-29 NOTE — ED Provider Notes (Signed)
Patient signed out to me pending CTA and repeat troponin.  She is a 81 year old female presenting with intermittent pleuritic chest pain.  Her repeat troponin is negative and her CTA is negative for PE or other acute findings.  I discussed results with the patient.  She is appropriate for discharge at this time.   Georga Hacking, MD 04/29/22 [phone]

## 2022-04-29 NOTE — ED Notes (Signed)
Pt A&O, IV removed, pt given discharge instructions, pt ambulating with steady gait. 

## 2022-05-08 DIAGNOSIS — R0602 Shortness of breath: Secondary | ICD-10-CM | POA: Insufficient documentation

## 2022-05-09 ENCOUNTER — Encounter: Payer: Self-pay | Admitting: Unknown Physician Specialty

## 2022-05-09 ENCOUNTER — Ambulatory Visit (INDEPENDENT_AMBULATORY_CARE_PROVIDER_SITE_OTHER): Payer: Medicare Other | Admitting: Unknown Physician Specialty

## 2022-05-09 VITALS — BP 159/92 | HR 71 | Temp 98.8°F | Wt 211.0 lb

## 2022-05-09 DIAGNOSIS — R079 Chest pain, unspecified: Secondary | ICD-10-CM | POA: Diagnosis not present

## 2022-05-09 DIAGNOSIS — E119 Type 2 diabetes mellitus without complications: Secondary | ICD-10-CM

## 2022-05-09 DIAGNOSIS — I10 Essential (primary) hypertension: Secondary | ICD-10-CM

## 2022-05-09 MED ORDER — PANTOPRAZOLE SODIUM 40 MG PO TBEC: 40.0000 mg | DELAYED_RELEASE_TABLET | Freq: Every day | ORAL | 3 refills | Status: DC

## 2022-05-09 NOTE — Assessment & Plan Note (Signed)
A little elevated but taking Diclofenac and OK yesterday

## 2022-05-09 NOTE — Progress Notes (Signed)
BP (!) 159/92 (BP Location: Left Arm, Cuff Size: Normal)   Pulse 71   Temp 98.8 F (37.1 C) (Oral)   Wt 211 lb (95.7 kg)   SpO2 95%   BMI 34.57 kg/m    Subjective:    Patient ID: Vanessa Hogan, female    DOB: 1941-07-01, 81 y.o.   MRN: [id number]  HPI: Vanessa Hogan is a 81 y.o. female  Chief Complaint  Patient presents with   Depression   Diabetes    No recent eye exam per patient   Hypertension   Gastroesophageal Reflux   Diabetes: Takes 70/30 insulin and takes 18 u in the AM and 22 in the PM.  Takes it if BS is above 150.  Takes Metformin prn as it gives her a headache. Takes the Ozempic when she can afford it.   No hypoglycemic episodes No hyperglycemic episodes Feet problems: eye exam within last year: 2 years ago Last Hgb A1C: 6.0  Hypertension  Using medications without difficulty Average home BPs: Not checking.  Yesterday SBP of 120 at the cardiologist   Using medication without problems or lightheadedness No chest pain with exertion or shortness of breath No Edema  Elevated Cholesterol Using medications without problems No Muscle aches  Diet: Exercise: Walking    Went to the ER for pleuritic chest pain.  R/o for cardiac cause.  Pt feels it is related to gastric reflux.  Takes Diclofenac for the pain.  Taking at Omeprazole 20 mg.    Relevant past medical, surgical, family and social history reviewed and updated as indicated. Interim medical history since our last visit reviewed. Allergies and medications reviewed and updated.  Review of Systems  Per HPI unless specifically indicated above     Objective:    BP (!) 159/92 (BP Location: Left Arm, Cuff Size: Normal)   Pulse 71   Temp 98.8 F (37.1 C) (Oral)   Wt 211 lb (95.7 kg)   SpO2 95%   BMI 34.57 kg/m   Wt Readings from Last 3 Encounters:  05/09/22 211 lb (95.7 kg)  02/06/22 208 lb (94.3 kg)  01/30/22 208 lb 4 oz (94.5 kg)    Physical Exam Constitutional:       General: She is not in acute distress.    Appearance: Normal appearance. She is well-developed.  HENT:     Head: Normocephalic and atraumatic.  Eyes:     General: Lids are normal. No scleral icterus.       Right eye: No discharge.        Left eye: No discharge.     Conjunctiva/sclera: Conjunctivae normal.  Neck:     Vascular: No carotid bruit or JVD.  Cardiovascular:     Rate and Rhythm: Normal rate and regular rhythm.     Heart sounds: Normal heart sounds.  Pulmonary:     Effort: Pulmonary effort is normal. No respiratory distress.     Breath sounds: Normal breath sounds.  Abdominal:     Palpations: There is no hepatomegaly or splenomegaly.  Musculoskeletal:        General: Normal range of motion.     Cervical back: Normal range of motion and neck supple.  Skin:    General: Skin is warm and dry.     Coloration: Skin is not pale.     Findings: No rash.  Neurological:     Mental Status: She is alert and oriented to person, place, and time.  Psychiatric:  Behavior: Behavior normal.        Thought Content: Thought content normal.        Judgment: Judgment normal.        Assessment & Plan:   Problem List Items Addressed This Visit       Unprioritized   Diabetes mellitus without complication (HCC) (Chronic)    Hgb A1C was 6 last visit with stable CMP.  Will recheck in 3 months      Relevant Medications   OZEMPIC, 1 MG/DOSE, 4 MG/3ML SOPN   Other Relevant Orders   Ambulatory referral to Ophthalmology   Essential hypertension (Chronic)    A little elevated but taking Diclofenac and OK yesterday      Other Visit Diagnoses     Chest pain, unspecified type    -  Primary   Went to the ER and cardiac causes ruled out.  ? reflux.  Change Omeprazole to Protonix.  Stop Diclofenac.          Follow up plan: Return in about 4 weeks (around 06/06/2022).

## 2022-05-09 NOTE — Assessment & Plan Note (Signed)
Hgb A1C was 6 last visit with stable CMP.  Will recheck in 3 months

## 2022-05-19 ENCOUNTER — Encounter: Payer: Self-pay | Admitting: Unknown Physician Specialty

## 2022-05-19 LAB — HM DIABETES EYE EXAM

## 2022-05-20 ENCOUNTER — Other Ambulatory Visit: Payer: Self-pay | Admitting: Family Medicine

## 2022-05-22 ENCOUNTER — Other Ambulatory Visit: Payer: Self-pay | Admitting: Family Medicine

## 2022-05-22 NOTE — Telephone Encounter (Signed)
Requested Prescriptions  Pending Prescriptions Disp Refills  . allopurinol (ZYLOPRIM) 100 MG tablet [Pharmacy Med Name: ALLOPURINOL 100MG  TABLETS] 30 tablet 0    Sig: TAKE 1 TABLET(100 MG) BY MOUTH DAILY     Endocrinology:  Gout Agents - allopurinol Passed - 05/20/2022  8:42 AM      Passed - Uric Acid in normal range and within 360 days    Uric Acid  Date Value Ref Range Status  12/28/2021 6.2 3.1 - 7.9 mg/dL Final    Comment:               Therapeutic target for gout patients: <6.0         Passed - Cr in normal range and within 360 days    Creatinine, Ser  Date Value Ref Range Status  04/29/2022 0.91 0.44 - 1.00 mg/dL Final         Passed - Valid encounter within last 12 months    Recent Outpatient Visits          1 week ago Chest pain, unspecified type   [facility], NP   3 months ago Diabetes mellitus without complication (HCC)   [facility] Vigg, Avanti, MD   6 months ago Acute cough   [facility], NP   6 months ago Diabetes mellitus without complication (HCC)   [facility] Vigg, Avanti, MD   7 months ago Acute cough   [facility] Vigg, Avanti, MD      Future Appointments            In 2 weeks Larae Grooms, NP [facility], [facility]           Passed - CBC within normal limits and completed in the last 12 months    WBC  Date Value Ref Range Status  04/29/2022 5.3 4.0 - 10.5 K/uL Final   RBC  Date Value Ref Range Status  04/29/2022 6.07 (H) 3.87 - 5.11 MIL/uL Final   Hemoglobin  Date Value Ref Range Status  04/29/2022 14.0 12.0 - 15.0 g/dL Final  06/30/2022 04/15/7627 11.1 - 15.9 g/dL Final   HCT  Date Value Ref Range Status  04/29/2022 45.7 36.0 - 46.0 % Final   Hematocrit  Date Value Ref Range Status  02/10/2022 47.1 (H) 34.0 - 46.6 % Final   MCHC  Date Value Ref Range Status  04/29/2022 30.6 30.0 - 36.0 g/dL Final   [facility]  Date Value Ref Range  Status  04/29/2022 23.1 (L) 26.0 - 34.0 pg Final   MCV  Date Value Ref Range Status  04/29/2022 75.3 (L) 80.0 - 100.0 fL Final  02/10/2022 77 (L) 79 - 97 fL Final   No results found for: "PLTCOUNTKUC", "LABPLAT", "POCPLA" RDW  Date Value Ref Range Status  04/29/2022 17.7 (H) 11.5 - 15.5 % Final  02/10/2022 17.4 (H) 11.7 - 15.4 % Final

## 2022-05-23 NOTE — Telephone Encounter (Signed)
Requested medication (s) are due for refill today - no  Requested medication (s) are on the active medication list -yes  Future visit scheduled -yes  Last refill: 05/22/22 #30  Notes to clinic: Patient request 90 day supply- appointment in 2 weeks  Requested Prescriptions  Pending Prescriptions Disp Refills   allopurinol (ZYLOPRIM) 100 MG tablet [Pharmacy Med Name: ALLOPURINOL 100MG  TABLETS] 90 tablet     Sig: TAKE 1 TABLET(100 MG) BY MOUTH DAILY     Endocrinology:  Gout Agents - allopurinol Passed - 05/22/2022  1:13 PM      Passed - Uric Acid in normal range and within 360 days    Uric Acid  Date Value Ref Range Status  12/28/2021 6.2 3.1 - 7.9 mg/dL Final    Comment:               Therapeutic target for gout patients: <6.0         Passed - Cr in normal range and within 360 days    Creatinine, Ser  Date Value Ref Range Status  04/29/2022 0.91 0.44 - 1.00 mg/dL Final         Passed - Valid encounter within last 12 months    Recent Outpatient Visits           2 weeks ago Chest pain, unspecified type   [facility], NP   3 months ago Diabetes mellitus without complication (HCC)   [facility] Vigg, Avanti, MD   6 months ago Acute cough   [facility] [place] [place], [place]   6 months ago Diabetes mellitus without complication (HCC)   [facility] Vigg, Avanti, MD   7 months ago Acute cough   [facility] Vigg, Avanti, MD       Future Appointments             In 2 weeks Clydie Braun, NP [facility]            Passed - CBC within normal limits and completed in the last 12 months    WBC  Date Value Ref Range Status  04/29/2022 5.3 4.0 - 10.5 K/uL Final   RBC  Date Value Ref Range Status  04/29/2022 6.07 (H) 3.87 - 5.11 MIL/uL Final   Hemoglobin  Date Value Ref Range Status  04/29/2022 14.0 12.0 - 15.0 g/dL Final  06/30/2022 53/61/4431 11.1 - 15.9 g/dL Final   HCT   Date Value Ref Range Status  04/29/2022 45.7 36.0 - 46.0 % Final   Hematocrit  Date Value Ref Range Status  02/10/2022 47.1 (H) 34.0 - 46.6 % Final   MCHC  Date Value Ref Range Status  04/29/2022 30.6 30.0 - 36.0 g/dL Final   [facility]  Date Value Ref Range Status  04/29/2022 23.1 (L) 26.0 - 34.0 pg Final   MCV  Date Value Ref Range Status  04/29/2022 75.3 (L) 80.0 - 100.0 fL Final  02/10/2022 77 (L) 79 - 97 fL Final   No results found for: "PLTCOUNTKUC", "LABPLAT", "POCPLA" RDW  Date Value Ref Range Status  04/29/2022 17.7 (H) 11.5 - 15.5 % Final  02/10/2022 17.4 (H) 11.7 - 15.4 % Final            Requested Prescriptions  Pending Prescriptions Disp Refills   allopurinol (ZYLOPRIM) 100 MG tablet [Pharmacy Med Name: ALLOPURINOL 100MG  TABLETS] 90 tablet     Sig: TAKE 1 TABLET(100 MG) BY MOUTH DAILY  Endocrinology:  Gout Agents - allopurinol Passed - 05/22/2022  1:13 PM      Passed - Uric Acid in normal range and within 360 days    Uric Acid  Date Value Ref Range Status  12/28/2021 6.2 3.1 - 7.9 mg/dL Final    Comment:               Therapeutic target for gout patients: <6.0         Passed - Cr in normal range and within 360 days    Creatinine, Ser  Date Value Ref Range Status  04/29/2022 0.91 0.44 - 1.00 mg/dL Final         Passed - Valid encounter within last 12 months    Recent Outpatient Visits           2 weeks ago Chest pain, unspecified type   [facility] Gabriel Cirri, NP   3 months ago Diabetes mellitus without complication (HCC)   [facility] Vigg, Avanti, MD   6 months ago Acute cough   [facility] Larae Grooms, NP   6 months ago Diabetes mellitus without complication (HCC)   [facility] Vigg, Avanti, MD   7 months ago Acute cough   [facility] Vigg, Avanti, MD       Future Appointments             In 2 weeks Gabriel Cirri, NP [facility]             Passed - CBC within normal limits and completed in the last 12 months    WBC  Date Value Ref Range Status  04/29/2022 5.3 4.0 - 10.5 K/uL Final   RBC  Date Value Ref Range Status  04/29/2022 6.07 (H) 3.87 - 5.11 MIL/uL Final   Hemoglobin  Date Value Ref Range Status  04/29/2022 14.0 12.0 - 15.0 g/dL Final  60/63/0160 10.9 11.1 - 15.9 g/dL Final   HCT  Date Value Ref Range Status  04/29/2022 45.7 36.0 - 46.0 % Final   Hematocrit  Date Value Ref Range Status  02/10/2022 47.1 (H) 34.0 - 46.6 % Final   MCHC  Date Value Ref Range Status  04/29/2022 30.6 30.0 - 36.0 g/dL Final   [facility]  Date Value Ref Range Status  04/29/2022 23.1 (L) 26.0 - 34.0 pg Final   MCV  Date Value Ref Range Status  04/29/2022 75.3 (L) 80.0 - 100.0 fL Final  02/10/2022 77 (L) 79 - 97 fL Final   No results found for: "PLTCOUNTKUC", "LABPLAT", "POCPLA" RDW  Date Value Ref Range Status  04/29/2022 17.7 (H) 11.5 - 15.5 % Final  02/10/2022 17.4 (H) 11.7 - 15.4 % Final

## 2022-05-24 ENCOUNTER — Other Ambulatory Visit: Payer: Self-pay | Admitting: Family Medicine

## 2022-05-25 ENCOUNTER — Encounter: Payer: Self-pay | Admitting: Family

## 2022-05-25 NOTE — Telephone Encounter (Signed)
Third request from different [facility], calling to verify if pt has picked up rx. Pt has picked up rx at the other [facility] in [place].

## 2022-05-25 NOTE — Telephone Encounter (Signed)
Pt has picked up rx at different [facility]. Requested Prescriptions  Pending Prescriptions Disp Refills  . allopurinol (ZYLOPRIM) 100 MG tablet [Pharmacy Med Name: ALLOPURINOL 100MG  TABLETS] 90 tablet     Sig: TAKE 1 TABLET(100 MG) BY MOUTH DAILY     Endocrinology:  Gout Agents - allopurinol Passed - 05/24/2022  5:33 PM      Passed - Uric Acid in normal range and within 360 days    Uric Acid  Date Value Ref Range Status  12/28/2021 6.2 3.1 - 7.9 mg/dL Final    Comment:               Therapeutic target for gout patients: <6.0         Passed - Cr in normal range and within 360 days    Creatinine, Ser  Date Value Ref Range Status  04/29/2022 0.91 0.44 - 1.00 mg/dL Final         Passed - Valid encounter within last 12 months    Recent Outpatient Visits          2 weeks ago Chest pain, unspecified type   [facility], NP   3 months ago Diabetes mellitus without complication (HCC)   [facility] Vigg, Avanti, MD   6 months ago Acute cough   [facility] [place], [place]   6 months ago Diabetes mellitus without complication (HCC)   [facility] Vigg, Avanti, MD   7 months ago Acute cough   [facility] Vigg, Avanti, MD      Future Appointments            In 1 month Clydie Braun, NP [facility]           Passed - CBC within normal limits and completed in the last 12 months    WBC  Date Value Ref Range Status  04/29/2022 5.3 4.0 - 10.5 K/uL Final   RBC  Date Value Ref Range Status  04/29/2022 6.07 (H) 3.87 - 5.11 MIL/uL Final   Hemoglobin  Date Value Ref Range Status  04/29/2022 14.0 12.0 - 15.0 g/dL Final  06/30/2022 35/36/1443 11.1 - 15.9 g/dL Final   HCT  Date Value Ref Range Status  04/29/2022 45.7 36.0 - 46.0 % Final   Hematocrit  Date Value Ref Range Status  02/10/2022 47.1 (H) 34.0 - 46.6 % Final   MCHC  Date Value Ref Range Status  04/29/2022 30.6 30.0 - 36.0  g/dL Final   [facility]  Date Value Ref Range Status  04/29/2022 23.1 (L) 26.0 - 34.0 pg Final   MCV  Date Value Ref Range Status  04/29/2022 75.3 (L) 80.0 - 100.0 fL Final  02/10/2022 77 (L) 79 - 97 fL Final   No results found for: "PLTCOUNTKUC", "LABPLAT", "POCPLA" RDW  Date Value Ref Range Status  04/29/2022 17.7 (H) 11.5 - 15.5 % Final  02/10/2022 17.4 (H) 11.7 - 15.4 % Final

## 2022-06-06 ENCOUNTER — Ambulatory Visit: Payer: Medicare Other | Admitting: Unknown Physician Specialty

## 2022-06-09 ENCOUNTER — Other Ambulatory Visit: Payer: Self-pay | Admitting: Internal Medicine

## 2022-06-09 MED ORDER — ACCU-CHEK GUIDE VI STRP: 1.0000 | ORAL_STRIP | Freq: Three times a day (TID) | 12 refills | Status: DC

## 2022-06-09 NOTE — Telephone Encounter (Signed)
Medication Refill - Medication: ACCU-CHEK GUIDE test strip. Please note on script twice daily or patient insurance will not cover.   Has the patient contacted their pharmacy? Yes.    (Agent: If yes, when and what did the pharmacy advise?) Contact PCP office. Reached twice and no response  Preferred Pharmacy (with phone number or street name):   [id number] [place], [place] - [address] AT [facility] [address] Phone:  [phone]  Fax:  [phone]      Has the patient been seen for an appointment in the last year OR does the patient have an upcoming appointment? Yes.    Agent: Please be advised that RX refills may take up to 3 business days. We ask that you follow-up with your pharmacy.

## 2022-06-10 ENCOUNTER — Other Ambulatory Visit: Payer: Self-pay | Admitting: Internal Medicine

## 2022-06-10 DIAGNOSIS — E119 Type 2 diabetes mellitus without complications: Secondary | ICD-10-CM

## 2022-06-10 DIAGNOSIS — K219 Gastro-esophageal reflux disease without esophagitis: Secondary | ICD-10-CM

## 2022-06-10 MED ORDER — HUMALOG MIX 75/25 KWIKPEN (75-25) 100 UNIT/ML ~~LOC~~ SUPN: PEN_INJECTOR | SUBCUTANEOUS | 0 refills | Status: DC

## 2022-06-21 ENCOUNTER — Other Ambulatory Visit: Payer: Self-pay | Admitting: Family Medicine

## 2022-06-21 NOTE — Telephone Encounter (Signed)
Requested Prescriptions  Pending Prescriptions Disp Refills  . allopurinol (ZYLOPRIM) 100 MG tablet [Pharmacy Med Name: ALLOPURINOL 100MG  TABLETS] 90 tablet 0    Sig: TAKE 1 TABLET(100 MG) BY MOUTH DAILY     Endocrinology:  Gout Agents - allopurinol Passed - 06/21/2022  8:19 AM      Passed - Uric Acid in normal range and within 360 days    Uric Acid  Date Value Ref Range Status  12/28/2021 6.2 3.1 - 7.9 mg/dL Final    Comment:               Therapeutic target for gout patients: <6.0         Passed - Cr in normal range and within 360 days    Creatinine, Ser  Date Value Ref Range Status  04/29/2022 0.91 0.44 - 1.00 mg/dL Final         Passed - Valid encounter within last 12 months    Recent Outpatient Visits          1 month ago Chest pain, unspecified type   [facility], NP   4 months ago Diabetes mellitus without complication (HCC)   [facility] Vigg, Avanti, MD   7 months ago Acute cough   [facility] [place] [place], [place]   7 months ago Diabetes mellitus without complication (HCC)   [facility] Vigg, Avanti, MD   8 months ago Acute cough   [facility] Vigg, Avanti, MD      Future Appointments            In 1 week Clydie Braun, NP [facility]           Passed - CBC within normal limits and completed in the last 12 months    WBC  Date Value Ref Range Status  04/29/2022 5.3 4.0 - 10.5 K/uL Final   RBC  Date Value Ref Range Status  04/29/2022 6.07 (H) 3.87 - 5.11 MIL/uL Final   Hemoglobin  Date Value Ref Range Status  04/29/2022 14.0 12.0 - 15.0 g/dL Final  06/30/2022 75/91/6384 11.1 - 15.9 g/dL Final   HCT  Date Value Ref Range Status  04/29/2022 45.7 36.0 - 46.0 % Final   Hematocrit  Date Value Ref Range Status  02/10/2022 47.1 (H) 34.0 - 46.6 % Final   MCHC  Date Value Ref Range Status  04/29/2022 30.6 30.0 - 36.0 g/dL Final   [facility]  Date Value Ref Range  Status  04/29/2022 23.1 (L) 26.0 - 34.0 pg Final   MCV  Date Value Ref Range Status  04/29/2022 75.3 (L) 80.0 - 100.0 fL Final  02/10/2022 77 (L) 79 - 97 fL Final   No results found for: "PLTCOUNTKUC", "LABPLAT", "POCPLA" RDW  Date Value Ref Range Status  04/29/2022 17.7 (H) 11.5 - 15.5 % Final  02/10/2022 17.4 (H) 11.7 - 15.4 % Final

## 2022-06-22 ENCOUNTER — Other Ambulatory Visit: Payer: Self-pay

## 2022-06-22 DIAGNOSIS — E119 Type 2 diabetes mellitus without complications: Secondary | ICD-10-CM

## 2022-06-22 DIAGNOSIS — K219 Gastro-esophageal reflux disease without esophagitis: Secondary | ICD-10-CM

## 2022-06-22 NOTE — Telephone Encounter (Signed)
LOV 05/09/22  Future appt 06/29/22

## 2022-06-26 MED ORDER — VALSARTAN-HYDROCHLOROTHIAZIDE 160-12.5 MG PO TABS
1.0000 | ORAL_TABLET | Freq: Every day | ORAL | 0 refills | Status: DC
Start: 2022-06-26 — End: 2022-06-29

## 2022-06-27 ENCOUNTER — Other Ambulatory Visit: Payer: Self-pay | Admitting: Family Medicine

## 2022-06-27 DIAGNOSIS — E119 Type 2 diabetes mellitus without complications: Secondary | ICD-10-CM

## 2022-06-27 DIAGNOSIS — K219 Gastro-esophageal reflux disease without esophagitis: Secondary | ICD-10-CM

## 2022-06-28 NOTE — Telephone Encounter (Signed)
Requested medication (s) are due for refill today: no  Requested medication (s) are on the active medication list: yes Last refill:  06/26/22  Future visit scheduled: yes  Notes to clinic:  Unable to refill per protocol, last refill by provider 06/26/22 for 30 days. Possible duplicate     Requested Prescriptions  Pending Prescriptions Disp Refills   valsartan-hydrochlorothiazide (DIOVAN-HCT) 160-12.5 MG tablet [Pharmacy Med Name: VALSARTAN/HCTZ 160MG/12.5MG TABLETS] 90 tablet     Sig: Take 1 tablet by mouth daily.     Cardiovascular: ARB + Diuretic Combos Failed - 06/27/2022  7:06 AM      Failed - Last BP in normal range    BP Readings from Last 1 Encounters:  05/09/22 (!) 159/92         Passed - K in normal range and within 180 days    Potassium  Date Value Ref Range Status  04/29/2022 3.5 3.5 - 5.1 mmol/L Final         Passed - Na in normal range and within 180 days    Sodium  Date Value Ref Range Status  04/29/2022 139 135 - 145 mmol/L Final  02/10/2022 143 134 - 144 mmol/L Final         Passed - Cr in normal range and within 180 days    Creatinine, Ser  Date Value Ref Range Status  04/29/2022 0.91 0.44 - 1.00 mg/dL Final         Passed - eGFR is 10 or above and within 180 days    GFR, Estimated  Date Value Ref Range Status  04/29/2022 >60 >60 mL/min Final    Comment:    (NOTE) Calculated using the CKD-EPI Creatinine Equation (2021)    eGFR  Date Value Ref Range Status  02/10/2022 63 >59 mL/min/1.73 Final         Passed - Patient is not pregnant      Passed - Valid encounter within last 6 months    Recent Outpatient Visits           1 month ago Chest pain, unspecified type   [facility] Kathrine Haddock, NP   4 months ago Diabetes mellitus without complication ([place])   [facility] Vigg, Avanti, MD   7 months ago Acute cough   [place], NP   8 months ago Diabetes mellitus without complication  ([place])   [facility] Vigg, Avanti, MD   8 months ago Acute cough   [place] Vigg, Avanti, MD       Future Appointments             Tomorrow Kathrine Haddock, NP [facility], [facility]

## 2022-06-29 ENCOUNTER — Other Ambulatory Visit: Payer: Self-pay | Admitting: Family Medicine

## 2022-06-29 ENCOUNTER — Ambulatory Visit (INDEPENDENT_AMBULATORY_CARE_PROVIDER_SITE_OTHER): Payer: Medicare Other | Admitting: Unknown Physician Specialty

## 2022-06-29 ENCOUNTER — Encounter: Payer: Self-pay | Admitting: Unknown Physician Specialty

## 2022-06-29 DIAGNOSIS — M179 Osteoarthritis of knee, unspecified: Secondary | ICD-10-CM | POA: Insufficient documentation

## 2022-06-29 DIAGNOSIS — K219 Gastro-esophageal reflux disease without esophagitis: Secondary | ICD-10-CM

## 2022-06-29 DIAGNOSIS — M7542 Impingement syndrome of left shoulder: Secondary | ICD-10-CM | POA: Diagnosis not present

## 2022-06-29 DIAGNOSIS — E119 Type 2 diabetes mellitus without complications: Secondary | ICD-10-CM

## 2022-06-29 DIAGNOSIS — M170 Bilateral primary osteoarthritis of knee: Secondary | ICD-10-CM

## 2022-06-29 DIAGNOSIS — F419 Anxiety disorder, unspecified: Secondary | ICD-10-CM

## 2022-06-29 MED ORDER — VALSARTAN-HYDROCHLOROTHIAZIDE 160-12.5 MG PO TABS: 1.0000 | ORAL_TABLET | Freq: Every day | ORAL | 2 refills | Status: DC

## 2022-06-29 MED ORDER — CLONAZEPAM 0.5 MG PO TABS: 0.5000 mg | ORAL_TABLET | Freq: Every day | ORAL | 0 refills | Status: DC | PRN

## 2022-06-29 NOTE — Assessment & Plan Note (Signed)
Bilateral.  Discussed activity and Tylenol prn

## 2022-06-29 NOTE — Assessment & Plan Note (Signed)
Has taken Clonazepam for many years.  She did not do well with SSRIs.  Pt aware of Clonazepam associated risks at her age.  Will rx #30 and recheck in 3 months

## 2022-06-29 NOTE — Progress Notes (Signed)
BP (!) 150/80   Pulse 67   Temp 98.7 F (37.1 C) (Oral)   Ht 5' 5.51" (1.664 m)   Wt 218 lb 14.4 oz (99.3 kg)   SpO2 97%   BMI 35.86 kg/m    Subjective:    Patient ID: Vanessa Hogan, female    DOB: 06-Feb-1941, 81 y.o.   MRN: [id number]  HPI: Vanessa Hogan is a 81 y.o. female  Chief Complaint  Patient presents with   Chest Pain     F/u from 05/09/22   Depression   Chest pain is better since starting  Protonix and stopping the Vytorin.    Depression: Pt states it comes and goes.  States Lorazepam has worked well in the past and taking it since 2015.  States #90 last her a year.  She would like to have a prescription.  No falls.  She states Dr. Charlotta Newton gave her Citalopram which she did not do well with.  She also got Zoloft previously did and was zoned out.   Diabetes- Out of Ozempic and can't afford to get more.  Readings have been mid 100 to low 200's.  This is different for her     06/29/2022    2:01 PM 05/09/2022    1:37 PM 02/06/2022   11:46 AM 11/17/2021    2:12 PM 10/28/2021   11:13 AM  Depression screen PHQ 2/9  Decreased Interest 0 0 0 0 1  Down, Depressed, Hopeless 0 2 1 0 1  PHQ - 2 Score 0 2 1 0 2  Altered sleeping 0 2 0 1 0  Tired, decreased energy 1 2 1  0 1  Change in appetite 1 2 0 0 1  Feeling bad or failure about yourself  0 0 0 0 0  Trouble concentrating 0 0 0 0 0  Moving slowly or fidgety/restless 0 0 0 0 0  Suicidal thoughts 0 0 0 0 0  PHQ-9 Score 2 8 2 1 4   Difficult doing work/chores Not difficult at all Somewhat difficult Not difficult at all Not difficult at all    Arthritis: Bilateral knee pain going up and down the steps.  Uses cane as needed.  Daily pain of left shoulder.  States she is up all day doing something.    Relevant past medical, surgical, family and social history reviewed and updated as indicated. Interim medical history since our last visit reviewed. Allergies and medications reviewed and updated.  Review of  Systems  Per HPI unless specifically indicated above     Objective:    BP (!) 150/80   Pulse 67   Temp 98.7 F (37.1 C) (Oral)   Ht 5' 5.51" (1.664 m)   Wt 218 lb 14.4 oz (99.3 kg)   SpO2 97%   BMI 35.86 kg/m   Wt Readings from Last 3 Encounters:  06/29/22 218 lb 14.4 oz (99.3 kg)  05/09/22 211 lb (95.7 kg)  02/06/22 208 lb (94.3 kg)    Physical Exam Constitutional:      General: She is not in acute distress.    Appearance: Normal appearance. She is well-developed.  HENT:     Head: Normocephalic and atraumatic.  Eyes:     General: Lids are normal. No scleral icterus.       Right eye: No discharge.        Left eye: No discharge.     Conjunctiva/sclera: Conjunctivae normal.  Neck:     Vascular: No carotid bruit or  JVD.  Cardiovascular:     Rate and Rhythm: Normal rate and regular rhythm.     Heart sounds: Normal heart sounds.  Pulmonary:     Effort: Pulmonary effort is normal. No respiratory distress.     Breath sounds: Normal breath sounds.  Abdominal:     Palpations: There is no hepatomegaly or splenomegaly.  Musculoskeletal:     Cervical back: Normal range of motion and neck supple.     Comments: Positive Neers and Hawkins.  Negative empty can  Skin:    General: Skin is warm and dry.     Coloration: Skin is not pale.     Findings: No rash.  Neurological:     Mental Status: She is alert and oriented to person, place, and time.  Psychiatric:        Behavior: Behavior normal.        Thought Content: Thought content normal.        Judgment: Judgment normal.     Results for orders placed or performed in visit on 06/07/22  HM DIABETES EYE EXAM  Result Value Ref Range   HM Diabetic Eye Exam No Retinopathy No Retinopathy      Assessment & Plan:   Problem List Items Addressed This Visit       Unprioritized   Diabetes mellitus without complication (HCC) (Chronic)    Out of Ozempic and struggling with keeping glucose under control as can't afford it.   Samples given today and will refer to pharmacist for options      Relevant Medications   valsartan-hydrochlorothiazide (DIOVAN-HCT) 160-12.5 MG tablet   Other Relevant Orders   AMB Referral to [facility]ordin[facility]   Anxiety    Has taken Clonazepam for many years.  She did not do well with SSRIs.  Pt aware of Clonazepam associated risks at her age.  Will rx #30 and recheck in 3 months      Gastroesophageal reflux disease   Relevant Medications   valsartan-hydrochlorothiazide (DIOVAN-HCT) 160-12.5 MG tablet   Osteoarthritis, knee    Bilateral.  Discussed activity and Tylenol prn      Relevant Orders   Ambulatory referral to Physical Therapy   Shoulder impingement syndrome, left    Positive Hawkin's and Neer  Will refer to PT for further management      Relevant Orders   Ambulatory referral to Physical Therapy     Follow up plan: Return in about 3 months (around 09/28/2022).

## 2022-06-29 NOTE — Assessment & Plan Note (Signed)
Positive Hawkin's and Neer  Will refer to PT for further management

## 2022-06-29 NOTE — Assessment & Plan Note (Addendum)
Out of Ozempic and struggling with keeping glucose under control as can't afford it.  Samples given today and will refer to pharmacist for options

## 2022-06-30 NOTE — Telephone Encounter (Signed)
Pt requesting med to be sent to a different pharmacy  Requested Prescriptions  Pending Prescriptions Disp Refills  . valsartan-hydrochlorothiazide (DIOVAN-HCT) 160-12.5 MG tablet [Pharmacy Med Name: VALSARTAN/HCTZ 160MG/12.5MG TABLETS] 90 tablet 2    Sig: TAKE 1 TABLET BY MOUTH DAILY     Cardiovascular: ARB + Diuretic Combos Failed - 06/29/2022  3:23 PM      Failed - Last BP in normal range    BP Readings from Last 1 Encounters:  06/29/22 (!) 150/80         Passed - K in normal range and within 180 days    Potassium  Date Value Ref Range Status  04/29/2022 3.5 3.5 - 5.1 mmol/L Final         Passed - Na in normal range and within 180 days    Sodium  Date Value Ref Range Status  04/29/2022 139 135 - 145 mmol/L Final  02/10/2022 143 134 - 144 mmol/L Final         Passed - Cr in normal range and within 180 days    Creatinine, Ser  Date Value Ref Range Status  04/29/2022 0.91 0.44 - 1.00 mg/dL Final         Passed - eGFR is 10 or above and within 180 days    GFR, Estimated  Date Value Ref Range Status  04/29/2022 >60 >60 mL/min Final    Comment:    (NOTE) Calculated using the CKD-EPI Creatinine Equation (2021)    eGFR  Date Value Ref Range Status  02/10/2022 63 >59 mL/min/1.73 Final         Passed - Patient is not pregnant      Passed - Valid encounter within last 6 months    Recent Outpatient Visits          Yesterday Diabetes mellitus without complication ([place])   [place] Kathrine Haddock, NP   1 month ago Chest pain, unspecified type   [facility] Kathrine Haddock, NP   4 months ago Diabetes mellitus without complication ([place])   [facility] Vigg, Avanti, MD   7 months ago Acute cough   [place], [place], NP   8 months ago Diabetes mellitus without complication [facility])   [facility] Vigg, Avanti, MD      Future Appointments            In 3 months Mecum, Dani Gobble, PA-C [employer], [facility]

## 2022-07-18 ENCOUNTER — Other Ambulatory Visit: Payer: Self-pay | Admitting: Family Medicine

## 2022-07-18 ENCOUNTER — Other Ambulatory Visit: Payer: Self-pay | Admitting: Internal Medicine

## 2022-07-18 DIAGNOSIS — E119 Type 2 diabetes mellitus without complications: Secondary | ICD-10-CM

## 2022-07-18 MED ORDER — HUMALOG MIX 75/25 KWIKPEN (75-25) 100 UNIT/ML ~~LOC~~ SUPN: PEN_INJECTOR | SUBCUTANEOUS | 3 refills | Status: DC

## 2022-07-18 NOTE — Telephone Encounter (Signed)
Requested Prescriptions  Pending Prescriptions Disp Refills  . HUMALOG MIX 75/25 KWIKPEN (75-25) 100 UNIT/ML KwikPen 15 mL 3    Sig: 18 units in the AM, 20 units in the PM     Endocrinology:  Diabetes - Insulins Passed - 07/18/2022 11:30 AM      Passed - HBA1C is between 0 and 7.9 and within 180 days    HB A1C (BAYER DCA - WAIVED)  Date Value Ref Range Status  02/07/2022 6.0 (H) 4.8 - 5.6 % Final    Comment:             Prediabetes: 5.7 - 6.4          Diabetes: >6.4          Glycemic control for adults with diabetes: <7.0          Passed - Valid encounter within last 6 months    Recent Outpatient Visits          2 weeks ago Diabetes mellitus without complication ([place])   [place] Kathrine Haddock, NP   2 months ago Chest pain, unspecified type   [facility] Kathrine Haddock, NP   5 months ago Diabetes mellitus without complication ([place])   [facility] Vigg, Avanti, MD   8 months ago Acute cough   [place], NP   8 months ago Diabetes mellitus without complication [facility])   [facility] Vigg, Avanti, MD      Future Appointments            In 2 months Mecum, Dani Gobble, PA-C [employer], [facility]

## 2022-07-18 NOTE — Telephone Encounter (Signed)
Medication Refill - Medication: HUMALOG MIX 75/25 KWIKPEN (75-25) 100 UNIT/ML KwikPen  Has the patient contacted their pharmacy? Yes.     Preferred Pharmacy (with phone number or street name):  [facility], [place] [facility] [address] Phone:  [phone]  Fax:  [phone]     Has the patient been seen for an appointment in the last year OR does the patient have an upcoming appointment? Yes.    Agent: Please be advised that RX refills may take up to 3 business days. We ask that you follow-up with your pharmacy.

## 2022-07-18 NOTE — Telephone Encounter (Signed)
Unable to refill per protocol, last refill by provider 07/18/22. Duplicate request.E-Prescribing Status: Receipt confirmed by pharmacy (07/18/2022 2:52 PM EDT). Will refuse.  Requested Prescriptions  Pending Prescriptions Disp Refills  . HUMALOG MIX 75/25 KWIKPEN (75-25) 100 UNIT/ML KwikPen [Pharmacy Med Name: HUMALOG MIX 75/25 KWIKPEN INJ 3ML] 15 mL 0    Sig: INJECT 18 UNITS UNDER THE SKIN EVERY MORNING AND 20 UNITS EVERY EVENING     Endocrinology:  Diabetes - Insulins Passed - 07/18/2022 11:07 AM      Passed - HBA1C is between 0 and 7.9 and within 180 days    HB A1C (BAYER DCA - WAIVED)  Date Value Ref Range Status  02/07/2022 6.0 (H) 4.8 - 5.6 % Final    Comment:             Prediabetes: 5.7 - 6.4          Diabetes: >6.4          Glycemic control for adults with diabetes: <7.0          Passed - Valid encounter within last 6 months    Recent Outpatient Visits          2 weeks ago Diabetes mellitus without complication ([place])   [place] Kathrine Haddock, NP   2 months ago Chest pain, unspecified type   [facility] Kathrine Haddock, NP   5 months ago Diabetes mellitus without complication ([place])   [facility] Vigg, Avanti, MD   8 months ago Acute cough   [place], NP   8 months ago Diabetes mellitus without complication [facility])   [facility] Vigg, Avanti, MD      Future Appointments            In 2 months Mecum, Dani Gobble, PA-C [employer]

## 2022-08-04 ENCOUNTER — Ambulatory Visit: Payer: Self-pay | Admitting: *Deleted

## 2022-08-04 ENCOUNTER — Ambulatory Visit
Admission: EM | Admit: 2022-08-04 | Discharge: 2022-08-04 | Disposition: A | Discharge status: Home / Self Care | Payer: Medicare Other

## 2022-08-04 DIAGNOSIS — R140 Abdominal distension (gaseous): Secondary | ICD-10-CM

## 2022-08-04 DIAGNOSIS — R109 Unspecified abdominal pain: Secondary | ICD-10-CM | POA: Diagnosis not present

## 2022-08-04 MED ORDER — CIPROFLOXACIN HCL 500 MG PO TABS: 500.0000 mg | ORAL_TABLET | Freq: Two times a day (BID) | ORAL | 0 refills | Status: AC

## 2022-08-04 MED ORDER — SIMETHICONE 80 MG PO CHEW: 80.0000 mg | CHEWABLE_TABLET | Freq: Four times a day (QID) | ORAL | 0 refills | Status: DC | PRN

## 2022-08-04 NOTE — Discharge Instructions (Addendum)
Abdominal gas and bloating can be caused by a number of different things including overproduction of gas in your intestines as a result of an intolerance of something in your diet such as lactose containing foods. There are several foods that are more less likely to cause abdominal gas. Read the provided information regarding low FODMAP diet.  It is also possible that your symptoms are a result of overgrowth of bacteria in your intestines (SIBO). This is treated with a course of antibiotics. I have prescribed this medication for you to try.     I recommend you contact your GI provider for a follow-up appointment if your symptoms continue or recur.  Recommend you avoid gas-producing foods in your diet. A medication called "simethicone" may help with acute symptoms. I'd recommend you ask for a follow-up appointment with the GI provider who saw you earlier in the year.

## 2022-08-04 NOTE — ED Provider Notes (Signed)
Vanessa Hogan    CSN: [id number] Arrival date & time: 08/04/22  1456      History   Chief Complaint Chief Complaint  Patient presents with   Abdominal Pain   Chest Pain    HPI Vanessa Hogan is a 81 y.o. female.   HPI  Presents to UC with complaint of worsening abdominal pain, gas, and bloating.  x1 week.  Patient endorses history of acid reflux treated at times with omeprazole, Pepcid, now pantoprazole per patient.  She was seen by GI for similar complaints on 01/30/2022 and provider concluded that her lower abdominal bloating and discomfort was possibly a result of the capsule containing omeprazole.  He recommended she continue Pepcid instead of omeprazole.  She says her symptoms are "all over".  Discomfort is worse when she eats.  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   Anxiety 06/29/2022   SOB (shortness of breath) on exertion 05/08/2022   Abdominal pain 02/06/2022   Acute gout of right hand 10/04/2021   Type 2 diabetes mellitus with hyperglycemia, with long-term current use of insulin (HCC) 09/13/2021   Dizziness 09/07/2021   Essential hypertension 08/25/2021   Foot pain, left 07/22/2021   Diabetes mellitus without complication (HCC) 07/21/2021   Gastroesophageal reflux disease 07/21/2021   Intermittent right-sided chest pain 2010    Past Surgical History:  Procedure Laterality Date   ABDOMINAL HYSTERECTOMY     BREAST SURGERY     HERNIA REPAIR     TUBAL LIGATION      OB History     Gravida  5   Para  4   Term  4   Preterm      AB  1   Living  4      SAB  1   IAB      Ectopic      Multiple      Live Births  4            Home  Medications    Prior to Admission medications   Medication Sig Start Date End Date Taking? Authorizing Provider  simethicone (MYLICON) 80 MG chewable tablet Chew 1 tablet (80 mg total) by mouth every 6 (six) hours as needed for flatulence. 08/04/22  Yes Kasha Howeth, Jeannett Senior, FNP  ACCU-CHEK GUIDE test strip 1 each by Other route 3 (three) times daily. 06/09/22   Olevia Perches P, DO  Accu-Chek Softclix Lancets lancets SMARTSIG:Topical 11/08/21   [provider]  allopurinol (ZYLOPRIM) 100 MG tablet TAKE 1 TABLET(100 MG) BY MOUTH DAILY 06/21/22   Johnson, Megan P, DO  clonazePAM (KLONOPIN) 0.5 MG tablet Take 1 tablet (0.5 mg total) by mouth daily as needed for anxiety. 06/29/22   Gabriel Cirri, NP  cloNIDine (CATAPRES) 0.2 MG tablet Take 1 tablet by mouth daily. 08/07/20   [provider]  fluticasone (FLONASE) 50 MCG/ACT nasal spray Place into both nostrils. 05/29/22   [provider]  GLOBAL EASE INJECT PEN NEEDLES 32G X 4 MM MISC SMARTSIG:1 needle SUB-Q 3 Times Daily 12/27/21   Vigg, Avanti, MD  HUMALOG MIX 75/25 KWIKPEN (75-25) 100 UNIT/ML KwikPen 18 units in the AM, 20 units in the  PM 07/18/22   Johnson, Megan P, DO  loratadine (CLARITIN) 10 MG tablet Take 10 mg by mouth daily as needed. 03/09/20   [provider]  LORazepam (ATIVAN) 0.5 MG tablet Take 1 tablet (0.5 mg total) by mouth as needed for anxiety. 09/07/21   Vigg, Avanti, MD  metFORMIN (GLUCOPHAGE-XR) 500 MG 24 hr tablet Take 500 mg by mouth daily as needed.    [provider]  omeprazole (PRILOSEC) 20 MG capsule Take 20 mg by mouth daily. 07/16/22   [provider]  OZEMPIC, 1 MG/DOSE, 4 MG/3ML SOPN Inject 1 mg into the skin once a week. Patient not taking: Reported on 06/29/2022 04/21/22   [provider]  pantoprazole (PROTONIX) 40 MG tablet Take 40 mg by mouth daily. 07/20/22   [provider]  rosuvastatin (CRESTOR) 10 MG tablet Take 1 tablet (10 mg total) by mouth  daily. Patient not taking: Reported on 05/09/2022 02/06/22   Loura Pardon, MD  valsartan-hydrochlorothiazide (DIOVAN-HCT) 160-12.5 MG tablet TAKE 1 TABLET BY MOUTH DAILY 06/30/22   Dorcas Carrow, DO    Family History Family History  Problem Relation Age of Onset   Stroke Mother    Stroke Father    Dementia Sister    Hypertension Brother    Diabetes Daughter    Hypertension Son     Social History Social History   Tobacco Use   Smoking status: Former    Packs/day: 0.25    Years: 5.00    Total pack years: 1.25    Types: Cigarettes    Quit date: 1972    Years since quitting: 51.8   Smokeless tobacco: Former  Building services engineer Use: Never used  Substance Use Topics   Alcohol use: Yes    Alcohol/week: 1.0 standard drink of alcohol    Types: 1 Glasses of wine per week    Comment: occasionally   Drug use: Never     Allergies   Asa [aspirin], Morphine and related, Nitroglycerin, Penicillins, and Sertraline   Review of Systems Review of Systems   Physical Exam Triage Vital Signs ED Triage Vitals  Enc Vitals Group     BP 08/04/22 1536 (!) 147/81     Pulse Rate 08/04/22 1536 76     Resp 08/04/22 1536 16     Temp 08/04/22 1536 97.7 F (36.5 C)     Temp src --      SpO2 08/04/22 1536 97 %     Weight --      Height --      Head Circumference --      Peak Flow --      Pain Score 08/04/22 1537 6     Pain Loc --      Pain Edu? --      Excl. in GC? --    No data found.  Updated Vital Signs BP (!) 147/81   Pulse 76   Temp 97.7 F (36.5 C)   Resp 16   SpO2 97%   Visual Acuity Right Eye Distance:   Left Eye Distance:   Bilateral Distance:    Right Eye Near:   Left Eye Near:    Bilateral Near:     Physical Exam   UC Treatments / Results  Labs (all labs ordered are listed, but only abnormal results are displayed) Labs Reviewed - No data to display  EKG   Radiology No results found.  Procedures Procedures (including critical care  time)  Medications  Ordered in UC Medications - No data to display  Initial Impression / Assessment and Plan / UC Course  I have reviewed the triage vital signs and the nursing notes.  Pertinent labs & imaging results that were available during my care of the patient were reviewed by me and considered in my medical decision making (see chart for details).   Reviewed the possible causes of production of abdominal gas and bloating including intolerance of foods such as lactose, diet with high FODMAP, small intestinal bacterial overgrowth.  She states she does not use or avoids lactose in her diet and there has not been any diet changes in the last few weeks.  She is treated for DM2 and has a "very strict diet".  Agreed to give her a prescription for Cipro 500 mg twice daily x10 days to attempt treatment of presumed SIBO and asked her to follow-up with an appointment with the GI provider who saw her recently.   Final Clinical Impressions(s) / UC Diagnoses   Final diagnoses:  Abdominal bloating with cramps     Discharge Instructions      Recommend you avoid gas-producing foods in your diet. A medication called "simethicone" may help with acute symptoms. I'd recommend you ask for a follow-up appointment with the GI provider who saw you earlier in the year.   ED Prescriptions     Medication Sig Dispense Auth. Provider   simethicone (MYLICON) 80 MG chewable tablet Chew 1 tablet (80 mg total) by mouth every 6 (six) hours as needed for flatulence. 30 tablet Claudina Oliphant, FNP      PDMP not reviewed this encounter.   Rose Phi, [place] 08/04/22 1622

## 2022-08-04 NOTE — ED Triage Notes (Signed)
Pt. Presents to UC w/ c/o abdominal pain for the past week. Pt. Has a hx of acid reflux, pt's medication regimen has recently changed for treatment of her reflux. Pt. Also c/o chest pressure that has been present for years.

## 2022-08-04 NOTE — Telephone Encounter (Signed)
  Chief Complaint: upper abdominal pain Symptoms: pain- moderate Frequency: 1 week Pertinent Negatives: Patient denies back pain, diarrhea, fever, urination pain, vomiting Disposition: [] ED /[x] Urgent Care (no appt availability in office) / [] Appointment(In office/virtual)/ []  [facility]/ [] Home Care/ [] Refused Recommended Disposition /[] [facility]/ []  Follow-up with PCP Additional Notes:     Reason for Disposition  [1] MILD-MODERATE pain AND [2] constant AND [3] present > 2 hours  Answer Assessment - Initial Assessment Questions 1. LOCATION: "Where does it hurt?"      Midline upper abdominal pain 2. RADIATION: "Does the pain shoot anywhere else?" (e.g., chest, back)     *No Answer* 3. ONSET: "When did the pain begin?" (e.g., minutes, hours or days ago)      1 week 4. SUDDEN: "Gradual or sudden onset?"     Gradual- in am 5. PATTERN "Does the pain come and go, or is it constant?"    - If it comes and goes: "How long does it last?" "Do you have pain now?"     (Note: Comes and goes means the pain is intermittent. It goes away completely between bouts.)    - If constant: "Is it getting better, staying the same, or getting worse?"      (Note: Constant means the pain never goes away completely; most serious pain is constant and gets worse.)      Last week- pain was constant 6. SEVERITY: "How bad is the pain?"  (e.g., Scale 1-10; mild, moderate, or severe)    - MILD (1-3): Doesn't interfere with normal activities, abdomen soft and not tender to touch.     - MODERATE (4-7): Interferes with normal activities or awakens from sleep, abdomen tender to touch.     - SEVERE (8-10): Excruciating pain, doubled over, unable to do any normal activities.       Severe- moderate 7. RECURRENT SYMPTOM: "Have you ever had this type of stomach pain before?" If Yes, ask: "When was the last time?" and "What happened that time?"      no 8. CAUSE: "What do you think is causing the stomach  pain?"     Unsure thought is was change in reflux medication 9. RELIEVING/AGGRAVATING FACTORS: "What makes it better or worse?" (e.g., antacids, bending or twisting motion, bowel movement)     BM helped 10. OTHER SYMPTOMS: "Do you have any other symptoms?" (e.g., back pain, diarrhea, fever, urination pain, vomiting)       Dizziness, nausea 11. PREGNANCY: "Is there any chance you are pregnant?" "When was your last menstrual period?"  Protocols used: Abdominal Pain - [facility]

## 2022-08-06 ENCOUNTER — Other Ambulatory Visit: Payer: Self-pay | Admitting: Family Medicine

## 2022-08-06 DIAGNOSIS — K219 Gastro-esophageal reflux disease without esophagitis: Secondary | ICD-10-CM

## 2022-08-06 DIAGNOSIS — E119 Type 2 diabetes mellitus without complications: Secondary | ICD-10-CM

## 2022-08-07 NOTE — Telephone Encounter (Signed)
Refilled 06/30/2022 #[id number] rf - confirmed by same pharmacy. Requested Prescriptions  Pending Prescriptions Disp Refills  . valsartan-hydrochlorothiazide (DIOVAN-HCT) 160-12.5 MG tablet [Pharmacy Med Name: VALSARTAN/HCTZ 160MG/12.5MG TABLETS] 90 tablet 2    Sig: TAKE 1 TABLET BY MOUTH DAILY     Cardiovascular: ARB + Diuretic Combos Failed - 08/06/2022 10:53 AM      Failed - Last BP in normal range    BP Readings from Last 1 Encounters:  08/04/22 (!) 147/81         Passed - K in normal range and within 180 days    Potassium  Date Value Ref Range Status  04/29/2022 3.5 3.5 - 5.1 mmol/L Final         Passed - Na in normal range and within 180 days    Sodium  Date Value Ref Range Status  04/29/2022 139 135 - 145 mmol/L Final  02/10/2022 143 134 - 144 mmol/L Final         Passed - Cr in normal range and within 180 days    Creatinine, Ser  Date Value Ref Range Status  04/29/2022 0.91 0.44 - 1.00 mg/dL Final         Passed - eGFR is 10 or above and within 180 days    GFR, Estimated  Date Value Ref Range Status  04/29/2022 >60 >60 mL/min Final    Comment:    (NOTE) Calculated using the CKD-EPI Creatinine Equation (2021)    eGFR  Date Value Ref Range Status  02/10/2022 63 >59 mL/min/1.73 Final         Passed - Patient is not pregnant      Passed - Valid encounter within last 6 months    Recent Outpatient Visits          1 month ago Diabetes mellitus without complication ([place])   [place] Kathrine Haddock, NP   3 months ago Chest pain, unspecified type   [facility] Kathrine Haddock, NP   6 months ago Diabetes mellitus without complication ([place])   [facility] Vigg, Avanti, MD   8 months ago Acute cough   [place], Santiago Glad, NP   9 months ago Diabetes mellitus without complication [facility])   [facility] Vigg, Avanti, MD      Future Appointments            Tomorrow Vanga, Tally Due, MD [place]  [facility] [place]   In 1 month Mecum, Dani Gobble, PA-C [employer]

## 2022-08-08 ENCOUNTER — Ambulatory Visit: Payer: Medicare Other | Admitting: Gastroenterology

## 2022-08-08 ENCOUNTER — Encounter: Payer: Self-pay | Admitting: Gastroenterology

## 2022-08-08 VITALS — BP 178/83 | HR 73 | Temp 98.2°F | Ht 65.5 in | Wt 214.4 lb

## 2022-08-08 DIAGNOSIS — R1030 Lower abdominal pain, unspecified: Secondary | ICD-10-CM

## 2022-08-08 DIAGNOSIS — R1031 Right lower quadrant pain: Secondary | ICD-10-CM

## 2022-08-08 DIAGNOSIS — R140 Abdominal distension (gaseous): Secondary | ICD-10-CM | POA: Diagnosis not present

## 2022-08-08 NOTE — Progress Notes (Signed)
Vanessa Repress, MD [address]  [address]  [place], [place] [address]  Main: [phone]  Fax: [phone]    Gastroenterology Consultation  Referring Provider:     Dorcas Carrow, DO Primary Care Physician:  Dorcas Carrow, DO Primary Gastroenterologist:  Dr. Arlyss Hogan Reason for Consultation:     Lower abdominal discomfort        HPI:   Vanessa Hogan is a 81 y.o. female referred by Dr. Laural Benes, Oralia Rud, DO  for consultation & management of lower abdominal discomfort.  Patient reports that she has been taking omeprazole 20 mg daily for at least 10 years for reflux.  She reports that about 2 weeks ago, she experienced lower abdominal discomfort which was vague and not associated with any aggravating or relieving factors.  She reports having regular bowel movements, 1-2 times daily, denies any rectal bleeding or abdominal bloating.  She was told that it might be indigestion and therefore her omeprazole was increased to 40 mg daily for last 2 weeks.  She currently does not have lower abdominal discomfort.  Patient denies any other GI symptoms.  Her labs reveal chronic microcytosis without evidence of anemia.  She reports having undergoing colonoscopy in [place] about 5 years ago.  She was told that she had polyps that were removed.  She does not know when she is due for next colonoscopy. Patient tends to develop lactose intolerance and tries to avoid milk products Patient does not smoke or drink alcohol She denies consumption of carbonated beverages She denies any food intolerance  Follow-up visit 01/30/22 Patient is here for follow-up of lower abdominal discomfort.  Patient noticed that she would experience severe lower abdominal bloating and discomfort after she switched to omeprazole from [facility] pharmacy.  Therefore, she started taking Pepcid and she is no longer experiencing GI symptoms.  She was taking omeprazole 20 mg capsules from her original pharmacy  without any lower GI issues.  She was also evaluated by OB/GYN for endometrial polyp and she deferred biopsy at this time  Follow-up visit 08/08/2022 Patient is here for follow-up of ongoing episodes of lower abdominal discomfort associated with severe abdominal bloating which radiates to the upper abdomen and feels a lot of pressure in her chest.  She went to urgent care, was given ciprofloxacin.  She reported significant improvement after first dose of Cipro, but her symptoms returned.  She reports having regular bowel movements, tries to eat healthy because of her diabetes.  She denies any weight loss, loss of appetite.  She denies any consumption of artificial sweeteners, carbonated beverages, sugary drinks.  NSAIDs: None  Antiplts/Anticoagulants/Anti thrombotics: None  GI Procedures: Reports undergoing colonoscopy about 5 years ago in [place], found to have polyps, report not available  Past Medical History:  Diagnosis Date   Anxiety    Depression    Diabetes mellitus without complication (HCC)    GERD (gastroesophageal reflux disease)    Gout    Hypertension     Past Surgical History:  Procedure Laterality Date   ABDOMINAL HYSTERECTOMY     BREAST SURGERY     HERNIA REPAIR     TUBAL LIGATION     Current Outpatient Medications:    ACCU-CHEK GUIDE test strip, 1 each by Other route 3 (three) times daily., Disp: 100 each, Rfl: 12   Accu-Chek Softclix Lancets lancets, SMARTSIG:Topical, Disp: , Rfl:    allopurinol (ZYLOPRIM) 100 MG tablet, TAKE 1 TABLET(100 MG) BY MOUTH DAILY,  Disp: 90 tablet, Rfl: 0   ciprofloxacin (CIPRO) 500 MG tablet, Take 1 tablet (500 mg total) by mouth 2 (two) times daily for 10 days., Disp: 20 tablet, Rfl: 0   clonazePAM (KLONOPIN) 0.5 MG tablet, Take 1 tablet (0.5 mg total) by mouth daily as needed for anxiety., Disp: 30 tablet, Rfl: 0   cloNIDine (CATAPRES) 0.2 MG tablet, Take 1 tablet by mouth daily., Disp: , Rfl:    fluticasone (FLONASE) 50 MCG/ACT  nasal spray, Place into both nostrils., Disp: , Rfl:    GLOBAL EASE INJECT PEN NEEDLES 32G X 4 MM MISC, SMARTSIG:1 needle SUB-Q 3 Times Daily, Disp: 100 each, Rfl: 11   HUMALOG MIX 75/25 KWIKPEN (75-25) 100 UNIT/ML KwikPen, 18 units in the AM, 20 units in the PM, Disp: 15 mL, Rfl: 3   loratadine (CLARITIN) 10 MG tablet, Take 10 mg by mouth daily as needed., Disp: , Rfl:    LORazepam (ATIVAN) 0.5 MG tablet, Take 1 tablet (0.5 mg total) by mouth as needed for anxiety., Disp: 20 tablet, Rfl: 0   metFORMIN (GLUCOPHAGE-XR) 500 MG 24 hr tablet, Take 500 mg by mouth daily as needed., Disp: , Rfl:    omeprazole (PRILOSEC) 20 MG capsule, Take 20 mg by mouth daily., Disp: , Rfl:    OZEMPIC, 1 MG/DOSE, 4 MG/3ML SOPN, Inject 1 mg into the skin once a week., Disp: , Rfl:    pantoprazole (PROTONIX) 40 MG tablet, Take 40 mg by mouth daily., Disp: , Rfl:    rosuvastatin (CRESTOR) 10 MG tablet, Take 1 tablet (10 mg total) by mouth daily., Disp: 90 tablet, Rfl: 3   valsartan-hydrochlorothiazide (DIOVAN-HCT) 160-12.5 MG tablet, TAKE 1 TABLET BY MOUTH DAILY, Disp: 90 tablet, Rfl: 2    Family History  Problem Relation Age of Onset   Stroke Mother    Stroke Father    Dementia Sister    Hypertension Brother    Diabetes Daughter    Hypertension Son      Social History   Tobacco Use   Smoking status: Former    Packs/day: 0.25    Years: 5.00    Total pack years: 1.25    Types: Cigarettes    Quit date: 1972    Years since quitting: 51.8   Smokeless tobacco: Former  Building services engineer Use: Never used  Substance Use Topics   Alcohol use: Yes    Alcohol/week: 1.0 standard drink of alcohol    Types: 1 Glasses of wine per week    Comment: occasionally   Drug use: Never    Allergies as of 08/08/2022 - Review Complete 08/08/2022  Allergen Reaction Noted   Asa [aspirin]  07/04/2021   Morphine and related  07/04/2021   Nitroglycerin Other (See Comments) 03/05/2020   Penicillins  07/04/2021    Sertraline Other (See Comments) 05/19/2020    Review of Systems:    All systems reviewed and negative except where noted in HPI.   Physical Exam:  BP (!) 178/83 (BP Location: Left Arm, Patient Position: Sitting, Cuff Size: Normal)   Pulse 73   Temp 98.2 F (36.8 C) (Oral)   Ht 5' 5.5" (1.664 m)   Wt 214 lb 6 oz (97.2 kg)   BMI 35.13 kg/m  No LMP recorded. Patient is postmenopausal.  General:   Alert,  Well-developed, well-nourished, pleasant and cooperative in NAD Head:  Normocephalic and atraumatic. Eyes:  Sclera clear, no icterus.   Conjunctiva pink. Ears:  Normal auditory acuity. Nose:  No deformity, discharge, or lesions. Mouth:  No deformity or lesions,oropharynx pink & moist. Neck:  Supple; no masses or thyromegaly. Lungs:  Respirations even and unlabored.  Clear throughout to auscultation.   No wheezes, crackles, or rhonchi. No acute distress. Heart:  Regular rate and rhythm; no murmurs, clicks, rubs, or gallops. Abdomen:  Normal bowel sounds. Soft, non-tender and mildly distended, tympanic to percussion without masses, hepatosplenomegaly or hernias noted.  No guarding or rebound tenderness.   Rectal: Not performed Msk:  Symmetrical without gross deformities. Good, equal movement & strength bilaterally. Pulses:  Normal pulses noted. Extremities:  No clubbing or edema.  No cyanosis. Neurologic:  Alert and oriented x3;  grossly normal neurologically. Skin:  Intact without significant lesions or rashes. No jaundice. Psych:  Alert and cooperative. Normal mood and affect.  Imaging Studies: Reviewed  Assessment and Plan:   Vanessa Hogan is a 81 y.o. female with metabolic syndrome is seen in consultation for recurrent episodes of lower abdominal discomfort stated with severe abdominal bloating.  Patient recently experienced another episode of lower abdominal bloating and discomfort, went to urgent care on 08/04/2022, was given Cipro and simethicone.  Patient is  concerned about recurrent episodes.  Therefore, recommend CT abdomen pelvis with contrast to evaluate for any intra-abdominal pathology.  If this is negative, recommend EGD and colonoscopy with possible TI evaluation  Follow up as needed   Cephas Darby, MD

## 2022-08-08 NOTE — Patient Instructions (Addendum)
Your CT scan is schedule for 08/22/2022 at 11:30am. at medical mall at [facility] Please arrive 11:15am for the exam and nothing to eat or drink 4 hours before the scan. If you need to reschedule call [phone] option 3 and option 2

## 2022-08-15 ENCOUNTER — Ambulatory Visit (LOCAL_COMMUNITY_HEALTH_CENTER): Payer: Medicare Other

## 2022-08-15 DIAGNOSIS — Z23 Encounter for immunization: Secondary | ICD-10-CM | POA: Diagnosis not present

## 2022-08-15 DIAGNOSIS — Z719 Counseling, unspecified: Secondary | ICD-10-CM

## 2022-08-15 NOTE — Progress Notes (Signed)
  Are you feeling sick today? No   Have you ever received a dose of COVID-19 Vaccine? [employer], [place], [place], [place], Other) Yes  If yes, which vaccine and how many doses?   [place], 4   Did you bring the vaccination record card or other documentation?  Yes   Do you have a health condition or are undergoing treatment that makes you moderately or severely immunocompromised? This would include, but not be limited to: cancer, HIV, organ transplant, immunosuppressive therapy/high-dose corticosteroids, or moderate/severe primary immunodeficiency.  No  Have you received COVID-19 vaccine before or during hematopoietic cell transplant (HCT) or CAR-T-cell therapies? No  Have you ever had an allergic reaction to: (This would include a severe allergic reaction or a reaction that caused hives, swelling, or respiratory distress, including wheezing.) A component of a COVID-19 vaccine or a previous dose of COVID-19 vaccine? Yes (PENICILLIN caused HIVES )   Have you ever had an allergic reaction to another vaccine (other thanCOVID-19 vaccine) or an injectable medication? (This would include a severe allergic reaction or a reaction that caused hives, swelling, or respiratory distress, including wheezing.)   No    Do you have a history of any of the following:  Myocarditis or Pericarditis No  Dermal fillers:  No  Multisystem Inflammatory Syndrome (MIS-C or MIS-A)? No  COVID-19 disease within the past 3 months? No  Vaccinated with monkeypox vaccine in the last 4 weeks? No  Eligible and administered Moderna SpikeVax, monitored, tolerated well. Verbalized understanding of VIS and NCIR copy. M.Katasha Riga, LPN.

## 2022-08-18 ENCOUNTER — Telehealth: Payer: Self-pay | Admitting: Gastroenterology

## 2022-08-18 ENCOUNTER — Encounter: Payer: Self-pay | Admitting: Nurse Practitioner

## 2022-08-18 ENCOUNTER — Ambulatory Visit: Payer: Self-pay

## 2022-08-18 ENCOUNTER — Ambulatory Visit (INDEPENDENT_AMBULATORY_CARE_PROVIDER_SITE_OTHER): Payer: Medicare Other | Admitting: Nurse Practitioner

## 2022-08-18 DIAGNOSIS — R1033 Periumbilical pain: Secondary | ICD-10-CM | POA: Diagnosis not present

## 2022-08-18 MED ORDER — RIFAXIMIN 550 MG PO TABS: 550.0000 mg | ORAL_TABLET | Freq: Three times a day (TID) | ORAL | 0 refills | Status: DC

## 2022-08-18 NOTE — Telephone Encounter (Signed)
Patient verbalized understanding of instructions  

## 2022-08-18 NOTE — Progress Notes (Signed)
BP 121/78   Pulse 83   Temp 98.3 F (36.8 C) (Oral)   Ht 5' 5.5" (1.664 m)   Wt 216 lb 9.6 oz (98.2 kg)   SpO2 95%   BMI 35.50 kg/m    Subjective:    Patient ID: Vanessa Hogan, female    DOB: 1941/06/17, 81 y.o.   MRN: [id number]  HPI: Vanessa Hogan is a 81 y.o. female  Chief Complaint  Patient presents with   Abdominal Pain    Patient says she is experiencing stomach pain. Patient says she was told she has bacterial in her intestines in the past. Patient was last seen by [facility] on 08/08/22. Patient says she is requesting a refill on the Cipro (Keflex) and patient took her last dose 3 days ago. Patient was told she would have to be seen by her PCP office in order to have refill.    ABDOMINAL PAIN  Ongoing abdominal pain -- GI follows -- as they would not prescribe same medication (they sent something else), but insurance did not receive authorization for this and when she tried to get GI authorization today the office was closed for afternoon -- presents today to get our assist with sending this in.  Order for was Xifaxan.    Last treatment for pain 08/04/22 with Cipro and simethicone at urgent care.  On review recent GI note on 08/08/22 they recommended CT abd/pelvis (Tuesday will be performed) with contrast due to ongoing issues and possible EGD and colonoscopy.  Non smoker and no alcohol use.  Does still have gall bladder. Duration:months Onset: gradual Severity: 6/10 Quality: gripping pain, like someone squeezing stomach Location:  peri-umbilical  Episode duration: before treatment all day, now more periodic Radiation:  upwards Frequency: intermittent Alleviating factors: Cipro and Simethicone Aggravating factors: nothing Status: fluctuating Treatments attempted: Cipro and Simethicone Fever: no Nausea: yes Vomiting: no Weight loss: no Decreased appetite:  when pain is intense Diarrhea: no Constipation: no Blood in stool: no Heartburn:  no Jaundice: no Rash: no Dysuria/urinary frequency: no Hematuria: no History of sexually transmitted disease: no Recurrent NSAID use: no   Relevant past medical, surgical, family and social history reviewed and updated as indicated. Interim medical history since our last visit reviewed. Allergies and medications reviewed and updated.  Review of Systems  Constitutional:  Negative for activity change, appetite change, diaphoresis, fatigue and fever.  Respiratory:  Negative for cough, chest tightness and shortness of breath.   Cardiovascular:  Negative for chest pain, palpitations and leg swelling.  Gastrointestinal:  Positive for abdominal pain and nausea. Negative for abdominal distention, constipation, diarrhea and vomiting.  Neurological: Negative.   Psychiatric/Behavioral: Negative.      Per HPI unless specifically indicated above     Objective:    BP 121/78   Pulse 83   Temp 98.3 F (36.8 C) (Oral)   Ht 5' 5.5" (1.664 m)   Wt 216 lb 9.6 oz (98.2 kg)   SpO2 95%   BMI 35.50 kg/m   Wt Readings from Last 3 Encounters:  08/18/22 216 lb 9.6 oz (98.2 kg)  08/08/22 214 lb 6 oz (97.2 kg)  06/29/22 218 lb 14.4 oz (99.3 kg)    Physical Exam Vitals and nursing note reviewed.  Constitutional:      General: She is awake. She is not in acute distress.    Appearance: She is well-developed and well-groomed. She is obese. She is not ill-appearing or toxic-appearing.  HENT:  Head: Normocephalic.     Right Ear: Hearing normal.     Left Ear: Hearing normal.  Eyes:     General: Lids are normal.        Right eye: No discharge.        Left eye: No discharge.     Conjunctiva/sclera: Conjunctivae normal.     Pupils: Pupils are equal, round, and reactive to light.  Neck:     Thyroid: No thyromegaly.     Vascular: No carotid bruit.  Cardiovascular:     Rate and Rhythm: Normal rate and regular rhythm.     Heart sounds: Normal heart sounds. No murmur heard.    No gallop.   Pulmonary:     Effort: Pulmonary effort is normal.     Breath sounds: Normal breath sounds.  Abdominal:     General: Bowel sounds are normal. There is no distension.     Palpations: Abdomen is soft. There is no hepatomegaly.     Tenderness: There is no abdominal tenderness. There is no right CVA tenderness or left CVA tenderness.  Musculoskeletal:     Cervical back: Normal range of motion and neck supple.     Right lower leg: No edema.     Left lower leg: No edema.  Skin:    General: Skin is warm and dry.  Neurological:     Mental Status: She is alert and oriented to person, place, and time.  Psychiatric:        Attention and Perception: Attention normal.        Mood and Affect: Mood normal.        Speech: Speech normal.        Behavior: Behavior normal. Behavior is cooperative.        Thought Content: Thought content normal.    Results for orders placed or performed in visit on 06/07/22  HM DIABETES EYE EXAM  Result Value Ref Range   HM Diabetic Eye Exam No Retinopathy No Retinopathy      Assessment & Plan:   Problem List Items Addressed This Visit       Other   Abdominal pain    Ongoing and followed by GI.  Will send in Xifaxan as ordered by GI to pharmacy -- script sent.  She is to alert office if any issues obtaining.  Continue collaboration with GI.  Recent notes reviewed.  She is scheduled for upcoming CT scan.          Follow up plan: Return if symptoms worsen or fail to improve.

## 2022-08-18 NOTE — Telephone Encounter (Signed)
Patient is still having RLQ pain that radiates to the center of stomach. Patient has had nausea but no vomiting. Denies any constipation or diarrhea.  She states when she was taking the Cipro she had relief of the abdominal pain. She has been off the medication for 3 days and this morning she woke up with the pain again. Patient is requesting the medication to be refilled please advise

## 2022-08-18 NOTE — Telephone Encounter (Signed)
  Chief Complaint: Abdominal pain Symptoms: abdominal pain and bloating Frequency: since 10/13, also seen in 01/2022 for same Pertinent Negatives: Patient denies  Disposition: [] ED /[] Urgent Care (no appt availability in office) / [] Appointment(In office/virtual)/ []  [facility]/ [] Home Care/ [] Refused Recommended Disposition /[] [facility]/ [x]  Follow-up with specialist Additional Notes: Pt has a history of abdominal pain that was originally evaluated in 01/2022. Pt was seen in Ed on 08/04/2022 for same s/s. PT was given cipro and simethicone and referred to GI. Cipro seemed to help. PT went to Dr. Marius Ditch, per pt Dr. Marius Ditch did not help to control pt's pain. Pt is scheduled for CT abdomen on 10/31. Pt called today as pain has returned. PT will call Dr. Verlin Grills office, and will call back to [place] if needed.   Called pt back and recommended  ED.   Reason for Disposition  [1] SEVERE pain AND [2] age > 60 years  Answer Assessment - Initial Assessment Questions 1. LOCATION: "Where does it hurt?"      Abdomen 2. RADIATION: "Does the pain shoot anywhere else?" (e.g., chest, back)      3. ONSET: "When did the pain begin?" (e.g., minutes, hours or days ago)       4. SUDDEN: "Gradual or sudden onset?"      5. PATTERN "Does the pain come and go, or is it constant?"    - If it comes and goes: "How long does it last?" "Do you have pain now?"     (Note: Comes and goes means the pain is intermittent. It goes away completely between bouts.)    - If constant: "Is it getting better, staying the same, or getting worse?"      (Note: Constant means the pain never goes away completely; most serious pain is constant and gets worse.)       6. SEVERITY: "How bad is the pain?"  (e.g., Scale 1-10; mild, moderate, or severe)    - MILD (1-3): Doesn't interfere with normal activities, abdomen soft and not tender to touch.     - MODERATE (4-7): Interferes with normal activities or awakens from sleep,  abdomen tender to touch.     - SEVERE (8-10): Excruciating pain, doubled over, unable to do any normal activities.       Moderate to severe 7. RECURRENT SYMPTOM: "Have you ever had this type of stomach pain before?" If Yes, ask: "When was the last time?" and "What happened that time?"      Yes - cipro 8. CAUSE: "What do you think is causing the stomach pain?"     unsure 9. RELIEVING/AGGRAVATING FACTORS: "What makes it better or worse?" (e.g., antacids, bending or twisting motion, bowel movement)      10. OTHER SYMPTOMS: "Do you have any other symptoms?" (e.g., back pain, diarrhea, fever, urination pain, vomiting)        11. PREGNANCY: "Is there any chance you are pregnant?" "When was your last menstrual period?"  Protocols used: Abdominal Pain - [facility]

## 2022-08-18 NOTE — Patient Instructions (Signed)
Abdominal Pain, Adult Many things can cause belly (abdominal) pain. Most times, belly pain is not dangerous. Many cases of belly pain can be watched and treated at home. Sometimes, though, belly pain is serious. Your doctor will try to find the cause of your belly pain. Follow these instructions at home:  Medicines Take over-the-counter and prescription medicines only as told by your doctor. Do not take medicines that help you poop (laxatives) unless told by your doctor. General instructions Watch your belly pain for any changes. Drink enough fluid to keep your pee (urine) pale yellow. Keep all follow-up visits as told by your doctor. This is important. Contact a doctor if: Your belly pain changes or gets worse. You are not hungry, or you lose weight without trying. You are having trouble pooping (constipated) or have watery poop (diarrhea) for more than 2-3 days. You have pain when you pee or poop. Your belly pain wakes you up at night. Your pain gets worse with meals, after eating, or with certain foods. You are vomiting and cannot keep anything down. You have a fever. You have blood in your pee. Get help right away if: Your pain does not go away as soon as your doctor says it should. You cannot stop vomiting. Your pain is only in areas of your belly, such as the right side or the left lower part of the belly. You have bloody or black poop, or poop that looks like tar. You have very bad pain, cramping, or bloating in your belly. You have signs of not having enough fluid or water in your body (dehydration), such as: Dark pee, very little pee, or no pee. Cracked lips. Dry mouth. Sunken eyes. Sleepiness. Weakness. You have trouble breathing or chest pain. Summary Many cases of belly pain can be watched and treated at home. Watch your belly pain for any changes. Take over-the-counter and prescription medicines only as told by your doctor. Contact a doctor if your belly pain  changes or gets worse. Get help right away if you have very bad pain, cramping, or bloating in your belly. This information is not intended to replace advice given to you by your health care provider. Make sure you discuss any questions you have with your health care provider. Document Revised: 02/17/2019 Document Reviewed: 02/17/2019 Elsevier Patient Education  2023 Elsevier Inc.  

## 2022-08-18 NOTE — Addendum Note (Signed)
Addended by: Ulyess Blossom L on: 08/18/2022 10:58 AM   Modules accepted: Orders

## 2022-08-18 NOTE — Assessment & Plan Note (Signed)
Ongoing and followed by GI.  Will send in Xifaxan as ordered by GI to pharmacy -- script sent.  She is to alert office if any issues obtaining.  Continue collaboration with GI.  Recent notes reviewed.  She is scheduled for upcoming CT scan.

## 2022-08-18 NOTE — Telephone Encounter (Signed)
Pt left message with questions regarding cipro and still have severe stomach pain

## 2022-08-18 NOTE — Telephone Encounter (Signed)
She was given Cipro for 10 days on 08/04/2022. Was finished with the medication on 08/14/2022

## 2022-08-22 ENCOUNTER — Ambulatory Visit
Admission: RE | Admit: 2022-08-22 | Discharge: 2022-08-22 | Disposition: A | Discharge status: Home / Self Care | Payer: Medicare Other | Source: Ambulatory Visit | Attending: Gastroenterology | Admitting: Gastroenterology

## 2022-08-22 DIAGNOSIS — R140 Abdominal distension (gaseous): Secondary | ICD-10-CM | POA: Diagnosis present

## 2022-08-22 DIAGNOSIS — R1030 Lower abdominal pain, unspecified: Secondary | ICD-10-CM | POA: Insufficient documentation

## 2022-08-22 DIAGNOSIS — R1031 Right lower quadrant pain: Secondary | ICD-10-CM | POA: Insufficient documentation

## 2022-08-22 LAB — POCT I-STAT CREATININE: Creatinine, Ser: 0.8 mg/dL (ref 0.44–1.00)

## 2022-08-22 MED ORDER — IOHEXOL 300 MG/ML  SOLN
100.0000 mL | Freq: Once | INTRAMUSCULAR | Status: AC | PRN
  Administered 2022-08-22: 100 mL via INTRAVENOUS

## 2022-08-23 ENCOUNTER — Telehealth: Payer: Self-pay

## 2022-08-23 ENCOUNTER — Ambulatory Visit: Payer: Medicare Other | Admitting: Physical Therapy

## 2022-08-23 MED ORDER — METRONIDAZOLE 500 MG PO TABS: 500.0000 mg | ORAL_TABLET | Freq: Two times a day (BID) | ORAL | 0 refills | Status: DC

## 2022-08-23 NOTE — Telephone Encounter (Signed)
Try Flagyl 500 mg twice daily for 10 days. I am waiting on the CT scan results  RV

## 2022-08-23 NOTE — Telephone Encounter (Signed)
Patient verbalized understanding of instructions. Sent medication to the pharmacy  

## 2022-08-23 NOTE — Telephone Encounter (Signed)
Called and did the PA on the phone for Xifaxan 550mg . They approved the medication Josem Kaufmann number is PA- [phone] good from 08/23/2022 to 09/06/2022 . Called and informed patient of this information and she will get the medication at the pharmacy

## 2022-08-23 NOTE — Addendum Note (Signed)
Addended by: Ulyess Blossom L on: 08/23/2022 11:29 AM   Modules accepted: Orders

## 2022-08-23 NOTE — Telephone Encounter (Signed)
Patient is stating the Xifaxan is 600 dollars and she can not afford this medication. She is wanting to know if there is any other options

## 2022-08-24 ENCOUNTER — Telehealth: Payer: Self-pay

## 2022-08-24 NOTE — Telephone Encounter (Signed)
Prior Authorization was initiated via CoverMyMeds for prescription Xifaxan 550 MG tablets. Awaiting on determination from patient's insurance. Delay in initiating PA due to CoverMyMeds being down system wide.   KEY: [id number]

## 2022-08-25 ENCOUNTER — Other Ambulatory Visit: Payer: Self-pay

## 2022-08-25 ENCOUNTER — Telehealth: Payer: Self-pay

## 2022-08-25 DIAGNOSIS — R1030 Lower abdominal pain, unspecified: Secondary | ICD-10-CM

## 2022-08-25 DIAGNOSIS — R140 Abdominal distension (gaseous): Secondary | ICD-10-CM

## 2022-08-25 MED ORDER — NA SULFATE-K SULFATE-MG SULF 17.5-3.13-1.6 GM/177ML PO SOLN: 354.0000 mL | Freq: Once | ORAL | 0 refills | Status: AC

## 2022-08-25 NOTE — Telephone Encounter (Signed)
-----   Message from [name], MD sent at 08/24/2022  6:01 PM EDT ----- Please inform patient that CT abdomen and pelvis revealed colonic diverticulosis only with no evidence of any other acute findings to explain her abdominal pain.  If patient is agreeable, as previously discussed during office visit, I recommend upper endoscopy as well as colonoscopy for further evaluation  Vanessa Hogan

## 2022-08-25 NOTE — Telephone Encounter (Signed)
Patient also states she is not taking the metformin

## 2022-08-25 NOTE — Telephone Encounter (Signed)
Patient states the Flagyl did not help her abdominal pain but made the pain worse. She states she is on a fix income and can not afford to do anything else this month. Informed patient she does not have to pay anything at the time of her procedure she can just get bill for the procedure she said okay. I told her with her pain we need to get this schedule. Sent prep to the pharmacy informed patient if prep was expensive could send in a different prep. Told her I would give her the instructions in hand out for the other prep also. Went over instructions and mailed them to the patient

## 2022-08-28 ENCOUNTER — Telehealth: Payer: Self-pay | Admitting: Gastroenterology

## 2022-08-28 ENCOUNTER — Encounter: Payer: Medicare Other | Admitting: Physical Therapy

## 2022-08-28 NOTE — Telephone Encounter (Signed)
Patient notified that her message has been received to cancel her colonoscopy with Dr. Marius Ditch.  Procedure has been canceled with Trish in Endo.  Thanks,  Sharyn Lull CMA

## 2022-08-28 NOTE — Telephone Encounter (Signed)
Patient called to cancel procedure on 09/13/22 Will call back later to resched

## 2022-08-30 ENCOUNTER — Encounter: Payer: Medicare Other | Admitting: Physical Therapy

## 2022-09-04 ENCOUNTER — Encounter: Payer: Medicare Other | Admitting: Physical Therapy

## 2022-09-06 ENCOUNTER — Encounter: Payer: Medicare Other | Admitting: Physical Therapy

## 2022-09-13 ENCOUNTER — Ambulatory Visit: Admit: 2022-09-13 | Payer: Medicare Other | Admitting: Gastroenterology

## 2022-09-13 ENCOUNTER — Encounter: Payer: Medicare Other | Admitting: Physical Therapy

## 2022-09-13 SURGERY — COLONOSCOPY WITH PROPOFOL
Anesthesia: General

## 2022-09-17 ENCOUNTER — Other Ambulatory Visit: Payer: Self-pay | Admitting: Family Medicine

## 2022-09-18 ENCOUNTER — Encounter: Payer: Medicare Other | Admitting: Physical Therapy

## 2022-09-19 NOTE — Telephone Encounter (Signed)
Requested Prescriptions  Pending Prescriptions Disp Refills   allopurinol (ZYLOPRIM) 100 MG tablet [Pharmacy Med Name: ALLOPURINOL 100MG  TABLETS] 90 tablet 0    Sig: TAKE 1 TABLET(100 MG) BY MOUTH DAILY     Endocrinology:  Gout Agents - allopurinol Passed - 09/17/2022 10:18 AM      Passed - Uric Acid in normal range and within 360 days    Uric Acid  Date Value Ref Range Status  12/28/2021 6.2 3.1 - 7.9 mg/dL Final    Comment:               Therapeutic target for gout patients: <6.0         Passed - Cr in normal range and within 360 days    Creatinine, Ser  Date Value Ref Range Status  08/22/2022 0.80 0.44 - 1.00 mg/dL Final         Passed - Valid encounter within last 12 months    Recent Outpatient Visits           1 month ago Periumbilical abdominal pain   [facility] Rincon, Dobbs ferry T, NP   2 months ago Diabetes mellitus without complication (HCC)   [facility] Corrie Dandy, NP   4 months ago Chest pain, unspecified type   [facility], NP   7 months ago Diabetes mellitus without complication (HCC)   [facility] Vigg, Avanti, MD   10 months ago Acute cough   [facility], NP       Future Appointments             In 1 week Mecum, Larae Grooms, PA-C [facility], [facility]            Passed - CBC within normal limits and completed in the last 12 months    WBC  Date Value Ref Range Status  04/29/2022 5.3 4.0 - 10.5 K/uL Final   RBC  Date Value Ref Range Status  04/29/2022 6.07 (H) 3.87 - 5.11 MIL/uL Final   Hemoglobin  Date Value Ref Range Status  04/29/2022 14.0 12.0 - 15.0 g/dL Final  06/30/2022 63/87/5643 11.1 - 15.9 g/dL Final   HCT  Date Value Ref Range Status  04/29/2022 45.7 36.0 - 46.0 % Final   Hematocrit  Date Value Ref Range Status  02/10/2022 47.1 (H) 34.0 - 46.6 % Final   MCHC  Date Value Ref Range Status  04/29/2022 30.6 30.0 - 36.0 g/dL Final    [facility]  Date Value Ref Range Status  04/29/2022 23.1 (L) 26.0 - 34.0 pg Final   MCV  Date Value Ref Range Status  04/29/2022 75.3 (L) 80.0 - 100.0 fL Final  02/10/2022 77 (L) 79 - 97 fL Final   No results found for: "PLTCOUNTKUC", "LABPLAT", "POCPLA" RDW  Date Value Ref Range Status  04/29/2022 17.7 (H) 11.5 - 15.5 % Final  02/10/2022 17.4 (H) 11.7 - 15.4 % Final         '

## 2022-09-20 ENCOUNTER — Encounter: Payer: Medicare Other | Admitting: Physical Therapy

## 2022-09-28 ENCOUNTER — Ambulatory Visit (INDEPENDENT_AMBULATORY_CARE_PROVIDER_SITE_OTHER): Payer: Medicare Other | Admitting: Physician Assistant

## 2022-09-28 ENCOUNTER — Encounter: Payer: Self-pay | Admitting: Physician Assistant

## 2022-09-28 VITALS — BP 162/86 | HR 83 | Temp 97.9°F | Wt 209.9 lb

## 2022-09-28 DIAGNOSIS — Z23 Encounter for immunization: Secondary | ICD-10-CM | POA: Diagnosis not present

## 2022-09-28 DIAGNOSIS — E1169 Type 2 diabetes mellitus with other specified complication: Secondary | ICD-10-CM | POA: Diagnosis not present

## 2022-09-28 DIAGNOSIS — F419 Anxiety disorder, unspecified: Secondary | ICD-10-CM | POA: Diagnosis not present

## 2022-09-28 DIAGNOSIS — F515 Nightmare disorder: Secondary | ICD-10-CM

## 2022-09-28 DIAGNOSIS — I10 Essential (primary) hypertension: Secondary | ICD-10-CM | POA: Diagnosis not present

## 2022-09-28 DIAGNOSIS — E1165 Type 2 diabetes mellitus with hyperglycemia: Secondary | ICD-10-CM

## 2022-09-28 DIAGNOSIS — E785 Hyperlipidemia, unspecified: Secondary | ICD-10-CM

## 2022-09-28 DIAGNOSIS — Z794 Long term (current) use of insulin: Secondary | ICD-10-CM

## 2022-09-28 MED ORDER — LORAZEPAM 0.5 MG PO TABS: 0.5000 mg | ORAL_TABLET | ORAL | 0 refills | Status: DC | PRN

## 2022-09-28 NOTE — Patient Instructions (Addendum)
Please come back in 1-2 weeks to complete your lab work while fasting
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
## 2022-10-02 DIAGNOSIS — E1169 Type 2 diabetes mellitus with other specified complication: Secondary | ICD-10-CM | POA: Insufficient documentation

## 2022-10-02 NOTE — Assessment & Plan Note (Signed)
Chronic per results review Most recent cholesterol panel from March 2023 demonstrates elevated triglycerides, LDL and total cholesterol levels  She reports today that she has not been taking her Rosuvastatin  Attempted to provide education on MOA and importance of statins in managing cholesterol but patient replied "I've had high cholesterol since my 20s and nothing has happened. They've been telling me to take these since then but...."  Recommend continued efforts to discuss cholesterol medications with her at future visits Cholesterol panel ordered today for monitoring - results to dictate further management Follow up in 3 months

## 2022-10-02 NOTE — Assessment & Plan Note (Signed)
Chronic, ongoing condition  Most recent A1c was 6.0 in April 2023  Recheck today with labs- results to dictate further management She reports she is taking Humalog only right now due to prohibitive cost of Ozempic and is not taking Metformin at all She is checking glucose BID with fasting levels typically in 100s-130s PNA vaccine administered today Follow up in 3 months for monitoring and medication management

## 2022-10-02 NOTE — Assessment & Plan Note (Addendum)
Chronic, ongoing She is not checking her BP at home regularly and states it is elevated today due to driving  She is currently taking Valsartan-HCTZ 160-12.5 mg PO QD and Clonidine 0.2 mg PO QD- appears to be tolerating well She expressed significant frustrations with changing her medication regimens in the past and feeling like most were ineffective so I do not believe it would be beneficial to try to change today Recommend that she checks her BP at home daily and reports back to office with results to see what her BP is doing at home Recommend she continue with current regimen Follow up in 3 months for monitoring

## 2022-10-02 NOTE — Assessment & Plan Note (Signed)
Chronic, ongoing  Reports she is not interested in taking controller medication at this time and she did not like Clonazepam due to side effects She states she would prefer to return to Lorazepam PRN at this time- we reviewed safety concerns for using these medications for older patients, side effects and appropriate use She is amenable to 20 quantity as she states she does not use frequently Suspect she would likely benefit from therapy services and appropriate controller medication if she was amenable to these Follow up in 3 months for monitoring

## 2022-10-05 ENCOUNTER — Emergency Department
Admission: EM | Admit: 2022-10-05 | Discharge: 2022-10-05 | Disposition: A | Discharge status: Home / Self Care | Payer: Medicare Other | Attending: Student in an Organized Health Care Education/Training Program | Admitting: Student in an Organized Health Care Education/Training Program

## 2022-10-05 ENCOUNTER — Emergency Department: Payer: Medicare Other

## 2022-10-05 ENCOUNTER — Other Ambulatory Visit: Payer: Self-pay

## 2022-10-05 DIAGNOSIS — U071 COVID-19: Secondary | ICD-10-CM | POA: Diagnosis not present

## 2022-10-05 DIAGNOSIS — R0602 Shortness of breath: Secondary | ICD-10-CM | POA: Diagnosis present

## 2022-10-05 DIAGNOSIS — R1084 Generalized abdominal pain: Secondary | ICD-10-CM

## 2022-10-05 LAB — URINALYSIS, ROUTINE W REFLEX MICROSCOPIC
Bacteria, UA: NONE SEEN
Bilirubin Urine: NEGATIVE
Glucose, UA: NEGATIVE mg/dL
Hgb urine dipstick: NEGATIVE
Ketones, ur: NEGATIVE mg/dL
Nitrite: NEGATIVE
Protein, ur: NEGATIVE mg/dL
Specific Gravity, Urine: 1.005 (ref 1.005–1.030)
pH: 7 (ref 5.0–8.0)

## 2022-10-05 LAB — COMPREHENSIVE METABOLIC PANEL
ALT: 17 U/L (ref 0–44)
AST: 24 U/L (ref 15–41)
Albumin: 3.8 g/dL (ref 3.5–5.0)
Alkaline Phosphatase: 53 U/L (ref 38–126)
Anion gap: 6 (ref 5–15)
BUN: 11 mg/dL (ref 8–23)
CO2: 26 mmol/L (ref 22–32)
Calcium: 8.9 mg/dL (ref 8.9–10.3)
Chloride: 105 mmol/L (ref 98–111)
Creatinine, Ser: 0.86 mg/dL (ref 0.44–1.00)
GFR, Estimated: >60 mL/min (ref >60)
Glucose, Bld: 119 mg/dL — ABNORMAL HIGH (ref 70–99)
Potassium: 3.5 mmol/L (ref 3.5–5.1)
Sodium: 137 mmol/L (ref 135–145)
Total Bilirubin: 0.7 mg/dL (ref 0.3–1.2)
Total Protein: 7.5 g/dL (ref 6.5–8.1)

## 2022-10-05 LAB — RESP PANEL BY RT-PCR (RSV, FLU A&B, COVID)  RVPGX2
Influenza A by PCR: NEGATIVE
Influenza B by PCR: NEGATIVE
Resp Syncytial Virus by PCR: NEGATIVE
SARS Coronavirus 2 by RT PCR: POSITIVE — AB

## 2022-10-05 LAB — CBC
HCT: 47.3 % — ABNORMAL HIGH (ref 36.0–46.0)
Hemoglobin: 14.8 g/dL (ref 12.0–15.0)
MCH: 23.9 pg — ABNORMAL LOW (ref 26.0–34.0)
MCHC: 31.3 g/dL (ref 30.0–36.0)
MCV: 76.3 fL — ABNORMAL LOW (ref 80.0–100.0)
Platelets: 234 10*3/uL (ref 150–400)
RBC: 6.2 MIL/uL — ABNORMAL HIGH (ref 3.87–5.11)
RDW: 16.7 % — ABNORMAL HIGH (ref 11.5–15.5)
WBC: 3.9 10*3/uL — ABNORMAL LOW (ref 4.0–10.5)
nRBC: 0 % (ref 0.0–0.2)

## 2022-10-05 LAB — LIPASE, BLOOD: Lipase: 33 U/L (ref 11–51)

## 2022-10-05 LAB — TROPONIN I (HIGH SENSITIVITY): Troponin I (High Sensitivity): 6 ng/L (ref <18)

## 2022-10-05 MED ORDER — NIRMATRELVIR/RITONAVIR (PAXLOVID)TABLET: 3.0000 | ORAL_TABLET | Freq: Two times a day (BID) | ORAL | 0 refills | Status: AC

## 2022-10-05 MED ORDER — SODIUM CHLORIDE 0.9 % IV BOLUS
500.0000 mL | Freq: Once | INTRAVENOUS | Status: AC
  Administered 2022-10-05: 500 mL via INTRAVENOUS

## 2022-10-05 MED ORDER — IOHEXOL 350 MG/ML SOLN: 100.0000 mL | Freq: Once | INTRAVENOUS | Status: DC | PRN

## 2022-10-05 NOTE — Discharge Instructions (Addendum)

## 2022-10-05 NOTE — ED Triage Notes (Signed)
Pt with c/o pain in lower abdomen x a month. Pt states she has had CT scan and seen specialists. Pt states pain increased this am and she thought she was going to pass out. Pt denies urinary symptoms and denies N/V

## 2022-10-05 NOTE — ED Provider Notes (Signed)
[facility] Provider Note    Event Date/Time   First MD Initiated Contact with Patient 10/05/22 1100     (approximate)   History   Abdominal Pain   HPI  Vanessa Hogan is a 81 y.o. female with a history of diverticulosis presents to ER for evaluation of abdominal pain severe in nature the lower abdomen occurred this morning but she is about to pass out.  Denies any chest pain has had some intermittent shortness of breath.  Felt cold chills like she was about to pass out secondary to the pain no history of kidney stones no dysuria.  She did try taking a dose of metronidazole yesterday which left over from diverticul-itis prescription from a while back.   Physical Exam   Triage Vital Signs: ED Triage Vitals  Enc Vitals Group     BP 10/05/22 1021 (!) 197/90     Pulse Rate 10/05/22 1021 80     Resp 10/05/22 1021 18     Temp 10/05/22 1022 97.9 F (36.6 C)     Temp Source 10/05/22 1022 Oral     SpO2 10/05/22 1021 100 %     Weight 10/05/22 1017 211 lb (95.7 kg)     Height 10/05/22 1017 5\' 5"  (1.651 m)     Head Circumference --      Peak Flow --      Pain Score 10/05/22 1017 4     Pain Loc --      Pain Edu? --      Excl. in GC? --     Most recent vital signs: Vitals:   10/05/22 1022 10/05/22 1343  BP:  (!) 180/88  Pulse:  78  Resp:  18  Temp: 97.9 F (36.6 C) 98 F (36.7 C)  SpO2:  100%     Constitutional: Alert  Eyes: Conjunctivae are normal.  Head: Atraumatic. Nose: No congestion/rhinnorhea. Mouth/Throat: Mucous membranes are moist.   Neck: Painless ROM.  Cardiovascular:   Good peripheral circulation. Respiratory: Normal respiratory effort.  No retractions.  Gastrointestinal: Soft and nontender.  Musculoskeletal:  no deformity Neurologic:  MAE spontaneously. No gross focal neurologic deficits are appreciated.  Skin:  Skin is warm, dry and intact. No rash noted. Psychiatric: Mood and affect are normal. Speech and behavior  are normal.    ED Results / Procedures / Treatments   Labs (all labs ordered are listed, but only abnormal results are displayed) Labs Reviewed  RESP PANEL BY RT-PCR (RSV, FLU A&B, COVID)  RVPGX2 - Abnormal; Notable for the following components:      Result Value   SARS Coronavirus 2 by RT PCR POSITIVE (*)    All other components within normal limits  COMPREHENSIVE METABOLIC PANEL - Abnormal; Notable for the following components:   Glucose, Bld 119 (*)    All other components within normal limits  CBC - Abnormal; Notable for the following components:   WBC 3.9 (*)    RBC 6.20 (*)    HCT 47.3 (*)    MCV 76.3 (*)    MCH 23.9 (*)    RDW 16.7 (*)    All other components within normal limits  URINALYSIS, ROUTINE W REFLEX MICROSCOPIC - Abnormal; Notable for the following components:   Color, Urine STRAW (*)    APPearance CLEAR (*)    Leukocytes,Ua SMALL (*)    All other components within normal limits  LIPASE, BLOOD  TROPONIN I (HIGH SENSITIVITY)  TROPONIN I (HIGH SENSITIVITY)  EKG  ED ECG REPORT I, Willy Eddy, the attending physician, personally viewed and interpreted this ECG.   Date: 10/05/2022  EKG Time: 10:18  Rate: 75  Rhythm: sinus  Axis: normal  Intervals: rbbb  ST&T Change: no stemi, nonspecific st abn    RADIOLOGY Please see ED Course for my review and interpretation.  I personally reviewed all radiographic images ordered to evaluate for the above acute complaints and reviewed radiology reports and findings.  These findings were personally discussed with the patient.  Please see medical record for radiology report.    PROCEDURES:  Critical Care performed:   Procedures   MEDICATIONS ORDERED IN ED: Medications  sodium chloride 0.9 % bolus 500 mL (0 mLs Intravenous Stopped 10/05/22 1343)     IMPRESSION / MDM / ASSESSMENT AND PLAN / ED COURSE  I reviewed the triage vital signs and the nursing notes.                               Differential diagnosis includes, but is not limited to, gastritis, appendicitis, COVID, pneumonia, flu, dehydration, electrolyte abnormality  Patient presenting to the ER for evaluation of symptoms as described above.  Based on symptoms, risk factors and considered above differential, this presenting complaint could reflect a potentially life-threatening illness therefore the patient will be placed on continuous pulse oximetry and telemetry for monitoring.  Laboratory evaluation will be sent to evaluate for the above complaints.  Patient without any focal pain or discomfort on exam.  Given constellation of symptoms hide suspicion for viral illness.  Will check labs x-ray and observe in the ER.  May consider CT imaging based on findings but given her lack of pain right now fever I have a lower suspicion for appendicitis or acute intra-abdominal process.  Not consistent with dissection or AAA.  Clinical Course as of 10/05/22 1859  Thu Oct 05, 2022  1308 Patient found to be COVID-positive.  Repeat abdominal exam is soft benign.  She adamantly denies any abdominal pain at this time.  Further questioning seems that these symptoms with relation to her abdomen have been ongoing for quite some time.  Does not seem consistent with mesenteric ischemia.  I do suspect her symptoms are more likely secondary to enteritis likely secondary to COVID-19.  Her chest x-ray on my review and interpretation does not show any evidence of infiltrate or consolidation.  She would like to be prescribed Paxlovid.  Do not feel that CT imaging of the abdomen clinically indicated at this time. [PR]    Clinical Course User Index [PR] Willy Eddy, MD       FINAL CLINICAL IMPRESSION(S) / ED DIAGNOSES   Final diagnoses:  Generalized abdominal pain  COVID-19     Rx / DC Orders   ED Discharge Orders          Ordered    nirmatrelvir/ritonavir EUA (PAXLOVID) 20 x 150 MG & 10 x 100MG  TABS  2 times daily         10/05/22 1308             Note:  This document was prepared using Dragon voice recognition software and may include unintentional dictation errors.    10/07/22, MD 10/05/22 1859

## 2022-10-13 ENCOUNTER — Ambulatory Visit: Payer: Self-pay | Admitting: *Deleted

## 2022-10-13 NOTE — Telephone Encounter (Signed)
  Chief Complaint: Return of Covid Symptoms after completing Paxlovid. Symptoms: Having joint and body aches, chills, tired and this is how I started with Covid and went to the ED.  Frequency: Symptoms returning Pertinent Negatives: Patient denies fever Disposition: [] ED /[x] Urgent Care (no appt availability in office) / [] Appointment(In office/virtual)/ []  [facility]/ [] Home Care/ [] Refused Recommended Disposition /[] [place] [facility]/ []  Follow-up with PCP Additional Notes: I referred her to the urgent care since there are no appts with [facility].    Pt agreeable to this plan.

## 2022-10-13 NOTE — Telephone Encounter (Signed)
Reason for Disposition  [1] HIGH RISK patient (e.g., weak immune system, age > 64 years, obesity with BMI 30 or higher, pregnant, chronic lung disease or other chronic medical condition) AND [2] COVID symptoms (e.g., cough, fever)  (Exceptions: Already seen by PCP and no new or worsening symptoms.)  Answer Assessment - Initial Assessment Questions 1. COVID-19 DIAGNOSIS: "How do you know that you have COVID?" (e.g., positive lab test or self-test, diagnosed by doctor or NP/PA, symptoms after exposure).     I took the Paxlovid for Covid.  I'm having chills and joint aches have returned and I'm feeling tired.   The pain in my stomach is gone.  I had it when I went to the ED.   That's why I went to the ED. 10/05/2022 seen in ED and given Paxlovid for Covid.     2. COVID-19 EXPOSURE: "Was there any known exposure to COVID before the symptoms began?" CDC Definition of close contact: within 6 feet (2 meters) for a total of 15 minutes or more over a 24-hour period.      Not asked 3. ONSET: "When did the COVID-19 symptoms start?"      Seen in ED 10/05/2022. 4. WORST SYMPTOM: "What is your worst symptom?" (e.g., cough, fever, shortness of breath, muscle aches)     Not asked 5. COUGH: "Do you have a cough?" If Yes, ask: "How bad is the cough?"       No   Never had a cough 6. FEVER: "Do you have a fever?" If Yes, ask: "What is your temperature, how was it measured, and when did it start?"     I'm having chills  She is checking temperature while on phone with me and it's 97.6 orally 7. RESPIRATORY STATUS: "Describe your breathing?" (e.g., normal; shortness of breath, wheezing, unable to speak)      A little shortness of breath    I'm breathing normally. 8. BETTER-SAME-WORSE: "Are you getting better, staying the same or getting worse compared to yesterday?"  If getting worse, ask, "In what way?"     My symptoms are coming back. 9. OTHER SYMPTOMS: "Do you have any other symptoms?"  (e.g., chills, fatigue,  headache, loss of smell or taste, muscle pain, sore throat)     Having a runny nose all along.  No sore throat. 10. HIGH RISK DISEASE: "Do you have any chronic medical problems?" (e.g., asthma, heart or lung disease, weak immune system, obesity, etc.)       Not asked 11. VACCINE: "Have you had the COVID-19 vaccine?" If Yes, ask: "Which one, how many shots, when did you get it?"       Not asked  12. PREGNANCY: "Is there any chance you are pregnant?" "When was your last menstrual period?"       N/A due to age 81. O2 SATURATION MONITOR:  "Do you use an oxygen saturation monitor (pulse oximeter) at home?" If Yes, ask "What is your reading (oxygen level) today?" "What is your usual oxygen saturation reading?" (e.g., 95%)       N/A  Protocols used: Coronavirus (COVID-19) Diagnosed or Suspected-A-AH

## 2022-10-17 ENCOUNTER — Ambulatory Visit: Payer: Self-pay | Admitting: *Deleted

## 2022-10-17 NOTE — Telephone Encounter (Signed)
Reason for Disposition  [1] MODERATE dizziness (e.g., interferes with normal activities) AND [2] has NOT been evaluated by doctor (or NP/PA) for this  (Exception: Dizziness caused by heat exposure, sudden standing, or poor fluid intake.)  Answer Assessment - Initial Assessment Questions 1. DESCRIPTION: "Describe your dizziness."     Covid since 14th.   Took med.   I had a rebound.   I went to urgent care.    I had a rebound of Covid after taking the medication.  I feel like I need pass out.   Joint pain, chills.      I went to the hospital and took the medication and felt better then the symptoms returned.   Friday my symptoms returned.   I talked with someone with urgent care.     I don't try to walk around I feel so dizzy and like I'm going to pass out.    I sit right down when I'm dizzy.    2. LIGHTHEADED: "Do you feel lightheaded?" (e.g., somewhat faint, woozy, weak upon standing)     I'm just really dizzy.    Temp. 96.2.  I'm having fatigue and passing out feeling.    I'm having these symptoms since taking the Covid medication. 3. VERTIGO: "Do you feel like either you or the room is spinning or tilting?" (i.e. vertigo)     N/A 4. SEVERITY: "How bad is it?"  "Do you feel like you are going to faint?" "Can you stand and walk?"   - MILD: Feels slightly dizzy, but walking normally.   - MODERATE: Feels unsteady when walking, but not falling; interferes with normal activities (e.g., school, work).   - SEVERE: Unable to walk without falling, or requires assistance to walk without falling; feels like passing out now.      My symptoms went away for a day or two but now they have concerned.   This morning I feel like I could pass out.    5. ONSET:  "When did the dizziness begin?"     When I first got up I felt ok this morning other than chills started.       6. AGGRAVATING FACTORS: "Does anything make it worse?" (e.g., standing, change in head position)     Walking around 7. HEART RATE: "Can you tell  me your heart rate?" "How many beats in 15 seconds?"  (Note: not all patients can do this)       Not asked 8. CAUSE: "What do you think is causing the dizziness?"     I have shortness of breath but it's not constant.   I'm not sure what's causing it.     9. RECURRENT SYMPTOM: "Have you had dizziness before?" If Yes, ask: "When was the last time?" "What happened that time?"     No 10. OTHER SYMPTOMS: "Do you have any other symptoms?" (e.g., fever, chest pain, vomiting, diarrhea, bleeding)       Chills, joint pain and feel like I could pass out. 11. PREGNANCY: "Is there any chance you are pregnant?" "When was your last menstrual period?"       N/A due to age  Protocols used: Dizziness - Lightheadedness-A-AH

## 2022-10-17 NOTE — Telephone Encounter (Signed)
  Chief Complaint: feels dizzy like she could pass out.   Had Covid, treated with antiviral and got rebound Covid.   Was better now having joint aches, chills, and dizziness like she could pass out. Symptoms: Fatigue  Frequency: Since this morning the dizziness/feels like passing out.   Not needing to hold onto things to ambulate but she is moving slowly and carefully.    Pertinent Negatives: Patient denies having diarrhea, no N/V.    "Drinking plenty of fluids and eating". Disposition: [] ED /[] Urgent Care (no appt availability in office) / [x] Appointment(In office/virtual)/ []  [facility]/ [] Home Care/ [] Refused Recommended Disposition /[] [facility]/ []  Follow-up with PCP Additional Notes: Appt. Made with Erin Mecum, PA-C for 10/18/2022 at 9:00.   Went over s/s to go to the ED.   She did not want to go to the ED due to prolonged wait times.

## 2022-10-18 ENCOUNTER — Ambulatory Visit: Payer: Medicare Other | Admitting: Physician Assistant

## 2022-10-18 ENCOUNTER — Encounter: Payer: Self-pay | Admitting: Family Medicine

## 2022-10-18 ENCOUNTER — Telehealth: Payer: Self-pay | Admitting: *Deleted

## 2022-10-18 ENCOUNTER — Ambulatory Visit (INDEPENDENT_AMBULATORY_CARE_PROVIDER_SITE_OTHER): Payer: Medicare Other | Admitting: Family Medicine

## 2022-10-18 VITALS — BP 142/81 | HR 81 | Temp 98.4°F | Ht 65.0 in | Wt 215.3 lb

## 2022-10-18 DIAGNOSIS — R079 Chest pain, unspecified: Secondary | ICD-10-CM

## 2022-10-18 DIAGNOSIS — R42 Dizziness and giddiness: Secondary | ICD-10-CM

## 2022-10-18 DIAGNOSIS — R0989 Other specified symptoms and signs involving the circulatory and respiratory systems: Secondary | ICD-10-CM

## 2022-10-18 LAB — BAYER DCA HB A1C WAIVED: HB A1C (BAYER DCA - WAIVED): 7.2 % — ABNORMAL HIGH (ref 4.8–5.6)

## 2022-10-18 NOTE — Progress Notes (Signed)
BP (!) 142/81 (BP Location: Left Arm, Cuff Size: Normal)   Pulse 81   Temp 98.4 F (36.9 C) (Oral)   Ht 5\' 5"  (1.651 m)   Wt 215 lb 4.8 oz (97.7 kg)   SpO2 98%   BMI 35.83 kg/m    Subjective:    Patient ID: , female    DOB: 1941/04/01, 81 y.o.   MRN: [id number]  HPI: Vanessa Hogan is a 81 y.o. female  Chief Complaint  Patient presents with   Covid Positive    Patient says recently tested positive for COVID on 10/05/22. Patient was seen and treated by UC. Patient says she is having lingering of feeling as if she is going to passing and wanted to discuss with provider.    Dizziness   Chest Pain    Patient says she has intermittent chest pain that she has a history of muscular discomfort. Patient says she just wanted to bring it up and discuss with provider at today's visit.    DIZZINESS Duration: for about 4 days, was also going on during COVID but it went away Description of symptoms:  "fading to black" Duration of episode: "varies" usually hours at a time Dizziness frequency: 1x a day Provoking factors: nothing Aggravating factors:  none Triggered by rolling over in bed: no Triggered by bending over: no Aggravated by head movement: no Aggravated by exertion, coughing, loud noises: no Recent head injury: no Recent or current viral symptoms: yes History of vasovagal episodes: no Nausea: no Vomiting: no Tinnitus: no Hearing loss: no Aural fullness: yes Headache: no Photophobia/phonophobia: yes Unsteady gait: no Postural instability: no Diplopia, dysarthria, dysphagia or weakness: no Related to exertion: no Pallor: no Diaphoresis: yes Dyspnea: yes Chest pain: yes  Relevant past medical, surgical, family and social history reviewed and updated as indicated. Interim medical history since our last visit reviewed. Allergies and medications reviewed and updated.  Review of Systems  Constitutional:  Positive for diaphoresis and  fatigue. Negative for activity change, appetite change, chills, fever and unexpected weight change.  HENT: Negative.    Eyes: Negative.   Respiratory: Negative.    Cardiovascular:  Positive for chest pain. Negative for palpitations and leg swelling.  Gastrointestinal: Negative.   Musculoskeletal: Negative.   Neurological:  Positive for dizziness and light-headedness. Negative for tremors, seizures, syncope, facial asymmetry, speech difficulty, weakness, numbness and headaches.  Psychiatric/Behavioral: Negative.      Per HPI unless specifically indicated above     Objective:    BP (!) 142/81 (BP Location: Left Arm, Cuff Size: Normal)   Pulse 81   Temp 98.4 F (36.9 C) (Oral)   Ht 5\' 5"  (1.651 m)   Wt 215 lb 4.8 oz (97.7 kg)   SpO2 98%   BMI 35.83 kg/m   Wt Readings from Last 3 Encounters:  10/18/22 215 lb 4.8 oz (97.7 kg)  10/05/22 211 lb (95.7 kg)  09/28/22 209 lb 14.4 oz (95.2 kg)   Orthostatic VS for the past 24 hrs (Last 3 readings):  BP- Lying Pulse- Lying BP- Sitting Pulse- Sitting BP- Standing at 0 minutes Pulse- Standing at 0 minutes  10/18/22 0944 147/81 81 144/83 79 141/81 80    Physical Exam Vitals and nursing note reviewed.  Constitutional:      General: She is not in acute distress.    Appearance: Normal appearance. She is well-developed. She is obese. She is not ill-appearing, toxic-appearing or diaphoretic.  HENT:     Head:  Normocephalic and atraumatic.     Right Ear: External ear normal.     Left Ear: External ear normal.     Nose: Nose normal.     Mouth/Throat:     Mouth: Mucous membranes are moist.     Pharynx: Oropharynx is clear.  Eyes:     General: No scleral icterus.       Right eye: No discharge.        Left eye: No discharge.     Extraocular Movements: Extraocular movements intact.     Conjunctiva/sclera: Conjunctivae normal.     Pupils: Pupils are equal, round, and reactive to light.  Cardiovascular:     Rate and Rhythm: Normal rate and  regular rhythm.     Pulses: Normal pulses.     Heart sounds: Normal heart sounds. No murmur heard.    No friction rub. No gallop.  Pulmonary:     Effort: Pulmonary effort is normal. No respiratory distress.     Breath sounds: No stridor. Examination of the right-lower field reveals rhonchi. Rhonchi present. No wheezing or rales.  Chest:     Chest wall: No tenderness.  Musculoskeletal:        General: Normal range of motion.     Cervical back: Normal range of motion and neck supple.  Skin:    General: Skin is warm and dry.     Capillary Refill: Capillary refill takes less than 2 seconds.     Coloration: Skin is not jaundiced or pale.     Findings: No bruising, erythema, lesion or rash.  Neurological:     General: No focal deficit present.     Mental Status: She is alert and oriented to person, place, and time. Mental status is at baseline.  Psychiatric:        Mood and Affect: Mood normal.        Behavior: Behavior normal.        Thought Content: Thought content normal.        Judgment: Judgment normal.     Results for orders placed or performed in visit on 10/18/22  Bayer DCA Hb A1c Waived  Result Value Ref Range   HB A1C (BAYER DCA - WAIVED) 7.2 (H) 4.8 - 5.6 %      Assessment & Plan:   Problem List Items Addressed This Visit       Other   Dizziness - Primary    ?Post-Covid. Will check labs and CXR. EKG no change. No orthostatics. Recheck 2 weeks. Await results.       Relevant Orders   CBC with Differential/Platelet   Comprehensive metabolic panel   TSH   VITAMIN D 25 Hydroxy (Vit-D Deficiency, Fractures)   Magnesium   Bayer DCA Hb A1c Waived (Completed)   Other Visit Diagnoses     Chest pain, unspecified type       No change from prior on EKG, but has been having near-syncope episodes. If continue, will get her back into cardiology. Await their input.   Relevant Orders   EKG 12-Lead (Completed)   Rhonchi at right lung base       Will check CXR. Await  results. Treat as needed.   Relevant Orders   DG Chest 2 View        Follow up plan: Return in about 2 weeks (around 11/01/2022).   30 minutes spent with patient today.

## 2022-10-18 NOTE — Telephone Encounter (Signed)
Opened chart to call and reschedule pt.   Was scheduled with [facility] but is a pt. At [facility].   Appt. With [facility] already cancelled and pt. Has an appt with [facility] this morning.

## 2022-10-18 NOTE — Assessment & Plan Note (Signed)
?  Post-Covid. Will check labs and CXR. EKG no change. No orthostatics. Recheck 2 weeks. Await results.

## 2022-10-19 LAB — COMPREHENSIVE METABOLIC PANEL
ALT: 14 IU/L (ref 0–32)
AST: 20 IU/L (ref 0–40)
Albumin/Globulin Ratio: 1.4 (ref 1.2–2.2)
Albumin: 4 g/dL (ref 3.7–4.7)
Alkaline Phosphatase: 62 IU/L (ref 44–121)
BUN/Creatinine Ratio: 11 — ABNORMAL LOW (ref 12–28)
BUN: 10 mg/dL (ref 8–27)
Bilirubin Total: 0.3 mg/dL (ref 0.0–1.2)
CO2: 22 mmol/L (ref 20–29)
Calcium: 9.8 mg/dL (ref 8.7–10.3)
Chloride: 101 mmol/L (ref 96–106)
Creatinine, Ser: 0.89 mg/dL (ref 0.57–1.00)
Globulin, Total: 2.9 g/dL (ref 1.5–4.5)
Glucose: 127 mg/dL — ABNORMAL HIGH (ref 70–99)
Potassium: 3.9 mmol/L (ref 3.5–5.2)
Sodium: 141 mmol/L (ref 134–144)
Total Protein: 6.9 g/dL (ref 6.0–8.5)
eGFR: 65 mL/min/{1.73_m2} (ref >59)

## 2022-10-19 LAB — CBC WITH DIFFERENTIAL/PLATELET
Basophils Absolute: 0 10*3/uL (ref 0.0–0.2)
Basos: 1 %
EOS (ABSOLUTE): 0.1 10*3/uL (ref 0.0–0.4)
Eos: 2 %
Hematocrit: 45.4 % (ref 34.0–46.6)
Hemoglobin: 14.2 g/dL (ref 11.1–15.9)
Immature Grans (Abs): 0 10*3/uL (ref 0.0–0.1)
Immature Granulocytes: 0 %
Lymphocytes Absolute: 1.9 10*3/uL (ref 0.7–3.1)
Lymphs: 52 %
MCH: 23.8 pg — ABNORMAL LOW (ref 26.6–33.0)
MCHC: 31.3 g/dL — ABNORMAL LOW (ref 31.5–35.7)
MCV: 76 fL — ABNORMAL LOW (ref 79–97)
Monocytes Absolute: 0.3 10*3/uL (ref 0.1–0.9)
Monocytes: 8 %
Neutrophils Absolute: 1.4 10*3/uL (ref 1.4–7.0)
Neutrophils: 37 %
Platelets: 193 10*3/uL (ref 150–450)
RBC: 5.96 x10E6/uL — ABNORMAL HIGH (ref 3.77–5.28)
RDW: 16.6 % — ABNORMAL HIGH (ref 11.7–15.4)
WBC: 3.7 10*3/uL (ref 3.4–10.8)

## 2022-10-19 LAB — MAGNESIUM: Magnesium: 1.9 mg/dL (ref 1.6–2.3)

## 2022-10-19 LAB — VITAMIN D 25 HYDROXY (VIT D DEFICIENCY, FRACTURES): Vit D, 25-Hydroxy: 37.6 ng/mL (ref 30.0–100.0)

## 2022-10-19 LAB — TSH: TSH: 0.903 u[IU]/mL (ref 0.450–4.500)

## 2022-10-20 ENCOUNTER — Emergency Department
Admission: EM | Admit: 2022-10-20 | Discharge: 2022-10-20 | Disposition: A | Discharge status: Home / Self Care | Payer: Medicare Other | Attending: Emergency Medicine | Admitting: Emergency Medicine

## 2022-10-20 ENCOUNTER — Emergency Department: Payer: Medicare Other

## 2022-10-20 ENCOUNTER — Encounter: Payer: Self-pay | Admitting: Emergency Medicine

## 2022-10-20 ENCOUNTER — Other Ambulatory Visit: Payer: Self-pay

## 2022-10-20 DIAGNOSIS — I10 Essential (primary) hypertension: Secondary | ICD-10-CM | POA: Diagnosis not present

## 2022-10-20 DIAGNOSIS — R55 Syncope and collapse: Secondary | ICD-10-CM | POA: Insufficient documentation

## 2022-10-20 DIAGNOSIS — R0602 Shortness of breath: Secondary | ICD-10-CM | POA: Diagnosis present

## 2022-10-20 DIAGNOSIS — R109 Unspecified abdominal pain: Secondary | ICD-10-CM | POA: Diagnosis not present

## 2022-10-20 DIAGNOSIS — U071 COVID-19: Secondary | ICD-10-CM | POA: Insufficient documentation

## 2022-10-20 DIAGNOSIS — E119 Type 2 diabetes mellitus without complications: Secondary | ICD-10-CM | POA: Diagnosis not present

## 2022-10-20 LAB — RESP PANEL BY RT-PCR (RSV, FLU A&B, COVID)  RVPGX2
Influenza A by PCR: NEGATIVE
Influenza B by PCR: NEGATIVE
Resp Syncytial Virus by PCR: NEGATIVE
SARS Coronavirus 2 by RT PCR: NEGATIVE

## 2022-10-20 LAB — CBC WITH DIFFERENTIAL/PLATELET
Abs Immature Granulocytes: 0.01 10*3/uL (ref 0.00–0.07)
Basophils Absolute: 0 10*3/uL (ref 0.0–0.1)
Basophils Relative: 0 %
Eosinophils Absolute: 0.1 10*3/uL (ref 0.0–0.5)
Eosinophils Relative: 1 %
HCT: 44.6 % (ref 36.0–46.0)
Hemoglobin: 14.2 g/dL (ref 12.0–15.0)
Immature Granulocytes: 0 %
Lymphocytes Relative: 35 %
Lymphs Abs: 1.8 10*3/uL (ref 0.7–4.0)
MCH: 23.9 pg — ABNORMAL LOW (ref 26.0–34.0)
MCHC: 31.8 g/dL (ref 30.0–36.0)
MCV: 75.1 fL — ABNORMAL LOW (ref 80.0–100.0)
Monocytes Absolute: 0.3 10*3/uL (ref 0.1–1.0)
Monocytes Relative: 6 %
Neutro Abs: 2.9 10*3/uL (ref 1.7–7.7)
Neutrophils Relative %: 58 %
Platelets: 221 10*3/uL (ref 150–400)
RBC: 5.94 MIL/uL — ABNORMAL HIGH (ref 3.87–5.11)
RDW: 16.5 % — ABNORMAL HIGH (ref 11.5–15.5)
WBC: 5.1 10*3/uL (ref 4.0–10.5)
nRBC: 0 % (ref 0.0–0.2)

## 2022-10-20 LAB — BASIC METABOLIC PANEL
Anion gap: 8 (ref 5–15)
BUN: 11 mg/dL (ref 8–23)
CO2: 24 mmol/L (ref 22–32)
Calcium: 9.6 mg/dL (ref 8.9–10.3)
Chloride: 106 mmol/L (ref 98–111)
Creatinine, Ser: 0.84 mg/dL (ref 0.44–1.00)
GFR, Estimated: >60 mL/min (ref >60)
Glucose, Bld: 93 mg/dL (ref 70–99)
Potassium: 3.4 mmol/L — ABNORMAL LOW (ref 3.5–5.1)
Sodium: 138 mmol/L (ref 135–145)

## 2022-10-20 LAB — TROPONIN I (HIGH SENSITIVITY)
Troponin I (High Sensitivity): 7 ng/L (ref <18)
Troponin I (High Sensitivity): 8 ng/L (ref <18)

## 2022-10-20 NOTE — ED Provider Notes (Signed)
[facility] Provider Note    Event Date/Time   First MD Initiated Contact with Patient 10/20/22 1755     (approximate)  History   Chief Complaint: Shortness of Breath  HPI  Vanessa Hogan is a 81 y.o. female with a past medical history anxiety, diabetes, gastric reflux, hypertension presents to the emergency department for intermittent episodes of dizziness lightheaded chest pain abdominal pain.  According to the patient 2 weeks ago she was diagnosed with COVID by her PCP.  She states since that time she has been experiencing episodes while she will get lightheaded or dizzy sometimes have chest pain sometimes have abdominal pain.  Patient states she completed a course of Paxlovid and was feeling better while on this medication but feels like these events are happening more frequently since stopping this medication over a week ago.  Patient was referred by her PCP to come to the emergency department for further workup.  Currently patient denies any chest pain.  No pleuritic pain.  No shortness of breath no abdominal pain currently no nausea vomiting or diarrhea.  Physical Exam   Triage Vital Signs: ED Triage Vitals  Enc Vitals Group     BP 10/20/22 1226 (!) 187/89     Pulse Rate 10/20/22 1226 82     Resp 10/20/22 1226 18     Temp 10/20/22 1227 98.4 F (36.9 C)     Temp Source 10/20/22 1227 Oral     SpO2 10/20/22 1226 95 %     Weight --      Height --      Head Circumference --      Peak Flow --      Pain Score 10/20/22 1227 0     Pain Loc --      Pain Edu? --      Excl. in GC? --     Most recent vital signs: Vitals:   10/20/22 1227 10/20/22 1618  BP:  (!) 149/53  Pulse:  72  Resp:  18  Temp: 98.4 F (36.9 C) 98.2 F (36.8 C)  SpO2:  100%    General: Awake, no distress.  CV:  Good peripheral perfusion.  Regular rate and rhythm  Resp:  Normal effort.  Equal breath sounds bilaterally.  Abd:  No distention.  Soft, nontender.  No  rebound or guarding.   ED Results / Procedures / Treatments   EKG  EKG viewed and interpreted by myself shows a sinus rhythm 83 bpm with a widened QRS, slight PR prolongation otherwise normal intervals, nonspecific ST changes.  Largely unchanged from prior EKG 10/06/2022.  RADIOLOGY  I have reviewed and interpreted the chest x-ray images.  No concerning finding on my evaluation.  No consolidation. Radiology is read the chest x-ray is negative   MEDICATIONS ORDERED IN ED: Medications - No data to display   IMPRESSION / MDM / ASSESSMENT AND PLAN / ED COURSE  I reviewed the triage vital signs and the nursing notes.  Patient's presentation is most consistent with acute presentation with potential threat to life or bodily function.  Patient presents emergency department for intermittent episodes of near syncope which she describes more as dizziness sometimes with chest pain sometimes abdominal pain sometimes and no pain at all.  Patient states symptoms have been ongoing over the past 2 weeks that she was diagnosed with COVID.  Patient states respiratory symptoms have resolved, denies any cough congestion.  No shortness of breath no fever no pleuritic  pain.  Patient's workup in the emergency department is reassuring including normal CBC with a normal white blood cell count, negative troponin x 2, normal chemistry, RSV/flu/COVID is negative in the emergency department and chest x-ray is clear.  I discussed this with the patient she is quite reassured by the workup.  Highly suspect the patient's symptoms are still related to her recent COVID infection.  I discussed more supportive care at home and PCP follow-up.  Patient has an appointment scheduled next week with her doctor.  FINAL CLINICAL IMPRESSION(S) / ED DIAGNOSES   Near syncope COVID-19   Note:  This document was prepared using Dragon voice recognition software and may include unintentional dictation errors.   Minna Antis,  MD 10/20/22 1810

## 2022-10-20 NOTE — ED Provider Triage Note (Signed)
Emergency Medicine Provider Triage Evaluation Note  Olar Santini , a 81 y.o. female  was evaluated in triage.  Pt complains of SOB that began this morning at 2am, woke her from sleep. Reports that she had cough/runny nose over the past few days. Took a home covid test on 12/14 that was positive. Also has chest tightness for a few days. Reports that her chest tightness improves with ativan  Review of Systems  Positive: Cp/sob/runny nose Negative: Abd pain  Physical Exam  There were no vitals taken for this visit. Gen:   Awake, no distress   Resp:  Normal effort  MSK:   Moves extremities without difficulty  Other:    Medical Decision Making  Medically screening exam initiated at 12:24 PM.  Appropriate orders placed.  Amiliah Corrie Dandy Schaller was informed that the remainder of the evaluation will be completed by another provider, this initial triage assessment does not replace that evaluation, and the importance of remaining in the ED until their evaluation is complete.     Jackelyn Hoehn, PA-C 10/20/22 1227

## 2022-10-20 NOTE — ED Triage Notes (Signed)
Patient arrives by POV c/o shortness of breath that woke her from her sleep around 2am. Patient states she was covid + on the 14th. Reports intermittent chest tightness ongoing. States when she takes an ativan it usually relieves it. Patient adds she saw her PCP a few days ago and wanted her to have chest x-ray to check for fluid on lungs.

## 2022-10-25 ENCOUNTER — Telehealth: Payer: Self-pay

## 2022-10-25 NOTE — Telephone Encounter (Signed)
Transition Care Management Follow-up Telephone Call Date of discharge and from where: 04/20/22, [facility] How have you been since you were released from the hospital? Better Any questions or concerns? No  Items Reviewed: Did the pt receive and understand the discharge instructions provided? Yes  Medications obtained and verified? Yes  Other? No  Any new allergies since your discharge? No  Dietary orders reviewed? Yes Do you have support at home? Yes   Home Care and Equipment/Supplies: Were home health services ordered? no If so, what is the name of the agency? N/A  Has the agency set up a time to come to the patient's home? not applicable Were any new equipment or medical supplies ordered?  No What is the name of the medical supply agency? N/A Were you able to get the supplies/equipment? not applicable Do you have any questions related to the use of the equipment or supplies? No  Functional Questionnaire: (I = Independent and D = Dependent) ADLs: I  Bathing/Dressing- I  Meal Prep- I  Eating- I  Maintaining continence- I  Transferring/Ambulation- I  Managing Meds- I  Follow up appointments reviewed:  PCP Hospital f/u appt confirmed? Yes  Scheduled to see Dr. Wynetta Emery on 11/01/22 @ 9:40 AM. [facility] f/u appt confirmed? No  Are transportation arrangements needed? No  If their condition worsens, is the pt aware to call PCP or go to the Emergency Dept.? Yes Was the patient provided with contact information for the PCP's office or ED? Yes Was to pt encouraged to call back with questions or concerns? Yes

## 2022-10-26 ENCOUNTER — Other Ambulatory Visit: Payer: Medicare Other

## 2022-10-30 ENCOUNTER — Telehealth: Payer: Self-pay | Admitting: Family Medicine

## 2022-10-30 DIAGNOSIS — E119 Type 2 diabetes mellitus without complications: Secondary | ICD-10-CM

## 2022-10-31 NOTE — Telephone Encounter (Signed)
Requested Prescriptions  Pending Prescriptions Disp Refills   Insulin Lispro Prot & Lispro (HUMALOG MIX 75/25 KWIKPEN) (75-25) 100 UNIT/ML [employer] Name: HUMALOG MIX 75/25 KWIKPEN (75-25) 1] 15 mL 0    Sig: INJECT 18 UNITS SUBCUTANEOUSLY EVERY MORNING AND 20 UNITS EVERY EVENING     Endocrinology:  Diabetes - Insulins Passed - 10/30/2022  9:28 AM      Passed - HBA1C is between 0 and 7.9 and within 180 days    HB A1C (BAYER DCA - WAIVED)  Date Value Ref Range Status  10/18/2022 7.2 (H) 4.8 - 5.6 % Final    Comment:             Prediabetes: 5.7 - 6.4          Diabetes: >6.4          Glycemic control for adults with diabetes: <7.0          Passed - Valid encounter within last 6 months    Recent Outpatient Visits           1 week ago Dizziness   [place], Megan P, DO   1 month ago Type 2 diabetes mellitus with hyperglycemia, with long-term current use of insulin ([place])   [place] Mecum, Erin E, PA-C   2 months ago Periumbilical abdominal pain   [place], [place] T, NP   4 months ago Diabetes mellitus without complication ([place])   [place] Kathrine Haddock, NP   5 months ago Chest pain, unspecified type   [facility] Kathrine Haddock, NP       Future Appointments             Tomorrow Wynetta Emery, Barb Merino, DO [facility], [place]   In 1 month [place], Barb Merino, DO [employer], [place]

## 2022-11-01 ENCOUNTER — Encounter: Payer: Self-pay | Admitting: Family Medicine

## 2022-11-01 ENCOUNTER — Ambulatory Visit (INDEPENDENT_AMBULATORY_CARE_PROVIDER_SITE_OTHER): Payer: Medicare Other | Admitting: Family Medicine

## 2022-11-01 VITALS — BP 165/84 | HR 78 | Temp 98.6°F | Ht 65.0 in | Wt 220.5 lb

## 2022-11-01 DIAGNOSIS — R42 Dizziness and giddiness: Secondary | ICD-10-CM | POA: Diagnosis not present

## 2022-11-01 DIAGNOSIS — I10 Essential (primary) hypertension: Secondary | ICD-10-CM | POA: Diagnosis not present

## 2022-11-01 DIAGNOSIS — E119 Type 2 diabetes mellitus without complications: Secondary | ICD-10-CM

## 2022-11-01 NOTE — Progress Notes (Signed)
BP (!) 165/84 (BP Location: Left Arm, Cuff Size: Normal)   Pulse 78   Temp 98.6 F (37 C) (Oral)   Ht 5\' 5"  (1.651 m)   Wt 220 lb 8 oz (100 kg)   SpO2 95%   BMI 36.69 kg/m    Subjective:    Patient ID: Vanessa Hogan, female    DOB: 03-31-1941, 82 y.o.   MRN: [id number]  HPI: Vanessa Hogan is a 82 y.o. female  Chief Complaint  Patient presents with   Dizziness   Has been feeling a little better today. She is having less issues with feeling like she was blacking out. She has had some dizzienss that seems to be coming and going. Chills are getting much better. Feeling in general siginificantly better.   HYPERTENSION  Hypertension status: uncontrolled  Satisfied with current treatment? yes Duration of hypertension: chronic BP monitoring frequency:  rarely BP medication side effects:  no Medication compliance: excellent compliance Previous BP meds: valsartan-HCTZ Aspirin: no Recurrent headaches: no Visual changes: no Palpitations: no Dyspnea: no Chest pain: no Lower extremity edema: no Dizzy/lightheaded: no   Relevant past medical, surgical, family and social history reviewed and updated as indicated. Interim medical history since our last visit reviewed. Allergies and medications reviewed and updated.  Review of Systems  Constitutional: Negative.   Respiratory: Negative.    Cardiovascular: Negative.   Gastrointestinal: Negative.   Musculoskeletal: Negative.   Neurological: Negative.   Psychiatric/Behavioral: Negative.      Per HPI unless specifically indicated above     Objective:    BP (!) 165/84 (BP Location: Left Arm, Cuff Size: Normal)   Pulse 78   Temp 98.6 F (37 C) (Oral)   Ht 5\' 5"  (1.651 m)   Wt 220 lb 8 oz (100 kg)   SpO2 95%   BMI 36.69 kg/m   Wt Readings from Last 3 Encounters:  11/01/22 220 lb 8 oz (100 kg)  10/18/22 215 lb 4.8 oz (97.7 kg)  10/05/22 211 lb (95.7 kg)    Physical Exam Vitals and nursing note  reviewed.  Constitutional:      General: She is not in acute distress.    Appearance: Normal appearance. She is obese. She is not ill-appearing, toxic-appearing or diaphoretic.  HENT:     Head: Normocephalic and atraumatic.     Right Ear: External ear normal.     Left Ear: External ear normal.     Nose: Nose normal.     Mouth/Throat:     Mouth: Mucous membranes are moist.     Pharynx: Oropharynx is clear.  Eyes:     General: No scleral icterus.       Right eye: No discharge.        Left eye: No discharge.     Extraocular Movements: Extraocular movements intact.     Conjunctiva/sclera: Conjunctivae normal.     Pupils: Pupils are equal, round, and reactive to light.  Cardiovascular:     Rate and Rhythm: Normal rate and regular rhythm.     Pulses: Normal pulses.     Heart sounds: Normal heart sounds. No murmur heard.    No friction rub. No gallop.  Pulmonary:     Effort: Pulmonary effort is normal. No respiratory distress.     Breath sounds: Normal breath sounds. No stridor. No wheezing, rhonchi or rales.  Chest:     Chest wall: No tenderness.  Musculoskeletal:        General: Normal range  of motion.     Cervical back: Normal range of motion and neck supple.  Skin:    General: Skin is warm and dry.     Capillary Refill: Capillary refill takes less than 2 seconds.     Coloration: Skin is not jaundiced or pale.     Findings: No bruising, erythema, lesion or rash.  Neurological:     General: No focal deficit present.     Mental Status: She is alert and oriented to person, place, and time. Mental status is at baseline.  Psychiatric:        Mood and Affect: Mood normal.        Behavior: Behavior normal.        Thought Content: Thought content normal.        Judgment: Judgment normal.     Results for orders placed or performed during the hospital encounter of 10/20/22  Resp panel by RT-PCR (RSV, Flu A&B, Covid) Anterior Nasal Swab   Specimen: Anterior Nasal Swab  Result  Value Ref Range   SARS Coronavirus 2 by RT PCR NEGATIVE NEGATIVE   Influenza A by PCR NEGATIVE NEGATIVE   Influenza B by PCR NEGATIVE NEGATIVE   Resp Syncytial Virus by PCR NEGATIVE NEGATIVE  Basic metabolic panel  Result Value Ref Range   Sodium 138 135 - 145 mmol/L   Potassium 3.4 (L) 3.5 - 5.1 mmol/L   Chloride 106 98 - 111 mmol/L   CO2 24 22 - 32 mmol/L   Glucose, Bld 93 70 - 99 mg/dL   BUN 11 8 - 23 mg/dL   Creatinine, Ser 0.16 0.44 - 1.00 mg/dL   Calcium 9.6 8.9 - 01.0 mg/dL   GFR, Estimated >93 >23 mL/min   Anion gap 8 5 - 15  CBC with Differential/Platelet  Result Value Ref Range   WBC 5.1 4.0 - 10.5 K/uL   RBC 5.94 (H) 3.87 - 5.11 MIL/uL   Hemoglobin 14.2 12.0 - 15.0 g/dL   HCT 55.7 32.2 - 02.5 %   MCV 75.1 (L) 80.0 - 100.0 fL   MCH 23.9 (L) 26.0 - 34.0 pg   MCHC 31.8 30.0 - 36.0 g/dL   RDW 42.7 (H) 06.2 - 37.6 %   Platelets 221 150 - 400 K/uL   nRBC 0.0 0.0 - 0.2 %   Neutrophils Relative % 58 %   Neutro Abs 2.9 1.7 - 7.7 K/uL   Lymphocytes Relative 35 %   Lymphs Abs 1.8 0.7 - 4.0 K/uL   Monocytes Relative 6 %   Monocytes Absolute 0.3 0.1 - 1.0 K/uL   Eosinophils Relative 1 %   Eosinophils Absolute 0.1 0.0 - 0.5 K/uL   Basophils Relative 0 %   Basophils Absolute 0.0 0.0 - 0.1 K/uL   Immature Granulocytes 0 %   Abs Immature Granulocytes 0.01 0.00 - 0.07 K/uL  Troponin I (High Sensitivity)  Result Value Ref Range   Troponin I (High Sensitivity) 7 <18 ng/L  Troponin I (High Sensitivity)  Result Value Ref Range   Troponin I (High Sensitivity) 8 <18 ng/L      Assessment & Plan:   Problem List Items Addressed This Visit       Cardiovascular and Mediastinum   Essential hypertension - Primary (Chronic)    Running high today. Will monitor at home and recheck in a couple of weeks. Continue to monitor.         Endocrine   Diabetes mellitus without complication (HCC) (Chronic)  Has not been able to get her ozempic due to changes in her insurance- will get  her hooked up with pharmacy to see about getting her back on ozempic with goal of cutting down on insulin.       Relevant Medications   Insulin Lispro Prot & Lispro (HUMALOG MIX 75/25 KWIKPEN) (75-25) 100 UNIT/ML Kwikpen   Semaglutide,0.25 or 0.5MG /DOS, (OZEMPIC, 0.25 OR 0.5 MG/DOSE,) 2 MG/3ML SOPN   Other Relevant Orders   AMB Referral to Chronic Care Management Services     Other   Dizziness    Significantly better. Continue to monitor. Call with any concerns. Follow up 2 weeks.         Follow up plan: Return in about 4 weeks (around 11/29/2022).

## 2022-11-01 NOTE — Telephone Encounter (Signed)
Pls reach out to pt as out of med, insulin. Pt went to pick up this script and has not used the pharmacy before. She states they are counting the doses 18 units x daily injections and come up with 39 vs 30 and they are charging her 70.00 when before she always just had to pay 35 for 1 month. Pls fu with pt as not knowing if an issue with script or the pharmacy and she is out of insulin. Please reach out to advise. [phone]

## 2022-11-02 MED ORDER — INSULIN LISPRO PROT & LISPRO (75-25 MIX) 100 UNIT/ML KWIKPEN: PEN_INJECTOR | SUBCUTANEOUS | 0 refills | Status: DC

## 2022-11-02 NOTE — Telephone Encounter (Signed)
Please reach out to the pharmacy and see what's going on

## 2022-11-02 NOTE — Telephone Encounter (Signed)
Spoke with [facility] pharmacist and was informed that the way patient's instructions are directed on her Humalog prescription equals 38 units. Patient insurance was charging her two co-pays due to that being over a 30-day supply. Pharmacist informed me to have Dr.Johnson edit the prescription and added to the end of directions "patient may take up to 50 units per day max) and insurance should only charge one co-pay for the patient. Dr.Johnson has changed and updated prescription. Patient notified and verbalized understanding.

## 2022-11-03 ENCOUNTER — Telehealth: Payer: Self-pay

## 2022-11-03 NOTE — Progress Notes (Signed)
   11/03/2022 Name: Vanessa Hogan MRN: [id number] DOB: [birth year]  Chief Complaint  Patient presents with   Medication Problem    Ozempic assistance    Vanessa Hogan is a 82 y.o. year old female who presented for a telephone visit.   They were referred to the pharmacist by their PCP for assistance in managing medication access.   Patient is participating in a Managed Medicaid Plan:  No  Subjective: Patient referred to pharmacy to assist patient in obtaining An[place]res.  She was previously on 0.5mg  daily, but because of extenuating circumstances could no longer afford the medication.  To make what she had last she was using 0.25mg  weekly until running out the first of December.  Care Team: Primary Care Provider: Valerie Roys, DO  Medication Access/Adherence  Current Pharmacy:  [facility] [place], [address] [place] [address] Phone: [phone] Fax: [id number] [address], [place] [address] [place] [address] Phone: [phone] Fax: [place], [place] [address] [place] [address] Phone: [phone] Fax: [phone]   Patient reports affordability concerns with their medications: Yes  Patient reports access/transportation concerns to their pharmacy: No  Patient reports adherence concerns with their medications:  Yes  Due to being without medication for over a month  Objective:  Lab Results  Component Value Date   HGBA1C 7.2 (H) 10/18/2022   Lab Results  Component Value Date   CREATININE 0.84 10/20/2022   BUN 11 10/20/2022   NA 138 10/20/2022   K 3.4 (L) 10/20/2022   CL 106 10/20/2022   CO2 24 10/20/2022   Lab Results  Component Value Date   CHOL 204 (H) 12/28/2021   HDL 38 (L) 12/28/2021   LDLCALC 137  (H) 12/28/2021   TRIG 161 (H) 12/28/2021   CHOLHDL 5.4 (H) 12/28/2021   Assessment/Plan:  -Reaching out to Dr Wynetta Emery to request new script be placed for Ozempic 0.25mg  weekly x4, then 0.5mg  weekly x4 -Forwarding chart to medication assistance team to aid patient in application process for Ozempic  -Noted at most recent PCP visit, BP was 165/84- patient states it has not been elevated at home but could not provide numbers.  Suggesting follow-up to monitor and potential increase in valsartan/hctz.  Follow Up Plan: None needed at this time from pharmacy perspective.

## 2022-11-05 MED ORDER — OZEMPIC (0.25 OR 0.5 MG/DOSE) 2 MG/3ML ~~LOC~~ SOPN: PEN_INJECTOR | SUBCUTANEOUS | 6 refills | Status: AC

## 2022-11-05 NOTE — Assessment & Plan Note (Signed)
Significantly better. Continue to monitor. Call with any concerns. Follow up 2 weeks.

## 2022-11-05 NOTE — Assessment & Plan Note (Signed)
Running high today. Will monitor at home and recheck in a couple of weeks. Continue to monitor.

## 2022-11-05 NOTE — Assessment & Plan Note (Signed)
Has not been able to get her ozempic due to changes in her insurance- will get her hooked up with pharmacy to see about getting her back on ozempic with goal of cutting down on insulin.

## 2022-11-08 ENCOUNTER — Other Ambulatory Visit (HOSPITAL_COMMUNITY): Payer: Self-pay

## 2022-11-13 NOTE — Progress Notes (Signed)
Interpreted by me on 10/18/22. 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 No significant change from prior.

## 2022-11-29 ENCOUNTER — Ambulatory Visit (INDEPENDENT_AMBULATORY_CARE_PROVIDER_SITE_OTHER): Payer: Medicare Other | Admitting: Family Medicine

## 2022-11-29 ENCOUNTER — Other Ambulatory Visit: Payer: Self-pay | Admitting: Family Medicine

## 2022-11-29 ENCOUNTER — Encounter: Payer: Self-pay | Admitting: Family Medicine

## 2022-11-29 VITALS — BP 177/97 | HR 83 | Temp 98.4°F | Ht 65.0 in | Wt 217.4 lb

## 2022-11-29 DIAGNOSIS — Z794 Long term (current) use of insulin: Secondary | ICD-10-CM | POA: Diagnosis not present

## 2022-11-29 DIAGNOSIS — E119 Type 2 diabetes mellitus without complications: Secondary | ICD-10-CM

## 2022-11-29 DIAGNOSIS — R519 Headache, unspecified: Secondary | ICD-10-CM

## 2022-11-29 DIAGNOSIS — R809 Proteinuria, unspecified: Secondary | ICD-10-CM

## 2022-11-29 DIAGNOSIS — I10 Essential (primary) hypertension: Secondary | ICD-10-CM

## 2022-11-29 DIAGNOSIS — E1129 Type 2 diabetes mellitus with other diabetic kidney complication: Secondary | ICD-10-CM

## 2022-11-29 DIAGNOSIS — K219 Gastro-esophageal reflux disease without esophagitis: Secondary | ICD-10-CM

## 2022-11-29 MED ORDER — SUCRALFATE 1 G PO TABS: 1.0000 g | ORAL_TABLET | Freq: Three times a day (TID) | ORAL | 1 refills | Status: DC

## 2022-11-29 MED ORDER — CELECOXIB 100 MG PO CAPS: 100.0000 mg | ORAL_CAPSULE | Freq: Every day | ORAL | 1 refills | Status: DC

## 2022-11-29 MED ORDER — VALSARTAN-HYDROCHLOROTHIAZIDE 320-25 MG PO TABS: 1.0000 | ORAL_TABLET | Freq: Every day | ORAL | 1 refills | Status: DC

## 2022-11-29 NOTE — Assessment & Plan Note (Signed)
Has not gotten ozempic yet. Will work on getting it for her with goal of getting her off insulin.

## 2022-11-29 NOTE — Progress Notes (Signed)
BP (!) 177/97   Pulse 83   Temp 98.4 F (36.9 C) (Oral)   Ht 5\' 5"  (1.651 m)   Wt 217 lb 6.4 oz (98.6 kg)   SpO2 97%   BMI 36.18 kg/m    Subjective:    Patient ID: Vanessa Hogan, female    DOB: [birth year], 82 y.o.   MRN: [id number]  HPI: Vanessa Hogan is a 82 y.o. female  Chief Complaint  Patient presents with   Hypertension   Abdominal Pain    Patient says she was diagnosed with Diverticulitis back in October and says she thinks she is having a stomach ulcer as the symptoms are different this time around.    Headache    Patient says she is having head pains, that she says did not start until she started having a toothache. Patient says she first noticed the toothache around 2 weeks ago.    HYPERTENSION- has only been taking 1 pill of the clonidine daily  Hypertension status: uncontrolled  Satisfied with current treatment? no Duration of hypertension: chronic BP monitoring frequency:  not checking BP medication side effects:  no Medication compliance: good compliance Previous BP meds:clonidine, valsartan, HCTZ Aspirin: no Recurrent headaches: yes Visual changes: no Palpitations: no Dyspnea: no Chest pain: no Lower extremity edema: no Dizzy/lightheaded: no  DENTAL PAIN Duration: 2 weeks Involved teeth: R upper Dentist evaluation: not yet- scheduled for Monday Mechanism of injury:  no trauma Onset: sudden Severity: moderate Quality:  aching Frequency: intermittent Radiation: none Aggravating factors: chewing Alleviating factors: celebrex Status: better Treatments attempted: ice and NSAIDs Relief with NSAIDs?: significant Fevers: no Swelling: no Redness: no Paresthesias / decreased sensation: no Sinus pressure: no  ABDOMINAL PAIN  Duration:4-5 months Onset: sudden Severity: severe Quality: cramping Location:  epigastric  Episode duration: hours Radiation: no Frequency: intermittent Alleviating factors: drinking tea Status:  better Treatments attempted: ppi, tea Fever: no Nausea: yes Vomiting: no Weight loss: no Decreased appetite: yes Diarrhea: no Constipation: no Blood in stool: no Heartburn: yes Jaundice: no Rash: no Dysuria/urinary frequency: no Hematuria: no History of sexually transmitted disease: no Recurrent NSAID use: no   Relevant past medical, surgical, family and social history reviewed and updated as indicated. Interim medical history since our last visit reviewed. Allergies and medications reviewed and updated.  Review of Systems  Constitutional: Negative.   HENT: Negative.    Respiratory: Negative.    Cardiovascular: Negative.   Gastrointestinal:  Positive for abdominal pain. Negative for abdominal distention, anal bleeding, blood in stool, constipation, diarrhea, nausea, rectal pain and vomiting.  Musculoskeletal: Negative.   Psychiatric/Behavioral: Negative.      Per HPI unless specifically indicated above     Objective:    BP (!) 177/97   Pulse 83   Temp 98.4 F (36.9 C) (Oral)   Ht 5\' 5"  (1.651 m)   Wt 217 lb 6.4 oz (98.6 kg)   SpO2 97%   BMI 36.18 kg/m   Wt Readings from Last 3 Encounters:  11/29/22 217 lb 6.4 oz (98.6 kg)  11/01/22 220 lb 8 oz (100 kg)  10/18/22 215 lb 4.8 oz (97.7 kg)    Physical Exam Vitals and nursing note reviewed.  Constitutional:      General: She is not in acute distress.    Appearance: Normal appearance. She is well-developed and normal weight. She is not ill-appearing, toxic-appearing or diaphoretic.  HENT:     Head: Normocephalic and atraumatic.     Right Ear: External ear  normal.     Left Ear: External ear normal.     Nose: Nose normal.     Mouth/Throat:     Mouth: Mucous membranes are moist.     Pharynx: Oropharynx is clear.  Eyes:     General: No scleral icterus.       Right eye: No discharge.        Left eye: No discharge.     Extraocular Movements: Extraocular movements intact.     Conjunctiva/sclera: Conjunctivae  normal.     Pupils: Pupils are equal, round, and reactive to light.  Cardiovascular:     Rate and Rhythm: Normal rate and regular rhythm.     Pulses: Normal pulses.     Heart sounds: Normal heart sounds. No murmur heard.    No friction rub. No gallop.  Pulmonary:     Effort: Pulmonary effort is normal. No respiratory distress.     Breath sounds: Normal breath sounds. No stridor. No wheezing, rhonchi or rales.  Chest:     Chest wall: No tenderness.  Abdominal:     General: Abdomen is flat. Bowel sounds are normal. There is no distension.     Palpations: Abdomen is soft. There is no mass.     Tenderness: There is no abdominal tenderness. There is no right CVA tenderness, left CVA tenderness, guarding or rebound.     Hernia: No hernia is present.  Musculoskeletal:        General: Normal range of motion.     Cervical back: Normal range of motion and neck supple.  Skin:    General: Skin is warm and dry.     Capillary Refill: Capillary refill takes less than 2 seconds.     Coloration: Skin is not jaundiced or pale.     Findings: No bruising, erythema, lesion or rash.  Neurological:     General: No focal deficit present.     Mental Status: She is alert and oriented to person, place, and time. Mental status is at baseline.  Psychiatric:        Mood and Affect: Mood normal.        Behavior: Behavior normal.        Thought Content: Thought content normal.        Judgment: Judgment normal.     Results for orders placed or performed during the hospital encounter of 10/20/22  Resp panel by RT-PCR (RSV, Flu A&B, Covid) Anterior Nasal Swab   Specimen: Anterior Nasal Swab  Result Value Ref Range   SARS Coronavirus 2 by RT PCR NEGATIVE NEGATIVE   Influenza A by PCR NEGATIVE NEGATIVE   Influenza B by PCR NEGATIVE NEGATIVE   Resp Syncytial Virus by PCR NEGATIVE NEGATIVE  Basic metabolic panel  Result Value Ref Range   Sodium 138 135 - 145 mmol/L   Potassium 3.4 (L) 3.5 - 5.1 mmol/L    Chloride 106 98 - 111 mmol/L   CO2 24 22 - 32 mmol/L   Glucose, Bld 93 70 - 99 mg/dL   BUN 11 8 - 23 mg/dL   Creatinine, Ser 0.84 0.44 - 1.00 mg/dL   Calcium 9.6 8.9 - 10.3 mg/dL   GFR, Estimated >60 >60 mL/min   Anion gap 8 5 - 15  CBC with Differential/Platelet  Result Value Ref Range   WBC 5.1 4.0 - 10.5 K/uL   RBC 5.94 (H) 3.87 - 5.11 MIL/uL   Hemoglobin 14.2 12.0 - 15.0 g/dL   HCT 44.6 36.0 - 46.0 %  MCV 75.1 (L) 80.0 - 100.0 fL   MCH 23.9 (L) 26.0 - 34.0 pg   MCHC 31.8 30.0 - 36.0 g/dL   RDW 16.5 (H) 11.5 - 15.5 %   Platelets 221 150 - 400 K/uL   nRBC 0.0 0.0 - 0.2 %   Neutrophils Relative % 58 %   Neutro Abs 2.9 1.7 - 7.7 K/uL   Lymphocytes Relative 35 %   Lymphs Abs 1.8 0.7 - 4.0 K/uL   Monocytes Relative 6 %   Monocytes Absolute 0.3 0.1 - 1.0 K/uL   Eosinophils Relative 1 %   Eosinophils Absolute 0.1 0.0 - 0.5 K/uL   Basophils Relative 0 %   Basophils Absolute 0.0 0.0 - 0.1 K/uL   Immature Granulocytes 0 %   Abs Immature Granulocytes 0.01 0.00 - 0.07 K/uL  Troponin I (High Sensitivity)  Result Value Ref Range   Troponin I (High Sensitivity) 7 <18 ng/L  Troponin I (High Sensitivity)  Result Value Ref Range   Troponin I (High Sensitivity) 8 <18 ng/L      Assessment & Plan:   Problem List Items Addressed This Visit       Cardiovascular and Mediastinum   Essential hypertension (Chronic)    Not under good control. Will increase her valsartan-hctz to 320-25 and recheck 1 month. Call with any concerns. Continue to monitor.       Relevant Medications   valsartan-hydrochlorothiazide (DIOVAN-HCT) 320-25 MG tablet   Other Relevant Orders   Basic metabolic panel     Digestive   Gastroesophageal reflux disease    Will check stool for h pylori and will add carafate. Call with any concerns. Continue to monitor.      Relevant Medications   sucralfate (CARAFATE) 1 g tablet   Other Relevant Orders   H. 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 Will work on getting it for her with goal of getting her off insulin.       Relevant Medications   valsartan-hydrochlorothiazide (DIOVAN-HCT) 320-25 MG tablet   Other Visit Diagnoses     Facial pain       ?due to tooth- seeing dentistry on Monday. Will check ESR to r/o TA. Continue celebrex.   Relevant Orders   Sed Rate (ESR)        Follow up plan: Return in about 4 weeks (around 12/27/2022).

## 2022-11-29 NOTE — Assessment & Plan Note (Signed)
Will check stool for h pylori and will add carafate. Call with any concerns. Continue to monitor.

## 2022-11-29 NOTE — Assessment & Plan Note (Signed)
Not under good control. Will increase her valsartan-hctz to 320-25 and recheck 1 month. Call with any concerns. Continue to monitor.

## 2022-11-30 LAB — BASIC METABOLIC PANEL
BUN/Creatinine Ratio: 10 — ABNORMAL LOW (ref 12–28)
BUN: 9 mg/dL (ref 8–27)
CO2: 26 mmol/L (ref 20–29)
Calcium: 9.5 mg/dL (ref 8.7–10.3)
Chloride: 103 mmol/L (ref 96–106)
Creatinine, Ser: 0.88 mg/dL (ref 0.57–1.00)
Glucose: 162 mg/dL — ABNORMAL HIGH (ref 70–99)
Potassium: 4.2 mmol/L (ref 3.5–5.2)
Sodium: 142 mmol/L (ref 134–144)
eGFR: 66 mL/min/{1.73_m2} (ref >59)

## 2022-11-30 LAB — SEDIMENTATION RATE: Sed Rate: 16 mm/hr (ref 0–40)

## 2022-11-30 LAB — SPECIMEN STATUS REPORT

## 2022-11-30 NOTE — Telephone Encounter (Signed)
Unable to refill per protocol, Rx request is too soon. Last refill 11/29/22 for 30 days and 1 refill.  Requested Prescriptions  Pending Prescriptions Disp Refills   sucralfate (CARAFATE) 1 g tablet [Pharmacy Med Name: SUCRALFATE 1GM TABLETS] 360 tablet     Sig: TAKE 1 TABLET(1 GRAM) BY MOUTH FOUR TIMES DAILY, WITH MEALS AND AT BEDTIME     Gastroenterology: Antiacids Passed - 11/29/2022 11:22 AM      Passed - Valid encounter within last 12 months    Recent Outpatient Visits           Yesterday Controlled type 2 diabetes mellitus with microalbuminuria, with long-term current use of insulin ([place])   [place], Megan P, DO   4 weeks ago Essential hypertension   [place], [place], DO   1 month ago [place] P, DO   2 months ago Type 2 diabetes mellitus with hyperglycemia, with long-term current use of insulin ([place])   [place], Erin E, PA-C   3 months ago Periumbilical abdominal pain   [place], Henrine Screws T, NP       Future Appointments             In 4 weeks Wynetta Emery, Barb Merino, DO [place], [facility]

## 2022-11-30 NOTE — Telephone Encounter (Signed)
Requested Prescriptions  Pending Prescriptions Disp Refills   Insulin Lispro Prot & Lispro (HUMALOG MIX 75/25 KWIKPEN) (75-25) 100 UNIT/ML [employer] Med Name: HUMALOG MIX 75/25 KWIKPEN (75-25) 1] 15 mL 0    Sig: INJECT 18 UNITS SUBCUTANEOUSLY EVERY MORNING AND 20 UNITS EVERY EVENING (UP TO 50 UNITS PER DAY MAX)     Endocrinology:  Diabetes - Insulins Passed - 11/29/2022  4:22 PM      Passed - HBA1C is between 0 and 7.9 and within 180 days    HB A1C (BAYER DCA - WAIVED)  Date Value Ref Range Status  10/18/2022 7.2 (H) 4.8 - 5.6 % Final    Comment:             Prediabetes: 5.7 - 6.4          Diabetes: >6.4          Glycemic control for adults with diabetes: <7.0          Passed - Valid encounter within last 6 months    Recent Outpatient Visits           Yesterday Controlled type 2 diabetes mellitus with microalbuminuria, with long-term current use of insulin ([place])   [place], Megan P, DO   4 weeks ago Essential hypertension   [place], Cricket, DO   1 month ago [place] P, DO   2 months ago Type 2 diabetes mellitus with hyperglycemia, with long-term current use of insulin ([place])   [place], Erin E, PA-C   3 months ago Periumbilical abdominal pain   [place], Henrine Screws T, NP       Future Appointments             In 4 weeks Wynetta Emery, Barb Merino, DO [place], [facility]

## 2022-12-05 DIAGNOSIS — K219 Gastro-esophageal reflux disease without esophagitis: Secondary | ICD-10-CM | POA: Diagnosis not present

## 2022-12-07 LAB — H. PYLORI ANTIGEN, STOOL: H pylori Ag, Stl: NEGATIVE

## 2022-12-26 ENCOUNTER — Ambulatory Visit (INDEPENDENT_AMBULATORY_CARE_PROVIDER_SITE_OTHER): Payer: Medicare PPO

## 2022-12-26 VITALS — Ht 65.0 in | Wt 217.0 lb

## 2022-12-26 DIAGNOSIS — Z780 Asymptomatic menopausal state: Secondary | ICD-10-CM

## 2022-12-26 DIAGNOSIS — Z741 Need for assistance with personal care: Secondary | ICD-10-CM

## 2022-12-26 DIAGNOSIS — Z0000 Encounter for general adult medical examination without abnormal findings: Secondary | ICD-10-CM | POA: Diagnosis not present

## 2022-12-26 NOTE — Progress Notes (Signed)
I connected with  Watt Climes on 12/26/22 by a audio enabled telemedicine application and verified that I am speaking with the correct person using two identifiers.  Patient Location: Home  Provider Location: Office/Clinic  I discussed the limitations of evaluation and management by telemedicine. The patient expressed understanding and agreed to proceed.  Subjective:   Vanessa Hogan is a 82 y.o. female who presents for Medicare Annual (Subsequent) preventive examination.  Review of Systems     Cardiac Risk Factors include: advanced age (>54mn, >>64women);diabetes mellitus;hypertension     Objective:    There were no vitals filed for this visit. There is no height or weight on file to calculate BMI.     12/26/2022   10:25 AM 10/20/2022   12:27 PM 10/05/2022   10:19 AM 04/29/2022   12:23 PM 09/29/2021   10:06 AM 08/13/2021    9:44 AM  Advanced Directives  Does Patient Have a Medical Advance Directive? No No No No No No  Would patient like information on creating a medical advance directive? No - Patient declined  No - Patient declined  No - Patient declined No - Patient declined    Current Medications (verified) Outpatient Encounter Medications as of 12/26/2022  Medication Sig   ACCU-CHEK GUIDE test strip 1 each by Other route 3 (three) times daily.   Accu-Chek Softclix Lancets lancets SMARTSIG:Topical   allopurinol (ZYLOPRIM) 100 MG tablet TAKE 1 TABLET(100 MG) BY MOUTH DAILY   celecoxib (CELEBREX) 100 MG capsule Take 1 capsule (100 mg total) by mouth daily.   fluticasone (FLONASE) 50 MCG/ACT nasal spray Place into both nostrils.   GLOBAL EASE INJECT PEN NEEDLES 32G X 4 MM MISC SMARTSIG:1 needle SUB-Q 3 Times Daily   Insulin Lispro Prot & Lispro (HUMALOG MIX 75/25 KWIKPEN) (75-25) 100 UNIT/ML Kwikpen INJECT 18 UNITS SUBCUTANEOUSLY EVERY MORNING AND 20 UNITS EVERY EVENING (UP TO 50 UNITS PER DAY MAX)   loratadine (CLARITIN) 10 MG tablet Take 10 mg by mouth  daily as needed.   LORazepam (ATIVAN) 0.5 MG tablet Take 1 tablet (0.5 mg total) by mouth as needed for anxiety.   pantoprazole (PROTONIX) 40 MG tablet Take 40 mg by mouth daily.   Semaglutide,0.25 or 0.'5MG'$ /DOS, (OZEMPIC, 0.25 OR 0.5 MG/DOSE,) 2 MG/3ML SOPN 0.'25mg'$  weekly for 1 month, then increase to 0.'5mg'$  weekly   sucralfate (CARAFATE) 1 g tablet Take 1 tablet (1 g total) by mouth 4 (four) times daily -  with meals and at bedtime.   valsartan-hydrochlorothiazide (DIOVAN-HCT) 160-12.5 MG tablet TAKE 1 TABLET BY MOUTH DAILY   valsartan-hydrochlorothiazide (DIOVAN-HCT) 320-25 MG tablet Take 1 tablet by mouth daily.   cloNIDine (CATAPRES) 0.2 MG tablet Take 1 tablet by mouth daily. (Patient not taking: Reported on 12/26/2022)   metFORMIN (GLUCOPHAGE-XR) 500 MG 24 hr tablet Take 500 mg by mouth daily as needed. (Patient not taking: Reported on 12/26/2022)   rosuvastatin (CRESTOR) 10 MG tablet Take 1 tablet (10 mg total) by mouth daily. (Patient not taking: Reported on 12/26/2022)   No facility-administered encounter medications on file as of 12/26/2022.    Allergies (verified) Asa [aspirin], Metformin and related, Morphine and related, Nitroglycerin, Penicillins, and Sertraline   History: Past Medical History:  Diagnosis Date   Anxiety    Depression    Di[place]    GERD (gastroesophageal reflux disease)    Gout    Hypertension    Past Surgical History:  Procedure Laterality Date   ABDOMINAL HYSTERECTOMY  BREAST SURGERY     HERNIA REPAIR     TUBAL LIGATION     Family History  Problem Relation Age of Onset   Stroke Mother    Stroke Father    Dementia Sister    Hypertension Brother    Diabetes Daughter    Hypertension Son    Social History   Socioeconomic History   Marital status: Married    Spouse name: Not on file   Number of children: Not on file   Years of education: Not on file   Highest education level: Not on file  Occupational History    Not on file  Tobacco Use   Smoking status: Former    Packs/day: 0.25    Years: 5.00    Total pack years: 1.25    Types: Cigarettes    Quit date: 39    Years since quitting: 52.2   Smokeless tobacco: Former  Scientific laboratory technician Use: Never used  Substance and Sexual Activity   Alcohol use: Yes    Alcohol/week: 1.0 standard drink of alcohol    Types: 1 Glasses of wine per week    Comment: occasionally   Drug use: Never   Sexual activity: Not Currently    Birth control/protection: None  Other Topics Concern   Not on file  Social History Narrative   Not on file   Social Determinants of Health   Financial Resource Strain: Medium Risk (12/26/2022)   Overall Financial Resource Strain (CARDIA)    Difficulty of Paying Living Expenses: Somewhat hard  Food Insecurity: Food Insecurity Present (12/26/2022)   Hunger Vital Sign    Worried About Running Out of Food in the Last Year: Sometimes true    Ran Out of Food in the Last Year: Never true  Transportation Needs: No Transportation Needs (12/26/2022)   PRAPARE - Hydrologist (Medical): No    Lack of Transportation (Non-Medical): No  Physical Activity: Insufficiently Active (12/26/2022)   Exercise Vital Sign    Days of Exercise per Week: 3 days    Minutes of Exercise per Session: 30 min  Stress: No Stress Concern Present (12/26/2022)   [place]    Feeling of Stress : Only a little  Social Connections: Moderately Isolated (12/26/2022)   Social Connection and Isolation Panel [NHANES]    Frequency of Communication with Friends and Family: More than three times a week    Frequency of Social Gatherings with Friends and Family: Three times a week    Attends Religious Services: More than 4 times per year    Active Member of Clubs or Organizations: No    Attends Archivist Meetings: Never    Marital Status: Separated    Tobacco  Counseling Counseling given: Not Answered   Clinical Intake:  Pre-visit preparation completed: Yes  Pain : No/denies pain     Nutritional Risks: None Diabetes: Yes CBG done?: No Did pt. bring in CBG monitor from home?: No  How often do you need to have someone help you when you read instructions, pamphlets, or other written materials from your doctor or pharmacy?: 1 - Never  Diabetic?yes Nutrition Risk Assessment:  Has the patient had any N/V/D within the last 2 months?  Yes  Does the patient have any non-healing wounds?  No  Has the patient had any unintentional weight loss or weight gain?  No   Diabetes:  Is the patient diabetic?  Yes  If diabetic, was a CBG obtained today?  No  Did the patient bring in their glucometer from home?  No  How often do you monitor your CBG's? Every day and evening.   Financial Strains and Diabetes Management:  Are you having any financial strains with the device, your supplies or your medication? Yes .  Does the patient want to be seen by Chronic Care Management for management of their diabetes?  No  Would the patient like to be referred to a Nutritionist or for Diabetic Management?  No   Diabetic Exams:  Diabetic Eye Exam: Completed 05/19/22.  Pt has been advised about the importance in completing this exam.  Diabetic Foot Exam: Completed no. Pt has been advised about the importance in completing this exam.   Interpreter Needed?: No  Information entered by :: Kirke Shaggy, LPN   Activities of Daily Living    12/26/2022   10:33 AM  In your present state of health, do you have any difficulty performing the following activities:  Hearing? 0  Vision? 0  Difficulty concentrating or making decisions? 0  Walking or climbing stairs? 0  Dressing or bathing? 0  Doing errands, shopping? 0  Preparing Food and eating ? N  Using the Toilet? N  In the past six months, have you accidently leaked urine? N  Do you have problems with loss of  bowel control? N  Managing your Medications? N  Managing your Finances? N  Housekeeping or managing your Housekeeping? N    Patient Care Team: Valerie Roys, DO as PCP - General (Family Medicine)  Indicate any recent Medical Services you may have received from other than [facility] providers in the past year (date may be approximate).     Assessment:   This is a routine wellness examination for [facility].  Hearing/Vision screen Hearing Screening - Comments:: No aids Vision Screening - Comments:: Wears glasses  Dietary issues and exercise activities discussed: Current Exercise Habits: Home exercise routine, Type of exercise: walking, Time (Minutes): 30, Frequency (Times/Week): 7, Weekly Exercise (Minutes/Week): 210, Intensity: Mild   Goals Addressed             This Visit's Progress    DIET - EAT MORE FRUITS AND VEGETABLES         Depression Screen    12/26/2022   10:22 AM 11/29/2022   10:11 AM 11/01/2022    9:51 AM 09/28/2022   10:16 AM 08/18/2022    2:42 PM 06/29/2022    2:01 PM 05/09/2022    1:37 PM  PHQ 2/9 Scores  PHQ - 2 Score 0 '4 1 3 '$ 0 0 2  PHQ- 9 Score 0 '14 3 13 2 2 8    '$ Fall Risk    12/26/2022   10:33 AM 11/29/2022   10:12 AM 11/01/2022    9:51 AM 09/28/2022   10:16 AM 08/18/2022    2:41 PM  Fall Risk   Falls in the past year? 0 0 0 0 0  Number falls in past yr: 0 0 0 0 0  Injury with Fall? 0 0 0 0 0  Risk for fall due to : No Fall Risks No Fall Risks No Fall Risks No Fall Risks No Fall Risks  Follow up Falls prevention discussed;Falls evaluation completed Falls evaluation completed Falls evaluation completed Falls evaluation completed Falls evaluation completed    FALL RISK PREVENTION PERTAINING TO THE HOME:  Any stairs in or around the home? Yes  If so, are  there any without handrails? No  Home free of loose throw rugs in walkways, pet beds, electrical cords, etc? Yes  Adequate lighting in your home to reduce risk of falls? Yes   ASSISTIVE DEVICES UTILIZED  TO PREVENT FALLS:  Life alert? No  Use of a cane, walker or w/c? Yes -cane Grab bars in the bathroom? No  Shower chair or bench in shower? No  Elevated toilet seat or a handicapped toilet? Yes    Cognitive Function:        12/26/2022   10:42 AM  6CIT Screen  What Year? 0 points  What month? 0 points  What time? 0 points  Count back from 20 0 points  Months in reverse 0 points  Repeat phrase 0 points  Total Score 0 points    Immunizations Immunization History  Administered Date(s) Administered   Moderna Covid-19 Vaccine Bivalent Booster 42yr & up 08/15/2022   Moderna SARS-COV2 Booster Vaccination 01/29/2020, 02/26/2020   Moderna Sars-Covid-2 Vaccination 01/29/2020, 02/26/2020, 11/18/2020, 06/06/2021   PNEUMOCOCCAL CONJUGATE-20 09/28/2022   Pneumococcal-Unspecified 09/21/2016    TDAP status: Due, Education has been provided regarding the importance of this vaccine. Advised may receive this vaccine at local pharmacy or Health Dept. Aware to provide a copy of the vaccination record if obtained from local pharmacy or Health Dept. Verbalized acceptance and understanding.  Flu Vaccine status: Declined, Education has been provided regarding the importance of this vaccine but patient still declined. Advised may receive this vaccine at local pharmacy or Health Dept. Aware to provide a copy of the vaccination record if obtained from local pharmacy or Health Dept. Verbalized acceptance and understanding.  Pneumococcal vaccine status: Up to date  Covid-19 vaccine status: Completed vaccines  Qualifies for Shingles Vaccine? No   Zostavax completed No   Shingrix Completed?: No.    Education has been provided regarding the importance of this vaccine. Patient has been advised to call insurance company to determine out of pocket expense if they have not yet received this vaccine. Advised may also receive vaccine at local pharmacy or Health Dept. Verbalized acceptance and  understanding.  Screening Tests Health Maintenance  Topic Date Due   FOOT EXAM  Never done   DTaP/Tdap/Td (1 - Tdap) Never done   Zoster Vaccines- Shingrix (1 of 2) Never done   DEXA SCAN  Never done   Diabetic kidney evaluation - Urine ACR  07/04/2022   COVID-19 Vaccine (8 - 2023-24 season) 10/10/2022   INFLUENZA VACCINE  01/21/2023 (Originally 05/23/2022)   HEMOGLOBIN A1C  04/19/2023   OPHTHALMOLOGY EXAM  05/20/2023   Diabetic kidney evaluation - eGFR measurement  11/30/2023   Medicare Annual Wellness (AWV)  12/26/2023   Pneumonia Vaccine 82 Years old  Completed   HPV VACCINES  Aged Out    Health Maintenance  Health Maintenance Due  Topic Date Due   FOOT EXAM  Never done   DTaP/Tdap/Td (1 - Tdap) Never done   Zoster Vaccines- Shingrix (1 of 2) Never done   DEXA SCAN  Never done   Diabetic kidney evaluation - Urine ACR  07/04/2022   COVID-19 Vaccine (8 - 2023-24 season) 10/10/2022    Colorectal cancer screening: No longer required.   Mammogram status: No longer required due to age.  Bone Density status: Ordered 12/26/22. Pt provided with contact info and advised to call to schedule appt.  Lung Cancer Screening: (Low Dose CT Chest recommended if Age 667-80years, 30 pack-year currently smoking OR have quit w/in 15years.) does  not qualify.    Additional Screening:  Hepatitis C Screening: does not qualify; Completed no  Vision Screening: Recommended annual ophthalmology exams for early detection of glaucoma and other disorders of the eye. Is the patient up to date with their annual eye exam?  Yes  Who is the provider or what is the name of the office in which the patient attends annual eye exams? [facility] [place] If pt is not established with a provider, would they like to be referred to a provider to establish care? No .   Dental Screening: Recommended annual dental exams for proper oral hygiene  Community Resource Referral / Chronic Care Management: CRR required this  visit?  No   CCM required this visit?  No      Plan:     I have personally reviewed and noted the following in the patient's chart:   Medical and social history Use of alcohol, tobacco or illicit drugs  Current medications and supplements including opioid prescriptions. Patient is not currently taking opioid prescriptions. Functional ability and status Nutritional status Physical activity Advanced directives List of other physicians Hospitalizations, surgeries, and ER visits in previous 12 months Vitals Screenings to include cognitive, depression, and falls Referrals and appointments  In addition, I have reviewed and discussed with patient certain preventive protocols, quality metrics, and best practice recommendations. A written personalized care plan for preventive services as well as general preventive health recommendations were provided to patient.     Dionisio David, LPN   [id number]   Nurse Notes: none

## 2022-12-26 NOTE — Patient Instructions (Signed)
Vanessa Hogan , Thank you for taking time to come for your Medicare Wellness Visit. I appreciate your ongoing commitment to your health goals. Please review the following plan we discussed and let me know if I can assist you in the future.   These are the goals we discussed:  Goals      DIET - EAT MORE FRUITS AND VEGETABLES     Patient Stated     Would like to increase physical activity        This is a list of the screening recommended for you and due dates:  Health Maintenance  Topic Date Due   Complete foot exam   Never done   DTaP/Tdap/Td vaccine (1 - Tdap) Never done   Zoster (Shingles) Vaccine (1 of 2) Never done   DEXA scan (bone density measurement)  Never done   Yearly kidney health urinalysis for diabetes  07/04/2022   COVID-19 Vaccine (8 - 2023-24 season) 10/10/2022   Flu Shot  01/21/2023*   Hemoglobin A1C  04/19/2023   Eye exam for diabetics  05/20/2023   Yearly kidney function blood test for diabetes  11/30/2023   Medicare Annual Wellness Visit  12/26/2023   Pneumonia Vaccine  Completed   HPV Vaccine  Aged Out  *Topic was postponed. The date shown is not the original due date.    Advanced directives: no  Conditions/risks identified: none  Next appointment: Follow up in one year for your annual wellness visit 01/01/24 @ 10:00 am by phone   Preventive Care 65 Years and Older, Female Preventive care refers to lifestyle choices and visits with your health care provider that can promote health and wellness. What does preventive care include? A yearly physical exam. This is also called an annual well check. Dental exams once or twice a year. Routine eye exams. Ask your health care provider how often you should have your eyes checked. Personal lifestyle choices, including: Daily care of your teeth and gums. Regular physical activity. Eating a healthy diet. Avoiding tobacco and drug use. Limiting alcohol use. Practicing safe sex. Taking low-dose aspirin every  day. Taking vitamin and mineral supplements as recommended by your health care provider. What happens during an annual well check? The services and screenings done by your health care provider during your annual well check will depend on your age, overall health, lifestyle risk factors, and family history of disease. Counseling  Your health care provider may ask you questions about your: Alcohol use. Tobacco use. Drug use. Emotional well-being. Home and relationship well-being. Sexual activity. Eating habits. History of falls. Memory and ability to understand (cognition). Work and work Statistician. Reproductive health. Screening  You may have the following tests or measurements: Height, weight, and BMI. Blood pressure. Lipid and cholesterol levels. These may be checked every 5 years, or more frequently if you are over 83 years old. Skin check. Lung cancer screening. You may have this screening every year starting at age 107 if you have a 30-pack-year history of smoking and currently smoke or have quit within the past 15 years. Fecal occult blood test (FOBT) of the stool. You may have this test every year starting at age 46. Flexible sigmoidoscopy or colonoscopy. You may have a sigmoidoscopy every 5 years or a colonoscopy every 10 years starting at age 78. Hepatitis C blood test. Hepatitis B blood test. Sexually transmitted disease (STD) testing. Diabetes screening. This is done by checking your blood sugar (glucose) after you have not eaten for a while (fasting).  You may have this done every 1-3 years. Bone density scan. This is done to screen for osteoporosis. You may have this done starting at age 65. Mammogram. This may be done every 1-2 years. Talk to your health care provider about how often you should have regular mammograms. Talk with your health care provider about your test results, treatment options, and if necessary, the need for more tests. Vaccines  Your health care  provider may recommend certain vaccines, such as: Influenza vaccine. This is recommended every year. Tetanus, diphtheria, and acellular pertussis (Tdap, Td) vaccine. You may need a Td booster every 10 years. Zoster vaccine. You may need this after age 4. Pneumococcal 13-valent conjugate (PCV13) vaccine. One dose is recommended after age 36. Pneumococcal polysaccharide (PPSV23) vaccine. One dose is recommended after age 43. Talk to your health care provider about which screenings and vaccines you need and how often you need them. This information is not intended to replace advice given to you by your health care provider. Make sure you discuss any questions you have with your health care provider. Document Released: 11/05/2015 Document Revised: 06/28/2016 Document Reviewed: 08/10/2015 Elsevier Interactive Patient Education  2017 [place] Prevention in the Home Falls can cause injuries. They can happen to people of all ages. There are many things you can do to make your home safe and to help prevent falls. What can I do on the outside of my home? Regularly fix the edges of walkways and driveways and fix any cracks. Remove anything that might make you trip as you walk through a door, such as a raised step or threshold. Trim any bushes or trees on the path to your home. Use bright outdoor lighting. Clear any walking paths of anything that might make someone trip, such as rocks or tools. Regularly check to see if handrails are loose or broken. Make sure that both sides of any steps have handrails. Any raised decks and porches should have guardrails on the edges. Have any leaves, snow, or ice cleared regularly. Use sand or salt on walking paths during winter. Clean up any spills in your garage right away. This includes oil or grease spills. What can I do in the bathroom? Use night lights. Install grab bars by the toilet and in the tub and shower. Do not use towel bars as grab  bars. Use non-skid mats or decals in the tub or shower. If you need to sit down in the shower, use a plastic, non-slip stool. Keep the floor dry. Clean up any water that spills on the floor as soon as it happens. Remove soap buildup in the tub or shower regularly. Attach bath mats securely with double-sided non-slip rug tape. Do not have throw rugs and other things on the floor that can make you trip. What can I do in the bedroom? Use night lights. Make sure that you have a light by your bed that is easy to reach. Do not use any sheets or blankets that are too big for your bed. They should not hang down onto the floor. Have a firm chair that has side arms. You can use this for support while you get dressed. Do not have throw rugs and other things on the floor that can make you trip. What can I do in the kitchen? Clean up any spills right away. Avoid walking on wet floors. Keep items that you use a lot in easy-to-reach places. If you need to reach something above you, use a  strong step stool that has a grab bar. Keep electrical cords out of the way. Do not use floor polish or wax that makes floors slippery. If you must use wax, use non-skid floor wax. Do not have throw rugs and other things on the floor that can make you trip. What can I do with my stairs? Do not leave any items on the stairs. Make sure that there are handrails on both sides of the stairs and use them. Fix handrails that are broken or loose. Make sure that handrails are as long as the stairways. Check any carpeting to make sure that it is firmly attached to the stairs. Fix any carpet that is loose or worn. Avoid having throw rugs at the top or bottom of the stairs. If you do have throw rugs, attach them to the floor with carpet tape. Make sure that you have a light switch at the top of the stairs and the bottom of the stairs. If you do not have them, ask someone to add them for you. What else can I do to help prevent  falls? Wear shoes that: Do not have high heels. Have rubber bottoms. Are comfortable and fit you well. Are closed at the toe. Do not wear sandals. If you use a stepladder: Make sure that it is fully opened. Do not climb a closed stepladder. Make sure that both sides of the stepladder are locked into place. Ask someone to hold it for you, if possible. Clearly mark and make sure that you can see: Any grab bars or handrails. First and last steps. Where the edge of each step is. Use tools that help you move around (mobility aids) if they are needed. These include: Canes. Walkers. Scooters. Crutches. Turn on the lights when you go into a dark area. Replace any light bulbs as soon as they burn out. Set up your furniture so you have a clear path. Avoid moving your furniture around. If any of your floors are uneven, fix them. If there are any pets around you, be aware of where they are. Review your medicines with your doctor. Some medicines can make you feel dizzy. This can increase your chance of falling. Ask your doctor what other things that you can do to help prevent falls. This information is not intended to replace advice given to you by your health care provider. Make sure you discuss any questions you have with your health care provider. Document Released: 08/05/2009 Document Revised: 03/16/2016 Document Reviewed: 11/13/2014 Elsevier Interactive Patient Education  2017 [employer].

## 2022-12-27 ENCOUNTER — Telehealth: Payer: Self-pay | Admitting: *Deleted

## 2022-12-27 NOTE — Progress Notes (Signed)
  Care Coordination   Note   12/27/2022 Name: Vanessa Hogan MRN: [id number] DOB: 06-06-1941  Vanessa Hogan is a 82 y.o. year old female who sees Valerie Roys, DO for primary care. I reached out to [facility] by phone today in response to a referral   Follow up plan:  Telephone appointment scheduled for:  01/02/2023  Encounter Outcome:  Pt. Scheduled from referral

## 2022-12-28 ENCOUNTER — Ambulatory Visit: Payer: Medicare PPO | Admitting: Family Medicine

## 2022-12-28 ENCOUNTER — Encounter: Payer: Self-pay | Admitting: Family Medicine

## 2022-12-28 VITALS — BP 150/84 | HR 88 | Temp 98.6°F | Ht 65.0 in | Wt 211.8 lb

## 2022-12-28 DIAGNOSIS — Z23 Encounter for immunization: Secondary | ICD-10-CM | POA: Diagnosis not present

## 2022-12-28 DIAGNOSIS — S80812A Abrasion, left lower leg, initial encounter: Secondary | ICD-10-CM | POA: Diagnosis not present

## 2022-12-28 DIAGNOSIS — I10 Essential (primary) hypertension: Secondary | ICD-10-CM | POA: Diagnosis not present

## 2022-12-28 DIAGNOSIS — E1169 Type 2 diabetes mellitus with other specified complication: Secondary | ICD-10-CM

## 2022-12-28 DIAGNOSIS — E785 Hyperlipidemia, unspecified: Secondary | ICD-10-CM

## 2022-12-28 DIAGNOSIS — Z794 Long term (current) use of insulin: Secondary | ICD-10-CM

## 2022-12-28 DIAGNOSIS — E1165 Type 2 diabetes mellitus with hyperglycemia: Secondary | ICD-10-CM | POA: Diagnosis not present

## 2022-12-28 LAB — BAYER DCA HB A1C WAIVED: HB A1C (BAYER DCA - WAIVED): 7.4 % — ABNORMAL HIGH (ref 4.8–5.6)

## 2022-12-28 LAB — MICROALBUMIN, URINE WAIVED
Creatinine, Urine Waived: 50 mg/dL (ref 10–300)
Microalb, Ur Waived: 150 mg/L — ABNORMAL HIGH (ref 0–19)
Microalb/Creat Ratio: >300 mg/g — ABNORMAL HIGH (ref <30)

## 2022-12-28 MED ORDER — CLONIDINE HCL 0.2 MG PO TABS: 0.2000 mg | ORAL_TABLET | Freq: Every day | ORAL | 1 refills | Status: DC

## 2022-12-28 NOTE — Progress Notes (Signed)
BP (!) 150/84 (BP Location: Left Arm)   Pulse 88   Temp 98.6 F (37 C) (Oral)   Ht 5\' 5"  (1.651 m)   Wt 211 lb 12.8 oz (96.1 kg)   SpO2 97%   BMI 35.25 kg/m    Subjective:    Patient ID: Vanessa Hogan, female    DOB: 1940-12-04, 82 y.o.   MRN: [id number]  HPI: Vanessa Hogan is a 82 y.o. female  Chief Complaint  Patient presents with   Diabetes   DIABETES Hypoglycemic episodes:no Polydipsia/polyuria: no Visual disturbance: no Chest pain: no Paresthesias: no Glucose Monitoring: yes  Fasting glucose: 141 Taking Insulin?: yes Blood Pressure Monitoring: not checking Retinal Examination: Up to Date Foot Exam: Up to Date Diabetic Education: Completed Pneumovax: Up to Date Influenza: Up to Date Aspirin: no  HYPERTENSION / HYPERLIPIDEMIA Satisfied with current treatment? yes Duration of hypertension: chronic BP monitoring frequency: not checking BP medication side effects: no Past BP meds: clonidine, vasartan, HCTZ,  Duration of hyperlipidemia: chronic Cholesterol medication side effects: no Cholesterol supplements: none Past cholesterol medications: crestor Medication compliance: excellent compliance Aspirin: no Recent stressors: no Recurrent headaches: no Visual changes: no Palpitations: no Dyspnea: no Chest pain: no Lower extremity edema: no Dizzy/lightheaded: no   Relevant past medical, surgical, family and social history reviewed and updated as indicated. Interim medical history since our last visit reviewed. Allergies and medications reviewed and updated.  Review of Systems  Constitutional: Negative.   Respiratory: Negative.    Cardiovascular: Negative.   Gastrointestinal: Negative.   Musculoskeletal: Negative.   Neurological: Negative.   Psychiatric/Behavioral: Negative.      Per HPI unless specifically indicated above     Objective:    BP (!) 150/84 (BP Location: Left Arm)   Pulse 88   Temp 98.6 F (37 C) (Oral)    Ht 5\' 5"  (1.651 m)   Wt 211 lb 12.8 oz (96.1 kg)   SpO2 97%   BMI 35.25 kg/m   Wt Readings from Last 3 Encounters:  12/28/22 211 lb 12.8 oz (96.1 kg)  12/26/22 217 lb (98.4 kg)  11/29/22 217 lb 6.4 oz (98.6 kg)    Physical Exam Vitals and nursing note reviewed.  Constitutional:      General: She is not in acute distress.    Appearance: Normal appearance. She is not ill-appearing, toxic-appearing or diaphoretic.  HENT:     Head: Normocephalic and atraumatic.     Right Ear: External ear normal.     Left Ear: External ear normal.     Nose: Nose normal.     Mouth/Throat:     Mouth: Mucous membranes are moist.     Pharynx: Oropharynx is clear.  Eyes:     General: No scleral icterus.       Right eye: No discharge.        Left eye: No discharge.     Extraocular Movements: Extraocular movements intact.     Conjunctiva/sclera: Conjunctivae normal.     Pupils: Pupils are equal, round, and reactive to light.  Cardiovascular:     Rate and Rhythm: Normal rate and regular rhythm.     Pulses: Normal pulses.     Heart sounds: Normal heart sounds. No murmur heard.    No friction rub. No gallop.  Pulmonary:     Effort: Pulmonary effort is normal. No respiratory distress.     Breath sounds: Normal breath sounds. No stridor. No wheezing, rhonchi or rales.  Chest:  Chest wall: No tenderness.  Musculoskeletal:        General: Normal range of motion.     Cervical back: Normal range of motion and neck supple.  Skin:    General: Skin is warm and dry.     Capillary Refill: Capillary refill takes less than 2 seconds.     Coloration: Skin is not jaundiced or pale.     Findings: No bruising, erythema, lesion or rash.     Comments: Well healing wound of L lower leg  Neurological:     General: No focal deficit present.     Mental Status: She is alert and oriented to person, place, and time. Mental status is at baseline.  Psychiatric:        Mood and Affect: Mood normal.        Behavior:  Behavior normal.        Thought Content: Thought content normal.        Judgment: Judgment normal.     Results for orders placed or performed in visit on 12/28/22  Bayer DCA Hb A1c Waived  Result Value Ref Range   HB A1C (BAYER DCA - WAIVED) 7.4 (H) 4.8 - 5.6 %  Microalbumin, Urine Waived  Result Value Ref Range   Microalb, Ur Waived 150 (H) 0 - 19 mg/L   Creatinine, Urine Waived 50 10 - 300 mg/dL   Microalb/Creat Ratio >300 (H) <30 mg/g      Assessment & Plan:   Problem List Items Addressed This Visit       Cardiovascular and Mediastinum   Essential hypertension (Chronic)    Not under good control. Was not taking Clonidine as prescribed. Start back taking 0.2 mg of Clonidine as scheduled, take Valsartan-Hctz 320-25 mg once daily. Microalbumin done today.       Relevant Medications   cloNIDine (CATAPRES) 0.2 MG tablet   Other Relevant Orders   Urine Microalbumin w/creat. ratio     Endocrine   Type 2 diabetes mellitus with hyperglycemia, with long-term current use of insulin (HCC) - Primary (Chronic)    Chronic, ongoing. Recent A1c 7.1. Labs rechecked today. Continue Lispro x2 daily with sliding scale, and checking and recording BG readings. Encouraged to complete and return to pharmacy insurance form for assistance with affording [place]. Return in 3 months for A1c check.        Relevant Orders   Bayer DCA Hb A1c Waived (Completed)   Urine Microalbumin w/creat. ratio   Hyperlipidemia associated with type 2 diabetes mellitus (HCC)    Chronic, stable. Continue taking Rosuvastatin as prescribed. Will recheck labs at next visit.       Relevant Medications   cloNIDine (CATAPRES) 0.2 MG tablet   Other Visit Diagnoses     Scratch of left lower leg       Healing well. Due for Td. Given today.   Relevant Orders   Td : Tetanus/diphtheria >7yo Preservative  free (Completed)        Follow up plan: Return in about 3 months (around 03/30/2023) for diabetes/ HTN/  HLD.

## 2022-12-28 NOTE — Assessment & Plan Note (Signed)
Chronic, stable. Continue taking Rosuvastatin as prescribed. Will recheck labs at next visit.

## 2022-12-28 NOTE — Assessment & Plan Note (Addendum)
Chronic, ongoing. Recent A1c 7.1. Labs rechecked today. Continue Lispro x2 daily with sliding scale, and checking and recording BG readings. Encouraged to complete and return to pharmacy insurance form for assistance with affording [place]. Return in 3 months for A1c check.

## 2022-12-28 NOTE — Progress Notes (Signed)
BP (!) 150/84 (BP Location: Left Arm)   Pulse 88   Temp 98.6 F (37 C) (Oral)   Ht '5\' 5"'$  (1.651 m)   Wt 211 lb 12.8 oz (96.1 kg)   SpO2 97%   BMI 35.25 kg/m    Subjective:    Patient ID: Vanessa Hogan, female    DOB: 11/04/1940, 82 y.o.   MRN: [id number]  HPI: Vanessa Hogan is a 82 y.o. female  Chief Complaint  Patient presents with   Diabetes   HYPERTENSION without Chronic Kidney Disease Recheck 150/84 Taking Valsartan-Hydrochlorothiazide 320-25 mg tablet; sharp chest pains she was having previously have resided.  Hypertension status: controlled  Satisfied with current treatment? yes Duration of hypertension: chronic BP monitoring frequency:  weekly BP range: 140-150/82-84 BP medication side effects:  no Medication compliance: excellent compliance Aspirin: no Recurrent headaches: no Visual changes: no Palpitations: no Dyspnea: no Chest pain: no Lower extremity edema: yes Dizzy/lightheaded: no   DIABETES Taking Metformin on a as needed basis, if numbers get high she will take it for a few days, with no side effects. She has not been able to take her Ozempic d/t financial reasons and has not returned Pharmacy form to help with this. She is taking insulin x2 day with sliding scale. Hypoglycemic episodes:no Polydipsia/polyuria: no Visual disturbance: no Chest pain: no Paresthesias: no Glucose Monitoring: yes  Accucheck frequency: Daily  Fasting glucose:130s-140s  Evening: 152s-170s Taking Insulin?: yes  Long acting insulin:No  Short acting insulin: Lispro x2 daily with sliding scale Blood Pressure Monitoring: weekly Retinal Examination: Up to Date Foot Exam: Up to Date Diabetic Education: Completed Pneumovax: Up to Date Influenza: Not up to Date Aspirin: no   Relevant past medical, surgical, family and social history reviewed and updated as indicated. Interim medical history since our last visit reviewed. Allergies and medications  reviewed and updated.  Review of Systems  Constitutional: Negative.   Eyes: Negative.  Negative for visual disturbance.  Respiratory: Negative.    Cardiovascular:  Positive for leg swelling. Negative for chest pain and palpitations.  Endocrine: Positive for polydipsia. Negative for polyphagia and polyuria.  Genitourinary: Negative.  Negative for frequency.  Neurological:  Negative for numbness and headaches.    Per HPI unless specifically indicated above     Objective:    BP (!) 150/84 (BP Location: Left Arm)   Pulse 88   Temp 98.6 F (37 C) (Oral)   Ht '5\' 5"'$  (1.651 m)   Wt 211 lb 12.8 oz (96.1 kg)   SpO2 97%   BMI 35.25 kg/m   Wt Readings from Last 3 Encounters:  12/28/22 211 lb 12.8 oz (96.1 kg)  12/26/22 217 lb (98.4 kg)  11/29/22 217 lb 6.4 oz (98.6 kg)    Physical Exam Vitals and nursing note reviewed.  Constitutional:      General: She is awake. She is not in acute distress.    Appearance: Normal appearance. She is well-developed and well-groomed. She is not ill-appearing.  HENT:     Head: Normocephalic and atraumatic.     Right Ear: Hearing and external ear normal. No drainage.     Left Ear: Hearing and external ear normal. No drainage.     Nose: Nose normal.  Eyes:     General: Lids are normal.        Right eye: No discharge.        Left eye: No discharge.     Conjunctiva/sclera: Conjunctivae normal.  Cardiovascular:  Rate and Rhythm: Normal rate and regular rhythm.     Pulses:          Carotid pulses are 2+ on the right side and 2+ on the left side.      Dorsalis pedis pulses are 2+ on the right side and 2+ on the left side.       Posterior tibial pulses are 2+ on the right side and 2+ on the left side.     Heart sounds: Normal heart sounds, S1 normal and S2 normal. No murmur heard.    No gallop.  Pulmonary:     Effort: Pulmonary effort is normal. No accessory muscle usage or respiratory distress.     Breath sounds: Normal breath sounds.   Musculoskeletal:        General: Normal range of motion.     Cervical back: Full passive range of motion without pain and normal range of motion.     Right lower leg: No edema.     Left lower leg: No edema.  Feet:     Right foot:     Skin integrity: Skin integrity normal.     Toenail Condition: Right toenails are abnormally thick.     Left foot:     Skin integrity: Ulcer and dry skin present.     Toenail Condition: Left toenails are abnormally thick.     Comments: Dry 1cm crusted ulcer at interior left ankle.   Skin:    General: Skin is warm and dry.     Capillary Refill: Capillary refill takes less than 2 seconds.  Neurological:     Mental Status: She is alert and oriented to person, place, and time.  Psychiatric:        Attention and Perception: Attention normal.        Mood and Affect: Mood normal.        Speech: Speech normal.        Behavior: Behavior normal. Behavior is cooperative.        Thought Content: Thought content normal.     Results for orders placed or performed in visit on 11/29/22  Sed Rate (ESR)  Result Value Ref Range   Sed Rate 16 0 - 40 mm/hr  Basic metabolic panel  Result Value Ref Range   Glucose 162 (H) 70 - 99 mg/dL   BUN 9 8 - 27 mg/dL   Creatinine, Ser 0.88 0.57 - 1.00 mg/dL   eGFR 66 >59 mL/min/1.73   BUN/Creatinine Ratio 10 (L) 12 - 28   Sodium 142 134 - 144 mmol/L   Potassium 4.2 3.5 - 5.2 mmol/L   Chloride 103 96 - 106 mmol/L   CO2 26 20 - 29 mmol/L   Calcium 9.5 8.7 - 10.3 mg/dL  Specimen status report  Result Value Ref Range   specimen status report Comment   H. pylori antigen, stool  Result Value Ref Range   H pylori Ag, Stl Negative Negative      Assessment & Plan:   Problem List Items Addressed This Visit       Cardiovascular and Mediastinum   Essential hypertension (Chronic)    Not under good control. Was not taking Clonidine as prescribed. Start back taking 0.2 mg of Clonidine as scheduled, take Valsartan-Hctz 320-25  mg once daily. Microalbumin done today.       Relevant Medications   cloNIDine (CATAPRES) 0.2 MG tablet   Other Relevant Orders   Urine Microalbumin w/creat. ratio     Endocrine  Type 2 diabetes mellitus with hyperglycemia, with long-term current use of insulin (HCC) - Primary (Chronic)    Chronic, ongoing. Recent A1c 7.1. Labs rechecked today. Continue Lispro x2 daily with sliding scale, and checking and recording BG readings. Encouraged to complete and return to pharmacy insurance form for assistance with affording [place]. Return in 3 months for A1c check.        Relevant Orders   Bayer DCA Hb A1c Waived   Urine Microalbumin w/creat. ratio   Hyperlipidemia associated with type 2 diabetes mellitus (HCC)    Chronic, stable. Continue taking Rosuvastatin as prescribed. Will recheck labs at next visit.       Relevant Medications   cloNIDine (CATAPRES) 0.2 MG tablet   Other Visit Diagnoses     Scratch of left lower leg       Relevant Orders   Td : Tetanus/diphtheria >7yo Preservative  free (Completed)        Follow up plan: Return in about 3 months (around 03/30/2023) for diabetes/ HTN/ HLD.

## 2022-12-28 NOTE — Assessment & Plan Note (Signed)
Not under good control. Was not taking Clonidine as prescribed. Start back taking 0.2 mg of Clonidine as scheduled, take Valsartan-Hctz 320-25 mg once daily. Microalbumin done today.

## 2023-01-02 ENCOUNTER — Ambulatory Visit: Payer: Self-pay | Admitting: *Deleted

## 2023-01-02 ENCOUNTER — Encounter: Payer: Self-pay | Admitting: Family Medicine

## 2023-01-02 DIAGNOSIS — Z794 Long term (current) use of insulin: Secondary | ICD-10-CM

## 2023-01-02 NOTE — Patient Outreach (Signed)
  Care Coordination   Initial Visit Note   01/02/2023 Name: Wyona Neils MRN: [id number] DOB: Mar 07, 1941  Maud Deed Criado is a 82 y.o. year old female who sees Valerie Roys, DO for primary care. I spoke with  Watt Climes by phone today.  What matters to the patients health and wellness today?  Affording medications and food bank resources    Goals Addressed             This Visit's Progress    Patient requesting food bank resources, concerned about medication affordability       Care Coordination Interventions: Food Bank resources to be mailed to patient's home Referral made to [place] Team to assist with medication assistance programs          SDOH assessments and interventions completed:  Yes  SDOH Interventions Today    Flowsheet Row Most Recent Value  SDOH Interventions   Housing Interventions Intervention Not Indicated  Transportation Interventions Intervention Not Indicated  Financial Strain Interventions Other (Comment)  [referral place to pharmacy team for  medicines]  Physical Activity Interventions Intervention Not Indicated        Care Coordination Interventions:  Yes, provided  Interventions Today    Flowsheet Row Most Recent Value  Chronic Disease   Chronic disease during today's visit Diabetes  General Interventions   General Interventions Discussed/Reviewed Community Resources  Nutrition Interventions   Nutrition Discussed/Reviewed Nutrition Discussed  [Will send list of food bank resources to patient's home]  Pharmacy Interventions   Pharmacy Dicussed/Reviewed Pharmacy Topics Discussed, Affording Medications, Referral to Pharmacist  Referral to Pharmacist Cannot afford medications  [patient states cannot afford ozempic prescription]       Follow up plan: Follow up call scheduled for 01/16/23    Encounter Outcome:  Pt. Visit Completed

## 2023-01-02 NOTE — Patient Instructions (Signed)
Visit Information  Thank you for taking time to visit with me today. Please don't hesitate to contact me if I can be of assistance to you.   Following are the goals we discussed today:   Goals Addressed             This Visit's Progress    Patient requesting food bank resources, concerned about medication affordability       Care Coordination Interventions: Food Bank resources to be mailed to patient's home Referral made to [place] Team to assist with medication assistance programs          Our next appointment is by telephone on 01/16/23 at 10am  Please call the care guide team at [phone] if you need to cancel or reschedule your appointment.   If you are experiencing a Mental Health or [place] or need someone to talk to, please call the Suicide and Crisis Lifeline: 988   The patient verbalized understanding of instructions, educational materials, and care plan provided today and DECLINED offer to receive copy of patient instructions, educational materials, and care plan.   Telephone follow up appointment with care management team member scheduled for: 01/16/23 Elliot Gurney, [place] Worker  [facility] Care Management [phone]

## 2023-01-05 ENCOUNTER — Telehealth: Payer: Self-pay

## 2023-01-08 ENCOUNTER — Other Ambulatory Visit: Payer: Self-pay | Admitting: Family Medicine

## 2023-01-08 DIAGNOSIS — E119 Type 2 diabetes mellitus without complications: Secondary | ICD-10-CM

## 2023-01-09 NOTE — Progress Notes (Signed)
   01/09/2023  Patient ID: Vanessa Hogan, female   DOB: [birth year], 82 y.o.   MRN: [id number]  Subjective/Objective: Telephone visit to follow-up with Vanessa Hogan on status of PAP application for Ozempic and check on home BG and BP readings.  DM -patient states PAP application for Ozempic is not current and new one is needed -not taking metformin regularly due to reported dizziness and GI upset -currently only using Humalog Mix 75/25 regularly- 18 units in the morning and 20 units in the evening -states FBG usually around 125 -A1c on 2/7 7.4, slightly up from 1 month ago  HTN -valsartan/hctz increased on 2/7 to 320/25mg  daily -clonidine 0.2mg  daily restarted, but patient did not take after first dose or two- states medication caused headache, which had occurred previously when taking -not checking home BP regularly  HLD -12/28/21 lipids: TC 204, TG 161, HDL 38, LDL 187 -currently taking rosuvastatin 10mg  daily  Assessment/Plan:  DM -asking CPhT with medication assistance team to send new Ozempic PAP application to patient -checking to see if Dr. Durenda Age office has Ozempic samples in the mean time, as patient likely needs additional therapy to insulin since she is not taking metformin regularly -educated patient to check blood glucose at least once daily at home and record  HTN -recommended checking home BP at least 2-3x/wk if not daily to evaluate efficacy of valsartan/hctz dose increase- she was in agreement and will record values -since patient cannot tolerate clonidine 0.2mg , need to consider alternative if BP remains >130/80 consistently- could try amlodipine 5mg  daily  HLD -lipid panel due; recommend to check in 3 mos along with A1c and CMP -if LDL >70, recommend high intensity statin- rosuvastatin 20mg  daily   Follow Up:  Telephone follow-up to check on home BP and BP Friday 3/22  Darlina Guys, PharmD, DPLA

## 2023-01-09 NOTE — Telephone Encounter (Signed)
Requested Prescriptions  Pending Prescriptions Disp Refills   Insulin Lispro Prot & Lispro (HUMALOG MIX 75/25 KWIKPEN) (75-25) 100 UNIT/ML [employer] Name: HUMALOG MIX 75/25 KWIKPEN (75-25) 1] 15 mL 0    Sig: INJECT 18 UNITS SUBCUTANEOUSLY EVERY MORNING AND 20 UNITS EVERY EVENING     Endocrinology:  Diabetes - Insulins Passed - 01/08/2023 11:46 AM      Passed - HBA1C is between 0 and 7.9 and within 180 days    HB A1C (BAYER DCA - WAIVED)  Date Value Ref Range Status  12/28/2022 7.4 (H) 4.8 - 5.6 % Final    Comment:             Prediabetes: 5.7 - 6.4          Diabetes: >6.4          Glycemic control for adults with diabetes: <7.0          Passed - Valid encounter within last 6 months    Recent Outpatient Visits           1 week ago Type 2 diabetes mellitus with hyperglycemia, with long-term current use of insulin ([place])   [place], Megan P, DO   1 month ago Controlled type 2 diabetes mellitus with microalbuminuria, with long-term current use of insulin ([place])   [place], Megan P, DO   2 months ago Essential hypertension   [place], [place], DO   2 months ago [place] P, DO   3 months ago Type 2 diabetes mellitus with hyperglycemia, with long-term current use of insulin ([place])   [place], PA-C       Future Appointments             In 2 months Wynetta Emery, Barb Merino, DO [place], [facility]

## 2023-01-10 ENCOUNTER — Telehealth: Payer: Self-pay

## 2023-01-10 NOTE — Telephone Encounter (Signed)
PAP application for OZEMPIC/NOVO NORDISK  has been mailed to pt home. I will fax PCP pages once I receive pt pages.  Suanne Minahan L. CPhT Rx Patient [facility]ocate (O) [phone] (F) [phone]    

## 2023-01-12 ENCOUNTER — Other Ambulatory Visit: Payer: Medicare PPO

## 2023-01-12 ENCOUNTER — Telehealth: Payer: Self-pay | Admitting: Family Medicine

## 2023-01-12 ENCOUNTER — Other Ambulatory Visit: Payer: Self-pay

## 2023-01-12 DIAGNOSIS — E119 Type 2 diabetes mellitus without complications: Secondary | ICD-10-CM

## 2023-01-12 MED ORDER — INSULIN LISPRO PROT & LISPRO (75-25 MIX) 100 UNIT/ML KWIKPEN: PEN_INJECTOR | SUBCUTANEOUS | 0 refills | Status: DC

## 2023-01-12 NOTE — Telephone Encounter (Signed)
Copied from [place] [id number]. Topic: General - Other >> Jan 12, 2023 12:18 PM Everette C wrote: Reason for CRM: The patient was told by their pharmacy that additional approval and was needed for the price of their Insulin Lispro Prot & Lispro (HUMALOG MIX 75/25 KWIKPEN) (75-25) 100 UNIT/ML Claiborne Rigg [id number] to be $35   The patient was directed by their pharmacy to contact their PCP and request that modifications to the prescription and coding be made   Please contact the patient and their pharmacy further when possible

## 2023-01-12 NOTE — Telephone Encounter (Signed)
Spoke with Pharmacy Tech at [facility] and was informed that the patient prescription for Humalog Mix should read in the comments section " INJECT 18 UNITS SUBCUTANEOUSLY EVERY MORNING AND 20 UNITS EVERY EVENING (UP TO 50 UNITS PER DAY MAX) " in order for patient to avoid paying two co-payments. Prescription for Humalog with requested information was resent to patient's local pharmacy [facility].

## 2023-01-12 NOTE — Progress Notes (Signed)
   01/12/2023  Patient ID: Watt Climes, female   DOB: 1940-12-27, 82 y.o.   MRN: [id number]  Subjective/Objective: Telephone follow up visit to check on status of Ozempic PAP and home BG and BP  DM -[place] PAP application for Ozempic was mailed to patient 3/20, so she has not received yet -FBG this week ranged 131-161 and post-prandial ranged 189-329 -In February when patient was consistently on Ozempic, FBG consistently 140's and post-prandial no higher than 190  BP -Only took home BP once this week: 160/82  -Not taking clonidine 0.2mg  still due to association with headache  Assessment/Plan:  DM -Contacting office to see if they have Ozempic sample or any GLP1 patient can get month supply of -Patient will be on the lookout for PAP application and fill out ASAP  HTN -Recommend patient continue to check BP and increase frequency to 3x/wk if not daily -Likely needs additional therapy since not taking clonidine.  Appears to have tolerated labetalol in the past, and unclear why this was discontinued, but we could consider restarting therapy to get patient consistently <140/90  Darlina Guys, PharmD, DPLA

## 2023-01-15 ENCOUNTER — Telehealth: Payer: Self-pay

## 2023-01-15 NOTE — Telephone Encounter (Signed)
-----   Message from Valerie Roys, DO sent at 01/14/2023  7:52 AM EDT ----- OK to let her know to come pick up an Ozempic- I'll sign it out for her.  ----- Message ----- From: Darlina Guys, [facility] Sent: 01/12/2023   4:01 PM EDT To: Valerie Roys, DO  Dr. Wynetta Emery,  Do you all have a 1 month sample of Ozempic or any GLP1 Ms. [place] could get until Ozempic PAP is approved and received?  Thank you!  Darlina Guys, PharmD, DPLA

## 2023-01-15 NOTE — Telephone Encounter (Signed)
Spoke with patient to make her aware of Dr Durenda Age recommendations. Patient verbalized understanding and says she will try and stop by today.

## 2023-01-16 ENCOUNTER — Ambulatory Visit: Payer: Self-pay | Admitting: *Deleted

## 2023-01-16 NOTE — Patient Instructions (Signed)
Visit Information  Thank you for taking time to visit with me today. Please don't hesitate to contact me if I can be of assistance to you.   Following are the goals we discussed today:   Goals Addressed             This Visit's Progress    DIET - EAT MORE FRUITS AND VEGETABLES       COMPLETED: Patient requesting food bank resources, concerned about medication affordability       Care Coordination Interventions: Food Bank resources mailed to patient's home received and will be reviewed Patient now active with the [place] Team to assist with medication assistance programs           If you are experiencing a Mental Health or [place] or need someone to talk to, please call 911   Patient verbalizes understanding of instructions and care plan provided today and agrees to view in [place]. Active MyChart status and patient understanding of how to access instructions and care plan via MyChart confirmed with patient.     No further follow up required: patient will complete medication assistance application mailed to her by the central pharmacy team  [facility], [place] Worker  [facility] Care Management [phone]

## 2023-01-16 NOTE — Patient Outreach (Signed)
  Care Coordination   Follow Up Visit Note   01/16/2023 Name: Vanessa Hogan MRN: [id number] DOB: 05/01/1941  Vanessa Hogan is a 82 y.o. year old female who sees Valerie Roys, DO for primary care. I spoke with  Watt Climes by phone today.  What matters to the patients health and wellness today?  Food bank resources and medication assistance    Goals Addressed             This Visit's Progress    DIET - EAT MORE FRUITS AND VEGETABLES       COMPLETED: Patient requesting food bank resources, concerned about medication affordability       Care Coordination Interventions: Food Bank resources mailed to patient's home received and will be reviewed Patient now active with the [facility] Team to assist with medication assistance programs          SDOH assessments and interventions completed:       Care Coordination Interventions:  Yes, provided  Interventions Today    Flowsheet Row Most Recent Value  Chronic Disease   Chronic disease during today's visit Diabetes  General Interventions   General Interventions Discussed/Reviewed General Interventions Reviewed, Community Resources  Nutrition Interventions   Nutrition Discussed/Reviewed Nutrition Reviewed  [confirmed that food bank resources were received by mail]  Pharmacy Interventions   Pharmacy Dicussed/Reviewed Affording Medications  [patient confirmed that she is now activley working with the pharmacy team regarding medication assistance for Ozempic-patient picked up a sample from her providers office as well]       Follow up plan: No further intervention required.   Encounter Outcome:  Pt. Visit Completed

## 2023-01-17 ENCOUNTER — Other Ambulatory Visit: Payer: Self-pay | Admitting: Family Medicine

## 2023-01-17 NOTE — Telephone Encounter (Signed)
Rx dose was increased on 11/29/22 #90/1 RF sent to pharmacy.   Requested Prescriptions  Refused Prescriptions Disp Refills   valsartan-hydrochlorothiazide (DIOVAN-HCT) 160-12.5 MG tablet [Pharmacy Med Name: VALSARTAN-HCTZ 160-12.5 MG TAB] 90 tablet     Sig: TAKE 1 TABLET BY MOUTH DAILY     Cardiovascular: ARB + Diuretic Combos Failed - 01/17/2023 10:12 AM      Failed - Last BP in normal range    BP Readings from Last 1 Encounters:  12/28/22 (!) 150/84         Passed - K in normal range and within 180 days    Potassium  Date Value Ref Range Status  11/29/2022 4.2 3.5 - 5.2 mmol/L Final         Passed - Na in normal range and within 180 days    Sodium  Date Value Ref Range Status  11/29/2022 142 134 - 144 mmol/L Final         Passed - Cr in normal range and within 180 days    Creatinine, Ser  Date Value Ref Range Status  11/29/2022 0.88 0.57 - 1.00 mg/dL Final         Passed - eGFR is 10 or above and within 180 days    GFR, Estimated  Date Value Ref Range Status  10/20/2022 >60 >60 mL/min Final    Comment:    (NOTE) Calculated using the CKD-EPI Creatinine Equation (2021)    eGFR  Date Value Ref Range Status  11/29/2022 66 >59 mL/min/1.73 Final         Passed - Patient is not pregnant      Passed - Valid encounter within last 6 months    Recent Outpatient Visits           2 weeks ago Type 2 diabetes mellitus with hyperglycemia, with long-term current use of insulin ([place])   [place], Megan P, DO   1 month ago Controlled type 2 diabetes mellitus with microalbuminuria, with long-term current use of insulin ([place])   [place], Megan P, DO   2 months ago Essential hypertension   [place], [place], DO   3 months ago [place], Megan P, DO   3 months ago Type 2 diabetes mellitus with hyperglycemia, with long-term  current use of insulin ([place])   [place], Dani Gobble, PA-C       Future Appointments             In 2 months Wynetta Emery, Barb Merino, DO [place]

## 2023-01-28 ENCOUNTER — Emergency Department: Payer: Medicare PPO

## 2023-01-28 ENCOUNTER — Emergency Department
Admission: EM | Admit: 2023-01-28 | Discharge: 2023-01-28 | Disposition: A | Discharge status: Home / Self Care | Payer: Medicare PPO | Attending: Emergency Medicine | Admitting: Emergency Medicine

## 2023-01-28 ENCOUNTER — Other Ambulatory Visit: Payer: Self-pay

## 2023-01-28 DIAGNOSIS — N390 Urinary tract infection, site not specified: Secondary | ICD-10-CM | POA: Diagnosis not present

## 2023-01-28 DIAGNOSIS — R109 Unspecified abdominal pain: Secondary | ICD-10-CM

## 2023-01-28 DIAGNOSIS — E876 Hypokalemia: Secondary | ICD-10-CM | POA: Diagnosis not present

## 2023-01-28 DIAGNOSIS — D72829 Elevated white blood cell count, unspecified: Secondary | ICD-10-CM | POA: Diagnosis not present

## 2023-01-28 DIAGNOSIS — I10 Essential (primary) hypertension: Secondary | ICD-10-CM | POA: Diagnosis not present

## 2023-01-28 DIAGNOSIS — R55 Syncope and collapse: Secondary | ICD-10-CM | POA: Insufficient documentation

## 2023-01-28 DIAGNOSIS — I499 Cardiac arrhythmia, unspecified: Secondary | ICD-10-CM | POA: Diagnosis not present

## 2023-01-28 DIAGNOSIS — Z79899 Other long term (current) drug therapy: Secondary | ICD-10-CM | POA: Diagnosis not present

## 2023-01-28 DIAGNOSIS — E119 Type 2 diabetes mellitus without complications: Secondary | ICD-10-CM | POA: Insufficient documentation

## 2023-01-28 LAB — URINALYSIS, ROUTINE W REFLEX MICROSCOPIC
Bacteria, UA: NONE SEEN
Bilirubin Urine: NEGATIVE
Glucose, UA: NEGATIVE mg/dL
Hgb urine dipstick: NEGATIVE
Ketones, ur: NEGATIVE mg/dL
Nitrite: NEGATIVE
Protein, ur: NEGATIVE mg/dL
Specific Gravity, Urine: 1.011 (ref 1.005–1.030)
pH: 6 (ref 5.0–8.0)

## 2023-01-28 LAB — BASIC METABOLIC PANEL
Anion gap: 10 (ref 5–15)
BUN: 11 mg/dL (ref 8–23)
CO2: 26 mmol/L (ref 22–32)
Calcium: 9.4 mg/dL (ref 8.9–10.3)
Chloride: 102 mmol/L (ref 98–111)
Creatinine, Ser: 0.85 mg/dL (ref 0.44–1.00)
GFR, Estimated: >60 mL/min (ref >60)
Glucose, Bld: 110 mg/dL — ABNORMAL HIGH (ref 70–99)
Potassium: 3 mmol/L — ABNORMAL LOW (ref 3.5–5.1)
Sodium: 138 mmol/L (ref 135–145)

## 2023-01-28 LAB — CBC
HCT: 44.9 % (ref 36.0–46.0)
Hemoglobin: 14 g/dL (ref 12.0–15.0)
MCH: 24.3 pg — ABNORMAL LOW (ref 26.0–34.0)
MCHC: 31.2 g/dL (ref 30.0–36.0)
MCV: 77.8 fL — ABNORMAL LOW (ref 80.0–100.0)
Platelets: 184 10*3/uL (ref 150–400)
RBC: 5.77 MIL/uL — ABNORMAL HIGH (ref 3.87–5.11)
RDW: 16.4 % — ABNORMAL HIGH (ref 11.5–15.5)
WBC: 4.9 10*3/uL (ref 4.0–10.5)
nRBC: 0 % (ref 0.0–0.2)

## 2023-01-28 LAB — TROPONIN I (HIGH SENSITIVITY)
Troponin I (High Sensitivity): 10 ng/L (ref <18)
Troponin I (High Sensitivity): 11 ng/L (ref <18)
Troponin I (High Sensitivity): 8 ng/L (ref <18)

## 2023-01-28 LAB — MAGNESIUM: Magnesium: 1.9 mg/dL (ref 1.7–2.4)

## 2023-01-28 MED ORDER — POTASSIUM CHLORIDE CRYS ER 20 MEQ PO TBCR
40.0000 meq | EXTENDED_RELEASE_TABLET | Freq: Once | ORAL | Status: AC
  Administered 2023-01-28: 40 meq via ORAL
  Filled 2023-01-28: qty 2

## 2023-01-28 MED ORDER — SULFAMETHOXAZOLE-TRIMETHOPRIM 800-160 MG PO TABS: 1.0000 | ORAL_TABLET | Freq: Two times a day (BID) | ORAL | 0 refills | Status: AC

## 2023-01-28 MED ORDER — SULFAMETHOXAZOLE-TRIMETHOPRIM 800-160 MG PO TABS
1.0000 | ORAL_TABLET | Freq: Once | ORAL | Status: AC
  Administered 2023-01-28: 1 via ORAL
  Filled 2023-01-28: qty 1

## 2023-01-28 MED ORDER — IOHEXOL 300 MG/ML  SOLN
100.0000 mL | Freq: Once | INTRAMUSCULAR | Status: AC | PRN
  Administered 2023-01-28: 100 mL via INTRAVENOUS

## 2023-01-28 NOTE — Discharge Instructions (Addendum)
Stay well-hydrated by drinking plenty of fluids. Take antibiotics for the full course as prescribed.  Call Dr. Darrold Junker of cardiology to schedule an appointment to recheck on your heart conduction problems and to ask about a Holter monitor.  Thank you for choosing [place] for your health care today!  Please see your primary doctor this week for a follow up appointment.   Sometimes, in the early stages of certain disease courses it is difficult to detect in the emergency department evaluation -- so, it is important that you continue to monitor your symptoms and call your doctor right away or return to the emergency department if you develop any new or worsening symptoms.  Please go to the following website to schedule new (and existing) patient appointments:   [email or web address]  If you do not have a primary doctor try calling the following clinics to establish care:  If you have insurance:  [facility] [phone] [address] [place] [place] [address]   [facility]  [phone] [address] [place] [place] [address]   If you do not have insurance:  [facility]  [phone] [address], [place] [address]   The following is another list of primary care offices in the area who are accepting new patients at this time.  Please reach out to one of them directly and let them know you would like to schedule an appointment to follow up on an Emergency Department visit, and/or to establish a new primary care provider (PCP).  There are likely other primary care clinics in the are who are accepting new patients, but this is an excellent place to start:  [facility] Lead physician: Dr Shirlee Latch [address] [place], [place] [address] [phone]  [facility] Lead Physician: Dr Alba Cory [address], [place], [place] [address] [phone]  [facility]  Lead Physician: Dr Olevia Perches [address] [place], [place] [address] [phone]  [facility] Lead Physician: Dr Sofie Hartigan [address] [place], [place], [place] [address] [phone]  [facility] Lead Physician: Dr Bari Edward [address] [place], [place] [address] [phone]   It was my pleasure to care for you today.   Daneil Dan Modesto Charon, MD

## 2023-01-28 NOTE — ED Triage Notes (Signed)
First Nurse Note:  Pt via EMS from church, pt felt faint, EMS report initial pressure of 70/54. EMS reports RBBB on EKG. Denies pain. Denies head injury. EMS report 20G in the RAC, EMS gave bolus. Pt is A&OX4 and NAD

## 2023-01-28 NOTE — ED Triage Notes (Signed)
Pt to ED from church, [facility]. Pt had near syncopal episode and began feeling dizzy. Pt still slightly dizzy. Alert, oriented. Skin dry, resps unlabored.No HA or vision changes.

## 2023-01-28 NOTE — ED Provider Notes (Addendum)
[facility] Provider Note    Event Date/Time   First MD Initiated Contact with Patient 01/28/23 1515     (approximate)   History   Dizziness and Near Syncope   HPI  Vanessa Hogan is a 82 y.o. female   Past medical history of anxiety, depression, diabetes, GERD, hypertension presents emergency department with near syncope while at church.  She was in her regular state of health when she attended a new church this morning which required her to go 1 hour earlier than she normally goes to church for initiation and she ate a lighter breakfast than normal but took her diabetes medications.  While seated during discharge event she began to feel lightheaded but did not lose consciousness.  During this time she had no chest pain palpitations or nor shortness of breath and did not lose consciousness or sustain any trauma.  Symptoms resolved spontaneously.  EMS states that initial pressure was 70/50 but gave 500 cc bolus and now is normotensive.  Of note, she describes that over the last half a year she has an intermittent severe bilateral flank pain with no obvious inciting events that occur spontaneously and resolve spontaneously.  She has not had these episodes previously worked up.  They have not gotten more frequent nor more severe.  They are nonradiating.  She denies nausea vomiting or diarrhea.  She denies urinary symptoms.  She denies GI bleeding.  No longer feels lightheaded and feels at baseline currently.   External Medical Documents Reviewed: Emergency department visit dated 10/20/2022 for lightheadedness thought to be related to her recent COVID diagnosis at that time      Physical Exam   Triage Vital Signs: ED Triage Vitals  Enc Vitals Group     BP 01/28/23 1311 (!) 131/97     Pulse Rate 01/28/23 1311 73     Resp 01/28/23 1311 18     Temp 01/28/23 1311 97.6 F (36.4 C)     Temp Source 01/28/23 1311 Oral     SpO2 01/28/23 1311 100 %      Weight 01/28/23 1311 201 lb (91.2 kg)     Height 01/28/23 1311 5\' 6"  (1.676 m)     Head Circumference --      Peak Flow --      Pain Score 01/28/23 1314 0     Pain Loc --      Pain Edu? --      Excl. in GC? --     Most recent vital signs: Vitals:   01/28/23 1544 01/28/23 1726  BP: (!) 141/64 (!) 151/82  Pulse: 73 83  Resp: 16 17  Temp:    SpO2: 97% 96%    General: Awake, no distress.  CV:  Good peripheral perfusion.  Resp:  Normal effort.  Abd:  No distention.  Other:  Awake alert comfortable with normal hemodynamics and afebrile no respiratory distress lungs are clear abdomen soft and nontender she does appear slightly dehydrated with dry mucous membranes and poor skin turgor she is normotensive now after reportedly hypotensive prior to 500 cc given by EMS.  She has no CVA tenderness or midline tenderness to the back.  She has no focal neurologic deficits motor or sensory intact no dysarthria or facial asymmetry.   ED Results / Procedures / Treatments   Labs (all labs ordered are listed, but only abnormal results are displayed) Labs Reviewed  CBC - Abnormal; Notable for the following components:  Result Value   RBC 5.77 (*)    MCV 77.8 (*)    MCH 24.3 (*)    RDW 16.4 (*)    All other components within normal limits  BASIC METABOLIC PANEL - Abnormal; Notable for the following components:   Potassium 3.0 (*)    Glucose, Bld 110 (*)    All other components within normal limits  URINALYSIS, ROUTINE W REFLEX MICROSCOPIC - Abnormal; Notable for the following components:   Color, Urine YELLOW (*)    APPearance HAZY (*)    Leukocytes,Ua MODERATE (*)    All other components within normal limits  URINE CULTURE  MAGNESIUM  TROPONIN I (HIGH SENSITIVITY)  TROPONIN I (HIGH SENSITIVITY)  TROPONIN I (HIGH SENSITIVITY)     I ordered and reviewed the above labs they are notable for she has a normal H&H and normal white blood cell count her potassium is low at 3.0 and she  has leukocytes in the urine.  Initial troponin is negative.  EKG  ED ECG REPORT I, Pilar Jarvis, the attending physician, personally viewed and interpreted this ECG.   Date: 01/28/2023  EKG Time: 1315  Rate: 70  Rhythm: sinus -with first-degree AV block -- repeat is ordered.  Axis: nl  Intervals:rbbb  ST&T Change: no stemi      RADIOLOGY I independently reviewed and interpreted chest x-ray and see no obvious focality pneumothorax   PROCEDURES:  Critical Care performed: No  Procedures   MEDICATIONS ORDERED IN ED: Medications  sulfamethoxazole-trimethoprim (BACTRIM DS) 800-160 MG per tablet 1 tablet (1 tablet Oral Given 01/28/23 1554)  potassium chloride SA (KLOR-CON M) CR tablet 40 mEq (40 mEq Oral Given 01/28/23 1554)  iohexol (OMNIPAQUE) 300 MG/ML solution 100 mL (100 mLs Intravenous Contrast Given 01/28/23 1644)     IMPRESSION / MDM / ASSESSMENT AND PLAN / ED COURSE  I reviewed the triage vital signs and the nursing notes.                                Patient's presentation is most consistent with acute presentation with potential threat to life or bodily function.  Differential diagnosis includes, but is not limited to, ACS, dysrhythmia, electrolyte derangement, hypoglycemia, CVA, dehydration, UTI, renal stone, AAA   The patient is on the cardiac monitor to evaluate for evidence of arrhythmia and/or significant heart rate changes.  MDM: This is a patient with near syncope no focal neurologic deficits to suggest CVA.  No chest pain or shortness of breath to suggest ACS, PE, infection respiratory.  May be due to some mild dehydration as she looks mildly dehydrated and was hypotensive per EMS but responded well to 500 cc normal saline is asymptomatic now.  She missed her full breakfast this morning as well.  She has some inflammatory cells in her urine and some flank pain that been longstanding so I will check a CT scan to rule out kidney stone obstruction or other  intra-abdominal surgical pathologies.  Her EKG has similar right bundle branch block morphology to prior obtained in December 2023 with prolonged PR interval that is not new.  I doubt that these EKG findings are causative of her presyncopal episode today.  She has mild hypokalemia and was repleted with oral potassium in the emergency department.   -- Recheck of repeat EKG shows abnormal rhythm interpreted by the computer as complete heart block but I do not think this is the case,  however she does have PR prolongation that is inconsistent, a single dropped QRS, perhaps Mobitz 1.  Remains normotensive with a ventricular rate in the 70s.  Cardiology was consulted with his abnormal EKG.    Dr Juliann Paresallwood consulted and reviewed EKG does not believe it is a high-grade AV block that would explain her presyncopal symptoms from this morning.  May be a degree of Mobitz 1 versus first-degree AV block.  Recommended follow-up with Dr. Darrold JunkerParaschos outpatient. --  I considered admission/observation for presyncope but given most likely explained by mild dehydration, evidence of potential urinary tract infection and a plan for antibiotics, and EKG reviewed by cardiology as above unlikely to have contributed to her symptoms this morning with a plan for follow-up, I think outpatient follow-up and monitoring is most appropriate at this time.  Patient is feeling well.  She will return if any new or worsening symptoms.       FINAL CLINICAL IMPRESSION(S) / ED DIAGNOSES   Final diagnoses:  Flank pain  Near syncope  Urinary tract infection without hematuria, site unspecified  Hypokalemia  Cardiac arrhythmia, unspecified cardiac arrhythmia type     Rx / DC Orders   ED Discharge Orders          Ordered    sulfamethoxazole-trimethoprim (BACTRIM DS) 800-160 MG tablet  2 times daily        01/28/23 1537             Note:  This document was prepared using Dragon voice recognition software and may include  unintentional dictation errors.    Pilar JarvisWong, Donnika Kucher, MD 01/28/23 [id number]    Pilar JarvisWong, Bentlie Withem, MD 01/28/23 [phone]

## 2023-01-29 LAB — URINE CULTURE: Culture: NO GROWTH

## 2023-01-31 ENCOUNTER — Telehealth: Payer: Self-pay

## 2023-01-31 NOTE — Telephone Encounter (Signed)
     Patient  visit on 01/28/2023  at [facility] was for dizziness, near syncope.  Have you been able to follow up with your primary care physician? Patient has appointment with Cardiologist.  The patient was or was not able to obtain any needed medicine or equipment. Patient was able to obtain medication.  Are there diet recommendations that you are having difficulty following? No  Patient expresses understanding of discharge instructions and education provided has no other needs at this time. Yes   Vanessa [facility]  [facility] Population Health Community Resource Care Guide   [email or web address]  ?? [phone]   Website: [email or web address]  [email or web address]

## 2023-02-13 ENCOUNTER — Telehealth: Payer: Self-pay | Admitting: Family Medicine

## 2023-02-13 NOTE — Telephone Encounter (Signed)
Received pt portion and HAVE FAXED provider portion over for Olevia Perches office to have her review, fill out and sign and fax to [phone] ATTENTION Jakyla Reza L    Melanee Spry CPhT Rx Patient [facility] [phone]

## 2023-02-13 NOTE — Telephone Encounter (Signed)
Patient came into the office and stated that she need a medication refill for her : Global Ease inject pen needles 32gX46mm  She would [facility] [place] please call patient at [phone] when The RX has been sent.

## 2023-02-14 ENCOUNTER — Other Ambulatory Visit: Payer: Self-pay | Admitting: Family Medicine

## 2023-02-14 DIAGNOSIS — K219 Gastro-esophageal reflux disease without esophagitis: Secondary | ICD-10-CM

## 2023-02-14 DIAGNOSIS — E119 Type 2 diabetes mellitus without complications: Secondary | ICD-10-CM

## 2023-02-14 MED ORDER — GLOBAL EASE INJECT PEN NEEDLES 32G X 4 MM MISC
11 refills | Status: DC
Start: 2023-02-14 — End: 2023-02-16

## 2023-02-14 NOTE — Telephone Encounter (Signed)
Medication Refill - Medication: GLOBAL EASE INJECT PEN NEEDLES 32G X 4 MM MISC [[id number]]   ([facility] DON[facility]T HAVE THE NEEDLES [facility])   Has the patient contacted their pharmacy? Yes.    Preferred Pharmacy (with phone number or street name):  [id number] [place] [place] [facility] [address]  [address] [place] [address]  Phone: [phone] Fax: [phone]  Hours: Not open 24 hours     Has the patient been seen for an appointment in the last year OR does the patient have an upcoming appointment? Yes.    Agent: Please be advised that RX refills may take up to 3 business days. We ask that you follow-up with your pharmacy.

## 2023-02-14 NOTE — Telephone Encounter (Unsigned)
Copied from CRM [id number]. Topic: General - Other >> Feb 14, 2023 11:50 AM Everette C wrote: Reason for CRM: The patient has called to follow up on their previous requested refill of GLOBAL EASE INJECT PEN NEEDLES 32G X 4 MM MISC [[id number]]   Please contact the patient further when possible

## 2023-02-14 NOTE — Telephone Encounter (Signed)
Requested Prescriptions  Pending Prescriptions Disp Refills   GLOBAL EASE INJECT PEN NEEDLES 32G X 4 MM MISC 100 each 11    Sig: SMARTSIG:1 needle SUB-Q 3 Times Daily     Endocrinology: Diabetes - Testing Supplies Passed - 02/14/2023  3:55 PM      Passed - Valid encounter within last 12 months    Recent Outpatient Visits           1 month ago Type 2 diabetes mellitus with hyperglycemia, with long-term current use of insulin (HCC)   [facility] [place], Megan P, DO   2 months ago Controlled type 2 diabetes mellitus with microalbuminuria, with long-term current use of insulin (HCC)   [facility] [place], Megan P, DO   3 months ago Essential hypertension   [place] [facility] [place], [place], DO   3 months ago Dizziness   [facility] [place], Megan P, DO   4 months ago Type 2 diabetes mellitus with hyperglycemia, with long-term current use of insulin (HCC)   [place] [facility] [place], Oswaldo Conroy, PA-C       Future Appointments             In 1 month Johnson, Oralia Rud, DO  [facility], [facility]

## 2023-02-15 MED ORDER — GLOBAL EASE INJECT PEN NEEDLES 32G X 4 MM MISC: 11 refills | Status: DC

## 2023-02-15 NOTE — Telephone Encounter (Signed)
Pt stated [facility] not [facility] this particular brand of GLOBAL EASE INJECT PEN NEEDLES 32G X 4 MM MISC. Pt is asking if a different brand can be sent in just to make sure it's 32G X 4 MM as she really needs her pen needles.  On second day not taking her insulin.   [id number] [place] [place] - [address]  [address] [place] [address]  Phone: [phone] Fax: [phone]  Hours: Not open 24 hours   Please advise.

## 2023-02-16 ENCOUNTER — Other Ambulatory Visit: Payer: Self-pay

## 2023-02-16 MED ORDER — INSULIN PEN NEEDLE 32G X 4 MM MISC: 12 refills | Status: AC

## 2023-02-16 NOTE — Progress Notes (Signed)
   02/16/2023  Patient ID: Vanessa Hogan, female   DOB: August 08, 1941, 82 y.o.   MRN: [id number]  Subjective/Objective: Patient encounter after being contacted by medication assistance team questioning dose of Ozempic for PAP application.  DM -Patient is currently using 0.25mg  of Ozempic weekly with approximately 4 doses remaining in sample given by CFP.  States she is tolerating this dose well and would like to increase in hopes of lowering insulin dose. -Recently ran out of pen needles, so she has not had insulin in a few days; and states the prescription sent to her local retail pharmacy is >$70 -States home BG has been "good" but could not provide values.  States she has been limiting what she eats based on not being able to use insulin.  HTN -Recent ED visit for near syncope, and patient had hypotension.  Diagnosed with UTI and dehydration, but has also decreased valsartan/hctz dose by half- taking 160/12.5mg  daily -Is taking clonidine 0.2mg  daily again; does not believe headaches were caused by medication -Home BP has ranged 123-152/71-80 -Has upper arm, automatic monitor but states it is several  years old  Assessment/Plan:  DM -Patient portion of application missing dose was completed by medication assistance team for 0.5mg  weekly Ozempic -[facility] with [facility] to substitute for cheapest alternative on patient's insurance -Called pharmacy to verify receipt and cost; going through on insurance at no charge.  Called and made patient aware this will be ready for pick up later today.   HTN -Currently uncontrolled  -Patient sees Dr. Laural Benes again 04/02/23, and I suggested she bring her home BP monitor to validate accuracy against office reading -Can assist in obtaining new monitor if needed  Follow-Up:  Lenna Gilford, PharmD, DPLA

## 2023-02-19 ENCOUNTER — Emergency Department: Payer: Medicare PPO

## 2023-02-19 ENCOUNTER — Emergency Department
Admission: EM | Admit: 2023-02-19 | Discharge: 2023-02-19 | Disposition: A | Discharge status: Home / Self Care | Payer: Medicare PPO | Attending: Emergency Medicine | Admitting: Emergency Medicine

## 2023-02-19 ENCOUNTER — Other Ambulatory Visit: Payer: Self-pay

## 2023-02-19 DIAGNOSIS — R079 Chest pain, unspecified: Secondary | ICD-10-CM | POA: Insufficient documentation

## 2023-02-19 DIAGNOSIS — R0602 Shortness of breath: Secondary | ICD-10-CM | POA: Diagnosis not present

## 2023-02-19 DIAGNOSIS — R110 Nausea: Secondary | ICD-10-CM | POA: Diagnosis not present

## 2023-02-19 DIAGNOSIS — R209 Unspecified disturbances of skin sensation: Secondary | ICD-10-CM | POA: Diagnosis not present

## 2023-02-19 LAB — CBC
HCT: 46.6 % — ABNORMAL HIGH (ref 36.0–46.0)
Hemoglobin: 14.7 g/dL (ref 12.0–15.0)
MCH: 24.2 pg — ABNORMAL LOW (ref 26.0–34.0)
MCHC: 31.5 g/dL (ref 30.0–36.0)
MCV: 76.6 fL — ABNORMAL LOW (ref 80.0–100.0)
Platelets: 217 10*3/uL (ref 150–400)
RBC: 6.08 MIL/uL — ABNORMAL HIGH (ref 3.87–5.11)
RDW: 15.9 % — ABNORMAL HIGH (ref 11.5–15.5)
WBC: 3.7 10*3/uL — ABNORMAL LOW (ref 4.0–10.5)
nRBC: 0 % (ref 0.0–0.2)

## 2023-02-19 LAB — BASIC METABOLIC PANEL
Anion gap: 9 (ref 5–15)
BUN: 12 mg/dL (ref 8–23)
CO2: 28 mmol/L (ref 22–32)
Calcium: 9.4 mg/dL (ref 8.9–10.3)
Chloride: 102 mmol/L (ref 98–111)
Creatinine, Ser: 0.91 mg/dL (ref 0.44–1.00)
GFR, Estimated: >60 mL/min (ref >60)
Glucose, Bld: 135 mg/dL — ABNORMAL HIGH (ref 70–99)
Potassium: 4 mmol/L (ref 3.5–5.1)
Sodium: 139 mmol/L (ref 135–145)

## 2023-02-19 LAB — TSH: TSH: 1.386 u[IU]/mL (ref 0.350–4.500)

## 2023-02-19 LAB — TROPONIN I (HIGH SENSITIVITY)
Troponin I (High Sensitivity): 9 ng/L (ref <18)
Troponin I (High Sensitivity): 8 ng/L (ref <18)

## 2023-02-19 LAB — BRAIN NATRIURETIC PEPTIDE: B Natriuretic Peptide: 74.6 pg/mL (ref 0.0–100.0)

## 2023-02-19 MED ORDER — LIDOCAINE VISCOUS HCL 2 % MT SOLN
15.0000 mL | Freq: Once | OROMUCOSAL | Status: AC
  Administered 2023-02-19: 15 mL via ORAL
  Filled 2023-02-19: qty 15

## 2023-02-19 MED ORDER — LIDOCAINE VISCOUS HCL 2 % MT SOLN: 15.0000 mL | OROMUCOSAL | 1 refills | Status: DC | PRN

## 2023-02-19 MED ORDER — SUCRALFATE 1 GM/10ML PO SUSP: 1.0000 g | Freq: Four times a day (QID) | ORAL | 1 refills | Status: DC

## 2023-02-19 MED ORDER — IOHEXOL 350 MG/ML SOLN
75.0000 mL | Freq: Once | INTRAVENOUS | Status: AC | PRN
  Administered 2023-02-19: 75 mL via INTRAVENOUS

## 2023-02-19 MED ORDER — ALUM & MAG HYDROXIDE-SIMETH 200-200-20 MG/5ML PO SUSP
30.0000 mL | Freq: Once | ORAL | Status: AC
  Administered 2023-02-19: 30 mL via ORAL
  Filled 2023-02-19: qty 30

## 2023-02-19 NOTE — Telephone Encounter (Signed)
Submitted application for OZEMPIC to [employer] for patient assistance.   Phone: [phone]  Kamaile Zachow L. CPhT Rx Patient Advocate (O) [phone] (F) [phone]  

## 2023-02-19 NOTE — ED Notes (Signed)
As per EDP, pt is ready for discharge. Family is taking pt home

## 2023-02-19 NOTE — ED Triage Notes (Signed)
Pt arrives from home via EMS w/ c/o SOB and weakness x3 days, chest heaviness this am.  Hx HTN  AO4/GCS15 181/80  Not given any asa or nitro as pt has allergy to both

## 2023-02-19 NOTE — ED Notes (Signed)
EDP okay with pt being discharged with bp 165/78

## 2023-02-19 NOTE — Discharge Instructions (Signed)
Please seek medical attention for any high fevers, chest pain, shortness of breath, change in behavior, persistent vomiting, bloody stool or any other new or concerning symptoms.  

## 2023-02-19 NOTE — ED Provider Notes (Signed)
[facility] Provider Note    Event Date/Time   First MD Initiated Contact with Patient 02/19/23 [phone]     (approximate)   History   Shortness of Breath, Weakness, and Chest Pain   HPI  Vanessa Hogan is a 82 y.o. female  who presents to the emergency department today because of concern for chest pressure. Started this morning. Located in the center part of her chest. Initially was severe but has come down in intensity. Was accompanied by some nausea. Over the past three days the patient has been having issues with shortness of breath and fatigue. No cough. No fever. Says she has seen cardiology for similar issues in the past without clear etiology found.      Physical Exam   Triage Vital Signs: ED Triage Vitals  Enc Vitals Group     BP 02/19/23 0920 (!) 185/72     Pulse Rate 02/19/23 0920 74     Resp 02/19/23 0920 17     Temp 02/19/23 0920 98.7 F (37.1 C)     Temp Source 02/19/23 0920 Oral     SpO2 02/19/23 0920 98 %     Weight 02/19/23 0921 211 lb (95.7 kg)     Height 02/19/23 0921 5\' 6"  (1.676 m)     Head Circumference --      Peak Flow --      Pain Score 02/19/23 0919 4     Pain Loc --      Pain Edu? --      Excl. in GC? --     Most recent vital signs: Vitals:   02/19/23 0920  BP: (!) 185/72  Pulse: 74  Resp: 17  Temp: 98.7 F (37.1 C)  SpO2: 98%   General: Awake, alert, oriented. CV:  Good peripheral perfusion. Regular rate and rhythm. Resp:  Normal effort. Lungs clear. Abd:  No distention. Non tender. Other:  Trace bilateral pitting edema.   ED Results / Procedures / Treatments   Labs (all labs ordered are listed, but only abnormal results are displayed) Labs Reviewed  BASIC METABOLIC PANEL - Abnormal; Notable for the following components:      Result Value   Glucose, Bld 135 (*)    All other components within normal limits  CBC - Abnormal; Notable for the following components:   WBC 3.7 (*)    RBC 6.08 (*)     HCT 46.6 (*)    MCV 76.6 (*)    MCH 24.2 (*)    RDW 15.9 (*)    All other components within normal limits  TSH  BRAIN NATRIURETIC PEPTIDE  CBG MONITORING, ED  TROPONIN I (HIGH SENSITIVITY)  TROPONIN I (HIGH SENSITIVITY)     EKG  I, Phineas Semen, attending physician, personally viewed and interpreted this EKG  EKG Time: 0921 Rate: 74 Rhythm: sinus rhythm Axis: normal Intervals: qtc 482 QRS: RBBB ST changes: no st elevation Impression: abnormal ekg  RADIOLOGY I independently interpreted and visualized the CXR. My interpretation: No pneumonia. Radiology interpretation:  IMPRESSION:  No active disease.   I independently interpreted and visualized the ct angio pe. My interpretation: No large PE Radiology interpretation:  IMPRESSION:  There is no evidence of pulmonary embolism. There is no focal  pulmonary consolidation.    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    Small hiatal hernia.  Possible right renal cyst.      PROCEDURES:  Critical  Care performed: No    MEDICATIONS ORDERED IN ED: Medications - No data to display   IMPRESSION / MDM / ASSESSMENT AND PLAN / ED COURSE  I reviewed the triage vital signs and the nursing notes.                              Differential diagnosis includes, but is not limited to, ACS, pneumonia, pneumothorax, costochondritis, esophagitis, PE  Patient's presentation is most consistent with acute presentation with potential threat to life or bodily function.   The patient is on the cardiac monitor to evaluate for evidence of arrhythmia and/or significant heart rate changes.  Patient presented to the emergency department today with concerns for chest pressure.  EKG without ST elevation.  Chest x-ray without pneumonia or pneumothorax.  Troponin was negative x 2.  No significant leukocytosis.  Did obtain a CT angio to evaluate for PE which was negative.  At this time I  doubt aortic dissection.  Patient was given GI cocktail and did feel some improvement.  At this time I do wonder if patient is suffering from esophagitis.  Discussed this with the patient.  Given improvement think it is reasonable for patient be discharged.  She should continue to follow-up with her cardiologist.  Will also give patient GI follow-up information.      FINAL CLINICAL IMPRESSION(S) / ED DIAGNOSES   Final diagnoses:  Nonspecific chest pain     Note:  This document was prepared using Dragon voice recognition software and may include unintentional dictation errors.    Phineas Semen, MD 02/19/23 [phone]

## 2023-02-22 NOTE — Telephone Encounter (Signed)
Received notification from [employer] regarding approval for [employer]. Patient assistance approved from 02/21/2023 to 10/23/2023.  Phone: [phone]  [facility] [phone]) [phone]

## 2023-02-26 ENCOUNTER — Ambulatory Visit: Payer: Medicare PPO | Admitting: Physician Assistant

## 2023-02-26 ENCOUNTER — Encounter: Payer: Self-pay | Admitting: Physician Assistant

## 2023-02-26 ENCOUNTER — Encounter: Payer: Self-pay | Admitting: *Deleted

## 2023-02-26 VITALS — BP 138/82 | HR 64 | Temp 98.7°F | Ht 65.0 in | Wt 215.5 lb

## 2023-02-26 DIAGNOSIS — R5383 Other fatigue: Secondary | ICD-10-CM

## 2023-02-26 DIAGNOSIS — Z86010 Personal history of colonic polyps: Secondary | ICD-10-CM

## 2023-02-26 DIAGNOSIS — R1013 Epigastric pain: Secondary | ICD-10-CM | POA: Diagnosis not present

## 2023-02-26 MED ORDER — PEG 3350-KCL-NABCB-NACL-NASULF 236 G PO SOLR: 4000.0000 mL | Freq: Once | ORAL | 0 refills | Status: AC

## 2023-02-26 NOTE — Progress Notes (Signed)
Celso Amy, PA-C [address]  [address]  [place], [place] [address]  Main: [phone]  Fax: [phone]   Primary Care Physician: Dorcas Carrow, DO  Primary Gastroenterologist:  Dr. Lannette Donath   Chief Complaint  Patient presents with   Follow-up   Fatigue    HPI: Vanessa Hogan is a 82 y.o. female who returns for follow-up of chronic abdominal pain.  Previously saw Dr. Allegra Lai 07/2022 for lower abdominal pain.  CT Abd / Pelvis with Contrast showed no acute abnormality.  Incidental diverticulosis, small hiatal hernia, small uterine fibroids, and aortic atherosclerosis.  Dr. Allegra Lai recommended scheduling EGD and colonoscopy if her abdominal pain persisted.  She was seen at the ED 01/28/2023.  Had another abdominal pelvic CT with contrast which was unrevealing.  She reports having a colonoscopy in [place] 5 or 6 years ago with polyps removed.  She was seen at the ED 02/19/2023 for chest pain.  Chest CT was negative for pulmonary embolism.  Previous cardiac evaluation negative.  Labs 02/19/2023 showed negative troponins, normal TSH.  WBC 3.7, hemoglobin 14.7, MCV 76, platelets 217, BUN 12, creatinine 0.91, glucose 135.  Stool test 11/2022 was negative for H. pylori.  Her primary concern today is extreme fatigue.  She has follow-up appointment with her PCP tomorrow.  She was recently started on sucralfate 1 g twice daily which has helped her upper epigastric pain.  Takes pantoprazole 40 mg daily with good control of acid reflux.  Has mild occasional nausea but no vomiting.  Denies any recent lower abdominal pain, diarrhea, constipation, or rectal bleeding.    Medical history significant for depression, anxiety, diabetes, GERD, hypertension.    Current Outpatient Medications  Medication Sig Dispense Refill   ACCU-CHEK GUIDE test strip 1 each by Other route 3 (three) times daily. 100 each 12   Accu-Chek Softclix Lancets lancets SMARTSIG:Topical     allopurinol  (ZYLOPRIM) 100 MG tablet TAKE 1 TABLET(100 MG) BY MOUTH DAILY 90 tablet 0   celecoxib (CELEBREX) 100 MG capsule Take 1 capsule (100 mg total) by mouth daily. 90 capsule 1   cloNIDine (CATAPRES) 0.2 MG tablet Take 1 tablet (0.2 mg total) by mouth daily. 90 tablet 1   fluticasone (FLONASE) 50 MCG/ACT nasal spray Place into both nostrils.     Insulin Lispro Prot & Lispro (HUMALOG MIX 75/25 KWIKPEN) (75-25) 100 UNIT/ML Kwikpen INJECT 18 UNITS SUBCUTANEOUSLY EVERY MORNING AND 20 UNITS EVERY EVENING (UP TO 50 UNITS PER DAY MAX) 15 mL 0   Insulin Pen Needle 32G X 4 MM MISC Use to inject insulin twice daily 200 each 12   lidocaine (XYLOCAINE) 2 % solution Use as directed 15 mLs in the mouth or throat as needed (chest pain). 100 mL 1   loratadine (CLARITIN) 10 MG tablet Take 10 mg by mouth daily as needed.     LORazepam (ATIVAN) 0.5 MG tablet Take 1 tablet (0.5 mg total) by mouth as needed for anxiety. 20 tablet 0   metFORMIN (GLUCOPHAGE-XR) 500 MG 24 hr tablet Take 500 mg by mouth daily as needed.     pantoprazole (PROTONIX) 40 MG tablet Take 40 mg by mouth daily.     polyethylene glycol (GOLYTELY) 236 g solution Take 4,000 mLs by mouth once for 1 dose. 4000 mL 0   rosuvastatin (CRESTOR) 10 MG tablet Take 1 tablet (10 mg total) by mouth daily. 90 tablet 3   Semaglutide,0.25 or 0.5MG /DOS, (OZEMPIC, 0.25 OR 0.5 MG/DOSE,) 2 MG/3ML SOPN  0.25mg  weekly for 1 month, then increase to 0.5mg  weekly 3 mL 6   sucralfate (CARAFATE) 1 g tablet Take 1 tablet (1 g total) by mouth 4 (four) times daily -  with meals and at bedtime. 120 tablet 1   sucralfate (CARAFATE) 1 GM/10ML suspension Take 10 mLs (1 g total) by mouth 4 (four) times daily. 420 mL 1   valsartan-hydrochlorothiazide (DIOVAN-HCT) 160-12.5 MG tablet Take 1 tablet by mouth daily.     No current facility-administered medications for this visit.    Allergies as of 02/26/2023 - Review Complete 02/26/2023  Allergen Reaction Noted   Asa [aspirin]  07/04/2021    Metformin and related Other (See Comments) 11/01/2022   Morphine and related  07/04/2021   Nitroglycerin Other (See Comments) 03/05/2020   Penicillins Hives 07/04/2021   Sertraline Other (See Comments) 05/19/2020      ROS:  General: Negative for anorexia, weight loss, fever, chills, fatigue, weakness. ENT: Negative for hoarseness, difficulty swallowing , nasal congestion. CV: Negative for chest pain, angina, palpitations, dyspnea on exertion, peripheral edema.  Respiratory: Negative for dyspnea at rest, dyspnea on exertion, cough, sputum, wheezing.  GI: See history of present illness. GU:  Negative for dysuria, hematuria, urinary incontinence, urinary frequency, nocturnal urination.  Endo: Negative for unusual weight change.    Physical Examination:   BP 138/82   Pulse 64   Temp 98.7 F (37.1 C) (Oral)   Ht 5\' 5"  (1.651 m)   Wt 215 lb 8 oz (97.8 kg)   BMI 35.86 kg/m   General: Well-nourished, well-developed in no acute distress.  Eyes: No icterus. Conjunctivae pink. Mouth: Oropharyngeal mucosa moist and pink , no lesions erythema or exudate. Lungs: Clear to auscultation bilaterally. Non-labored. Heart: Regular rate and rhythm, no murmurs rubs or gallops.  Abdomen: Bowel sounds are normal, nontender, soft, obese, no hepatosplenomegaly or masses, no abdominal bruits or hernia , no rebound or guarding.   Extremities: No lower extremity edema. No clubbing or deformities. Neuro: Alert and oriented x 3.  Grossly intact. Skin: Warm and dry, no jaundice.   Psych: Alert and cooperative, normal mood and affect.   Imaging Studies: CT Angio Chest PE W and/or Wo Contrast  Result Date: 02/19/2023 CLINICAL DATA:  Chest pain, shortness of breath EXAM: CT ANGIOGRAPHY CHEST WITH CONTRAST TECHNIQUE: Multidetector CT imaging of the chest was performed using the standard protocol during bolus administration of intravenous contrast. Multiplanar CT image reconstructions and MIPs were  obtained to evaluate the vascular anatomy. RADIATION DOSE REDUCTION: This exam was performed according to the departmental dose-optimization program which includes automated exposure control, adjustment of the mA and/or kV according to patient size and/or use of iterative reconstruction technique. CONTRAST:  75mL OMNIPAQUE IOHEXOL 350 MG/ML SOLN COMPARISON:  Previous CT done on 04/29/2022, chest radiograph done earlier today FINDINGS: Cardiovascular: There are no intraluminal filling defects seen central pulmonary artery branches. Evaluation of small peripheral subsegmental branches is less than optimal. There is ectasia of the ascending thoracic aorta measuring 3.2 cm. Scattered calcifications are seen in thoracic aorta. Mediastinum/Nodes: No significant lymphadenopathy is seen. Calcified lymph nodes are seen in mediastinum with no significant change. Lungs/Pleura: There is no focal pulmonary consolidation. There is no pleural effusion or pneumothorax. Upper Abdomen: There is possible 1.2 cm cyst in the upper pole of right kidney. Small hiatal hernia is seen. Musculoskeletal: No acute findings are seen. Review of the MIP images confirms the above findings. IMPRESSION: There is no evidence of pulmonary embolism. There is  no focal pulmonary consolidation. Aortic arteriosclerosis. There is ectasia of the ascending thoracic aorta measuring 3.2 cm. Small hiatal hernia.  Possible right renal cyst. Electronically Signed   By: Ernie Avena M.D.   On: 02/19/2023 12:17   DG Chest Portable 1 View  Result Date: 02/19/2023 CLINICAL DATA:  Shortness of breath and weakness.  Chest heaviness. EXAM: PORTABLE CHEST 1 VIEW COMPARISON:  Chest radiographs 01/28/2023 FINDINGS: The cardiomediastinal silhouette is unchanged with the heart being upper limits of normal in size. The lungs are mildly hypoinflated with mild elevation of the right hemidiaphragm. No airspace consolidation, edema, sizable pleural effusion, or  pneumothorax is identified. No acute osseous abnormality is seen. IMPRESSION: No active disease. Electronically Signed   By: Sebastian Ache M.D.   On: 02/19/2023 09:44   CT Abdomen Pelvis W Contrast  Result Date: 01/28/2023 CLINICAL DATA:  Acute abdominal and flank pain.  Hypotension. EXAM: CT ABDOMEN AND PELVIS WITH CONTRAST TECHNIQUE: Multidetector CT imaging of the abdomen and pelvis was performed using the standard protocol following bolus administration of intravenous contrast. RADIATION DOSE REDUCTION: This exam was performed according to the departmental dose-optimization program which includes automated exposure control, adjustment of the mA and/or kV according to patient size and/or use of iterative reconstruction technique. CONTRAST:  OMNIPAQUE IOHEXOL 300 MG/ML  SOLN COMPARISON:  08/22/2022 FINDINGS: Lower Chest: No acute findings. Hepatobiliary:  No hepatic masses identified. Pancreas:  No mass or inflammatory changes. Spleen: Within normal limits in size and appearance. Adrenals/Urinary Tract: No suspicious masses identified. No evidence of ureteral calculi or hydronephrosis. Stomach/Bowel: Tiny hiatal hernia again noted. No evidence of obstruction, inflammatory process or abnormal fluid collections. Diverticulosis is seen mainly involving the sigmoid colon, however there is no evidence of diverticulitis. Vascular/Lymphatic: No pathologically enlarged lymph nodes. No acute vascular findings. Aortic atherosclerotic calcification incidentally noted. Reproductive: Several small uterine fibroids are seen, 1 of which is calcified measuring approximately 2.5 cm. These show no significant change. Adnexal regions are unremarkable. No evidence of free fluid. Other:  None. Musculoskeletal:  No suspicious bone lesions identified. IMPRESSION: No acute findings within the abdomen or pelvis. Colonic diverticulosis, without radiographic evidence of diverticulitis. Stable small uterine fibroids. Tiny hiatal  hernia. Aortic Atherosclerosis (ICD10-I70.0). Electronically Signed   By: Danae Orleans M.D.   On: 01/28/2023 17:23   DG Chest 2 View  Result Date: 01/28/2023 CLINICAL DATA:  near syncoppe EXAM: CHEST - 2 VIEW COMPARISON:  October 20, 2022 FINDINGS: The cardiomediastinal silhouette is unchanged in contour. No pleural effusion. No pneumothorax. No acute pleuroparenchymal abnormality. Visualized abdomen is unremarkable. Multilevel degenerative changes of the thoracic spine. IMPRESSION: No acute cardiopulmonary abnormality. Electronically Signed   By: Meda Klinefelter M.D.   On: 01/28/2023 13:45    Assessment and Plan:   Vanessa Hogan is a 82 y.o. y/o female    Chronic Abdominal Pain -abdominal pelvic CT scans have shown no acute abnormality to explain abdominal pain.  Labs unrevealing.  No abdominal pain or tenderness on exam today. Scheduling EGD and colonoscopy to evaluate for sources of abdominal pain. Continue pantoprazole 40 mg once daily and sucralfate 1 g twice daily to help with GERD and epigastric pain.  2.    History of colon polyps  Scheduling a repeat colonoscopy  I have discussed risks & benefits of the procedure  which include, but are not limited to, bleeding, infection, perforation,respiratory compromise & drug reaction.  The patient agrees with this plan & written consent will be obtained.  3.    Fatigue  She has follow-up appointment tomorrow with her PCP for further evaluation.  Reassurance regarding recent normal TSH, CBC, and BMP results.  Celso Amy, PA-C  Follow up in 3 months.

## 2023-02-26 NOTE — Patient Instructions (Signed)
Please schedule 3 months follow with Dr Allegra Lai or Shelly Rubenstein, PA-C

## 2023-02-27 ENCOUNTER — Ambulatory Visit (INDEPENDENT_AMBULATORY_CARE_PROVIDER_SITE_OTHER): Payer: Medicare PPO | Admitting: Family Medicine

## 2023-02-27 ENCOUNTER — Encounter: Payer: Self-pay | Admitting: Family Medicine

## 2023-02-27 VITALS — BP 135/79 | HR 69 | Temp 98.2°F | Ht 65.0 in | Wt 216.9 lb

## 2023-02-27 DIAGNOSIS — K219 Gastro-esophageal reflux disease without esophagitis: Secondary | ICD-10-CM | POA: Diagnosis not present

## 2023-02-27 DIAGNOSIS — I700 Atherosclerosis of aorta: Secondary | ICD-10-CM

## 2023-02-27 MED ORDER — TRIAMCINOLONE ACETONIDE 0.5 % EX OINT: 1.0000 | TOPICAL_OINTMENT | Freq: Two times a day (BID) | CUTANEOUS | 2 refills | Status: DC

## 2023-02-27 NOTE — Progress Notes (Signed)
BP 135/79   Pulse 69   Temp 98.2 F (36.8 C) (Oral)   Ht 5\' 5"  (1.651 m)   Wt 216 lb 14.4 oz (98.4 kg)   SpO2 96%   BMI 36.09 kg/m    Subjective:    Patient ID: Vanessa Hogan, female    DOB: 11/18/1940, 82 y.o.   MRN: [id number]  HPI: Vanessa Hogan is a 82 y.o. female  Chief Complaint  Patient presents with   Chest Pain    Patient says she is feeling better. Patient says she was told it was inflammation of her Esophagus.    ER FOLLOW UP Time since discharge: 8 days Hospital/facility: [facility] Diagnosis: Chest pressure Procedures/tests:  CLINICAL DATA:  Shortness of breath and weakness.  Chest heaviness.   EXAM: PORTABLE CHEST 1 VIEW   COMPARISON:  Chest radiographs 01/28/2023   FINDINGS: The cardiomediastinal silhouette is unchanged with the heart being upper limits of normal in size. The lungs are mildly hypoinflated with mild elevation of the right hemidiaphragm. No airspace consolidation, edema, sizable pleural effusion, or pneumothorax is identified. No acute osseous abnormality is seen.   IMPRESSION: No active disease.  CLINICAL DATA:  Chest pain, shortness of breath   EXAM: CT ANGIOGRAPHY CHEST WITH CONTRAST   TECHNIQUE: Multidetector CT imaging of the chest was performed using the standard protocol during bolus administration of intravenous contrast. Multiplanar CT image reconstructions and MIPs were obtained to evaluate the vascular anatomy.   RADIATION DOSE REDUCTION: This exam was performed according to the departmental dose-optimization program which includes automated exposure control, adjustment of the mA and/or kV according to patient size and/or use of iterative reconstruction technique.   CONTRAST:  75mL OMNIPAQUE IOHEXOL 350 MG/ML SOLN   COMPARISON:  Previous CT done on 04/29/2022, chest radiograph done earlier today   FINDINGS: Cardiovascular: There are no intraluminal filling defects seen central pulmonary artery  branches. Evaluation of small peripheral subsegmental branches is less than optimal. There is ectasia of the ascending thoracic aorta measuring 3.2 cm. Scattered calcifications are seen in thoracic aorta.   Mediastinum/Nodes: No significant lymphadenopathy is seen. Calcified lymph nodes are seen in mediastinum with no significant change.   Lungs/Pleura: There is no focal pulmonary consolidation. There is no pleural effusion or pneumothorax.   Upper Abdomen: There is possible 1.2 cm cyst in the upper pole of right kidney. Small hiatal hernia is seen.   Musculoskeletal: No acute findings are seen.   Review of the MIP images confirms the above findings.   IMPRESSION: There is no evidence of pulmonary embolism. There is no focal pulmonary consolidation.   Aortic arteriosclerosis. There is ectasia of the ascending thoracic aorta measuring 3.2 cm.   Small hiatal hernia.  Possible right renal cyst. Consultants: None New medications: GI cocktail Discharge instructions:  Follow up with GI Status: better  Since getting back from the ER, Vanessa Hogan is feeling well. No concerns.    Relevant past medical, surgical, family and social history reviewed and updated as indicated. Interim medical history since our last visit reviewed. Allergies and medications reviewed and updated.  Review of Systems  Constitutional: Negative.   Respiratory: Negative.    Cardiovascular: Negative.   Gastrointestinal: Negative.   Musculoskeletal: Negative.   Neurological: Negative.   Psychiatric/Behavioral: Negative.      Per HPI unless specifically indicated above     Objective:    BP 135/79   Pulse 69   Temp 98.2 F (36.8 C) (Oral)   Ht 5'  5" (1.651 m)   Wt 216 lb 14.4 oz (98.4 kg)   SpO2 96%   BMI 36.09 kg/m   Wt Readings from Last 3 Encounters:  02/27/23 216 lb 14.4 oz (98.4 kg)  02/26/23 215 lb 8 oz (97.8 kg)  02/19/23 211 lb (95.7 kg)    Physical Exam Vitals and nursing note reviewed.   Constitutional:      General: She is not in acute distress.    Appearance: Normal appearance. She is well-developed. She is not ill-appearing, toxic-appearing or diaphoretic.  HENT:     Head: Normocephalic and atraumatic.     Right Ear: External ear normal.     Left Ear: External ear normal.     Nose: Nose normal.     Mouth/Throat:     Mouth: Mucous membranes are moist.     Pharynx: Oropharynx is clear.  Eyes:     General: No scleral icterus.       Right eye: No discharge.        Left eye: No discharge.     Extraocular Movements: Extraocular movements intact.     Conjunctiva/sclera: Conjunctivae normal.     Pupils: Pupils are equal, round, and reactive to light.  Cardiovascular:     Rate and Rhythm: Normal rate and regular rhythm.     Pulses: Normal pulses.     Heart sounds: Normal heart sounds. No murmur heard.    No friction rub. No gallop.  Pulmonary:     Effort: Pulmonary effort is normal. No respiratory distress.     Breath sounds: Normal breath sounds. No stridor. No wheezing, rhonchi or rales.  Chest:     Chest wall: No tenderness.  Musculoskeletal:        General: Normal range of motion.     Cervical back: Normal range of motion and neck supple.  Skin:    General: Skin is warm and dry.     Capillary Refill: Capillary refill takes less than 2 seconds.     Coloration: Skin is not jaundiced or pale.     Findings: No bruising, erythema, lesion or rash.  Neurological:     General: No focal deficit present.     Mental Status: She is alert and oriented to person, place, and time. Mental status is at baseline.  Psychiatric:        Mood and Affect: Mood normal.        Behavior: Behavior normal.        Thought Content: Thought content normal.        Judgment: Judgment normal.     Results for orders placed or performed during the hospital encounter of 02/19/23  Basic metabolic panel  Result Value Ref Range   Sodium 139 135 - 145 mmol/L   Potassium 4.0 3.5 - 5.1  mmol/L   Chloride 102 98 - 111 mmol/L   CO2 28 22 - 32 mmol/L   Glucose, Bld 135 (H) 70 - 99 mg/dL   BUN 12 8 - 23 mg/dL   Creatinine, Ser 1.61 0.44 - 1.00 mg/dL   Calcium 9.4 8.9 - 09.6 mg/dL   GFR, Estimated >04 >54 mL/min   Anion gap 9 5 - 15  CBC  Result Value Ref Range   WBC 3.7 (L) 4.0 - 10.5 K/uL   RBC 6.08 (H) 3.87 - 5.11 MIL/uL   Hemoglobin 14.7 12.0 - 15.0 g/dL   HCT 09.8 (H) 11.9 - 14.7 %   MCV 76.6 (L) 80.0 - 100.0 fL  MCH 24.2 (L) 26.0 - 34.0 pg   MCHC 31.5 30.0 - 36.0 g/dL   RDW 16.1 (H) 09.6 - 04.5 %   Platelets 217 150 - 400 K/uL   nRBC 0.0 0.0 - 0.2 %  TSH  Result Value Ref Range   TSH 1.386 0.350 - 4.500 uIU/mL  Brain natriuretic peptide  Result Value Ref Range   B Natriuretic Peptide 74.6 0.0 - 100.0 pg/mL  Troponin I (High Sensitivity)  Result Value Ref Range   Troponin I (High Sensitivity) 9 <18 ng/L  Troponin I (High Sensitivity)  Result Value Ref Range   Troponin I (High Sensitivity) 8 <18 ng/L      Assessment & Plan:   Problem List Items Addressed This Visit       Cardiovascular and Mediastinum   Aortic atherosclerosis (HCC)    Found incidentally on imaging. Will keep her BP and cholesterol under good control. Continue to monitor.         Digestive   Gastroesophageal reflux disease - Primary    Significantly better with the GI cocktail. Feeling more like herself. Scheduled for EGD in June. Continue to follow with GI. Call with any concerns.         Follow up plan: Return for as scheduled.

## 2023-02-28 ENCOUNTER — Other Ambulatory Visit: Payer: Self-pay | Admitting: Family Medicine

## 2023-02-28 DIAGNOSIS — E119 Type 2 diabetes mellitus without complications: Secondary | ICD-10-CM

## 2023-02-28 NOTE — Telephone Encounter (Signed)
Requested Prescriptions  Pending Prescriptions Disp Refills   Insulin Lispro Prot & Lispro (HUMALOG MIX 75/25 KWIKPEN) (75-25) 100 UNIT/ML [employer] Med Name: HUMALOG MIX 75/25 KWIKPEN (75-25) 1] 15 mL 0    Sig: INJECT 18 UNITS SUBCUTANEOUSLY EVERY MORNING AND 20 UNITS EVERY EVENING     Endocrinology:  Diabetes - Insulins Passed - 02/28/2023  1:07 PM      Passed - HBA1C is between 0 and 7.9 and within 180 days    HB A1C (BAYER DCA - WAIVED)  Date Value Ref Range Status  12/28/2022 7.4 (H) 4.8 - 5.6 % Final    Comment:             Prediabetes: 5.7 - 6.4          Diabetes: >6.4          Glycemic control for adults with diabetes: <7.0          Passed - Valid encounter within last 6 months    Recent Outpatient Visits           Yesterday    [facility] [place], [place] P, DO   2 months ago Type 2 diabetes mellitus with hyperglycemia, with long-term current use of insulin (HCC)   [facility] [place], Megan P, DO   3 months ago Controlled type 2 diabetes mellitus with microalbuminuria, with long-term current use of insulin (HCC)   [place] [facility] [place], Megan P, DO   3 months ago Essential hypertension   [place] [facility] [place], [place], DO   4 months ago Dizziness   [facility] [place], [place], DO       Future Appointments             In 1 month Johnson, Oralia Rud, DO [place] [facility], [facility]

## 2023-03-01 ENCOUNTER — Telehealth: Payer: Self-pay | Admitting: Family Medicine

## 2023-03-01 DIAGNOSIS — E119 Type 2 diabetes mellitus without complications: Secondary | ICD-10-CM

## 2023-03-01 MED ORDER — INSULIN LISPRO PROT & LISPRO (75-25 MIX) 100 UNIT/ML KWIKPEN
PEN_INJECTOR | SUBCUTANEOUS | 0 refills | Status: DC
Start: 2023-03-01 — End: 2023-03-06

## 2023-03-01 NOTE — Telephone Encounter (Signed)
Copied from CRM [id number]. Topic: General - Other >> Mar 01, 2023 10:48 AM Macon Large wrote: Reason for CRM: Leah with [facility] requests that the Rx for Insulin Lispro Prot & Lispro (HUMALOG MIX 75/25 KWIKPEN) (75-25) 100 UNIT/ML Kwikpen be resubmitted for 2 boxes so patient can save and not have to pay 2 co-pay amounts.

## 2023-03-01 NOTE — Telephone Encounter (Signed)
Medication Refill - Medication: Insulin Lispro Prot & Lispro (HUMALOG MIX 75/25 KWIKPEN) (75-25) 100 UNIT/ML Kwikpen  Has the patient contacted their pharmacy? Yes.   Pharmacy needs provider to contact them so they can advise of how to write the script so that patient will get the med for $35 instead of $72.  Preferred Pharmacy (with phone number or street name): [facility] - [place], [place] - [address]  Phone: [phone] Fax: [phone]  Has the patient been seen for an appointment in the last year OR does the patient have an upcoming appointment? Yes.    Agent: Please be advised that RX refills may take up to 3 business days. We ask that you follow-up with your pharmacy.

## 2023-03-02 ENCOUNTER — Encounter: Payer: Self-pay | Admitting: Family Medicine

## 2023-03-02 DIAGNOSIS — I700 Atherosclerosis of aorta: Secondary | ICD-10-CM | POA: Insufficient documentation

## 2023-03-02 NOTE — Assessment & Plan Note (Signed)
Found incidentally on imaging. Will keep her BP and cholesterol under good control. Continue to monitor.

## 2023-03-02 NOTE — Assessment & Plan Note (Signed)
Significantly better with the GI cocktail. Feeling more like herself. Scheduled for EGD in June. Continue to follow with GI. Call with any concerns.

## 2023-03-05 ENCOUNTER — Other Ambulatory Visit: Payer: Medicare PPO

## 2023-03-06 ENCOUNTER — Other Ambulatory Visit: Payer: Self-pay | Admitting: Family Medicine

## 2023-03-06 ENCOUNTER — Telehealth: Payer: Self-pay

## 2023-03-06 DIAGNOSIS — E119 Type 2 diabetes mellitus without complications: Secondary | ICD-10-CM

## 2023-03-06 MED ORDER — INSULIN LISPRO PROT & LISPRO (75-25 MIX) 100 UNIT/ML KWIKPEN
PEN_INJECTOR | SUBCUTANEOUS | 0 refills | Status: DC
Start: 2023-03-06 — End: 2023-03-07

## 2023-03-06 NOTE — Telephone Encounter (Signed)
2 boxes of Ozempic received for the patient. Called and notified her that it was ready to be picked up.

## 2023-03-06 NOTE — Telephone Encounter (Signed)
Refilled 03/01/23 30 ml. Requested Prescriptions  Refused Prescriptions Disp Refills   Insulin Lispro Prot & Lispro (HUMALOG MIX 75/25 KWIKPEN) (75-25) 100 UNIT/ML [employer] Med Name: HUMALOG MIX 75/25 KWIKPEN (75-25) 1] 15 mL     Sig: INJECT 18 UNITS SUBCUTANEOUSLY EVERY MORNING AND 20 UNITS EVERY EVENING     Endocrinology:  Diabetes - Insulins Passed - 03/06/2023  2:26 PM      Passed - HBA1C is between 0 and 7.9 and within 180 days    HB A1C (BAYER DCA - WAIVED)  Date Value Ref Range Status  12/28/2022 7.4 (H) 4.8 - 5.6 % Final    Comment:             Prediabetes: 5.7 - 6.4          Diabetes: >6.4          Glycemic control for adults with diabetes: <7.0          Passed - Valid encounter within last 6 months    Recent Outpatient Visits           1 week ago Gastroesophageal reflux disease, unspecified whether esophagitis present   [place] [facility] [place], Megan P, DO   2 months ago Type 2 diabetes mellitus with hyperglycemia, with long-term current use of insulin (HCC)   [facility] [place], Megan P, DO   3 months ago Controlled type 2 diabetes mellitus with microalbuminuria, with long-term current use of insulin (HCC)   [facility] [place], Megan P, DO   4 months ago Essential hypertension   [place] [facility] [place], [place], DO   4 months ago Dizziness   [place] [facility] [place], [place], DO       Future Appointments             In 3 weeks Laural Benes, Oralia Rud, DO [place], [facility]

## 2023-03-06 NOTE — Telephone Encounter (Signed)
Medication picked up by the patient.  

## 2023-03-06 NOTE — Telephone Encounter (Signed)
Requested Prescriptions  Pending Prescriptions Disp Refills   Insulin Lispro Prot & Lispro (HUMALOG MIX 75/25 KWIKPEN) (75-25) 100 UNIT/ML Kwikpen 30 mL 0    Sig: INJECT 18 UNITS SUBCUTANEOUSLY EVERY MORNING AND 20 UNITS EVERY EVENING     Endocrinology:  Diabetes - Insulins Passed - 03/06/2023  5:03 PM      Passed - HBA1C is between 0 and 7.9 and within 180 days    HB A1C (BAYER DCA - WAIVED)  Date Value Ref Range Status  12/28/2022 7.4 (H) 4.8 - 5.6 % Final    Comment:             Prediabetes: 5.7 - 6.4          Diabetes: >6.4          Glycemic control for adults with diabetes: <7.0          Passed - Valid encounter within last 6 months    Recent Outpatient Visits           1 week ago Gastroesophageal reflux disease, unspecified whether esophagitis present   [place] [facility] [place], Megan P, DO   2 months ago Type 2 diabetes mellitus with hyperglycemia, with long-term current use of insulin (HCC)   [place] [facility] [place], Megan P, DO   3 months ago Controlled type 2 diabetes mellitus with microalbuminuria, with long-term current use of insulin (HCC)   [place] [facility] [place], Megan P, DO   4 months ago Essential hypertension   [place] [facility] [place], [place], DO   4 months ago Dizziness   [place] [facility] [place], [place], DO       Future Appointments             In 3 weeks Laural Benes, Oralia Rud, DO  [facility], [facility]

## 2023-03-06 NOTE — Telephone Encounter (Signed)
Medication Refill - Medication: Insulin Lispro Prot & Lispro   Refill denied says not appropriate, pat now requesting this go to [facility] in [place]  Has the patient contacted their pharmacy? yes (Agent: If no, request that the patient contact the pharmacy for the refill. If patient does not wish to contact the pharmacy document the reason why and proceed with request.) (Agent: If yes, when and what did the pharmacy advise?)contact pcp  Preferred Pharmacy (with phone number or street name):  [address], [place], [place] [address] Phone: [phone] Has the patient been seen for an appointment in the last year OR does the patient have an upcoming appointment? yes  Agent: Please be advised that RX refills may take up to 3 business days. We ask that you follow-up with your pharmacy.

## 2023-03-06 NOTE — Telephone Encounter (Signed)
[facility] called and spoke to [employer], Pensions consultant about the refill(s) humalog 75/25 [employer] requested. Advised it was sent on 03/01/23 to a different [facility], asked if it could be pulled over for refill at this location. She transferred me to the dispensing department who handles this. Long hold time, will go ahead and send this to the correct pharmacy.

## 2023-03-07 ENCOUNTER — Other Ambulatory Visit: Payer: Medicare PPO

## 2023-03-07 MED ORDER — INSULIN LISPRO PROT & LISPRO (75-25 MIX) 100 UNIT/ML KWIKPEN
PEN_INJECTOR | SUBCUTANEOUS | 1 refills | Status: DC
Start: 2023-03-07 — End: 2023-05-03

## 2023-03-07 NOTE — Progress Notes (Signed)
   03/07/2023  Patient ID: Vanessa Hogan, female   DOB: [birth year], 82 y.o.   MRN: [id number]  Subjective/Objective: Telephone follow-up to check on management of diabetes  DM -Patient picked up PAP Ozempic from Dr. Henriette Combs office 5/14- received 2 boxes of 0.25/0.5mg  pens -States she is tolerating well; however patient was recently seen in ED for chest pain thought to be associated with esophagitis -Educated patient Ozempic can cause adverse GI effects, so to monitor this -She is taking carafate if needed, and states condition has improved -This is not a new medication for her, and she previously tolerated well -Has an appointment with GI for scope 03/29/23 -Scheduled with Dr. Laural Benes 6/10 and  Dr. Mercy Moore [facility]) 6/28  HTN -Patient seen at [facility] 5/13 as follow-up from ED visit; cleared from cardiology stand point -BP at visit was 124/80  Assessment/Plan:  DM -Educated patient to remain at 0.25mg  dose versus increasing to 0.5mg  weekly after 4 weeks -Monitor progression of possible esophagitis to assess appropriateness of Ozempic treatment -Due to location, patient is transferring care to Dr. Mercy Moore in [place]; will make both Dr. Mercy Moore and Laural Benes aware of this information -If patient remains on Ozempic, a Novo prescription update form will need to be faxed to PAP program from Dr. Mercy Moore, as the program mails product to Dr. Isidore Moos versus patient home  HTN -Controlled -Continue current regimen and regular follow-up with PCP and cardiology  Follow-up: Estelle Grumbles is [facility] pharmacist for [facility]; so I will inform Dr. Mercy Moore to contact her if there are future pharmacy needs.  Lenna Gilford, PharmD, DPLA

## 2023-03-07 NOTE — Addendum Note (Signed)
Addended by: Ross Ludwig on: 03/07/2023 11:26 AM   Modules accepted: Orders

## 2023-03-08 ENCOUNTER — Telehealth: Payer: Self-pay | Admitting: Family Medicine

## 2023-03-08 NOTE — Telephone Encounter (Signed)
Pt is calling in because she says her insurance needs a PA for Insulin Lispro Prot & Lispro (HUMALOG MIX 75/25 KWIKPEN) (75-25) 100 UNIT/ML Stephanie Coup [[id number]] . Pt says the medication was sent to [facility] and they need the authorization before they can fill it. Pt says she has been trying to get this prescription filled for a week.

## 2023-03-09 ENCOUNTER — Other Ambulatory Visit: Payer: Medicare PPO

## 2023-03-09 ENCOUNTER — Other Ambulatory Visit: Payer: Self-pay | Admitting: Family Medicine

## 2023-03-09 DIAGNOSIS — E119 Type 2 diabetes mellitus without complications: Secondary | ICD-10-CM

## 2023-03-09 NOTE — Telephone Encounter (Signed)
Prior authorization initiated via CoverMyMeds for prescription of the Humalog Mix 75/25 Kwikpen. Awaiting determination from insurance. Prior authorization was cancelled as message says "Available without authorization." Patient local pharmacy was notified and says the prescription went through for the patient at a co-pay of $75 and informed me they will notified the patient when ready for pick up.  KEY: [id number]

## 2023-03-09 NOTE — Telephone Encounter (Signed)
Rx was sent to [facility] on 03/06/23 per pt request.  Requested Prescriptions  Refused Prescriptions Disp Refills   Insulin Lispro Prot & Lispro (HUMALOG MIX 75/25 KWIKPEN) (75-25) 100 UNIT/ML [employer] Med Name: HUMALOG MIX 75/25 KWIKPEN (75-25) 1] 15 mL 1    Sig: INJECT 18 UNITS SUBCUTANEOUSLY EVERY MORNING AND 20 UNITS EVERY EVENING     Endocrinology:  Diabetes - Insulins Passed - 03/09/2023  5:55 PM      Passed - HBA1C is between 0 and 7.9 and within 180 days    HB A1C (BAYER DCA - WAIVED)  Date Value Ref Range Status  12/28/2022 7.4 (H) 4.8 - 5.6 % Final    Comment:             Prediabetes: 5.7 - 6.4          Diabetes: >6.4          Glycemic control for adults with diabetes: <7.0          Passed - Valid encounter within last 6 months    Recent Outpatient Visits           1 week ago Gastroesophageal reflux disease, unspecified whether esophagitis present   [place] [facility] [place], Megan P, DO   2 months ago Type 2 diabetes mellitus with hyperglycemia, with long-term current use of insulin (HCC)   [place] [facility] [place], Megan P, DO   3 months ago Controlled type 2 diabetes mellitus with microalbuminuria, with long-term current use of insulin (HCC)   [place] [facility] [place], Megan P, DO   4 months ago Essential hypertension   [place] [place], [place], DO   4 months ago Dizziness   [place] [facility] [place], [place], DO       Future Appointments             In 3 weeks Laural Benes, Oralia Rud, DO  [facility]   In 1 month [place], Vincent, PA-C [facility]

## 2023-03-13 ENCOUNTER — Telehealth: Payer: Self-pay | Admitting: *Deleted

## 2023-03-13 ENCOUNTER — Telehealth: Payer: Self-pay | Admitting: Family Medicine

## 2023-03-13 NOTE — Telephone Encounter (Signed)
Copied from CRM [id number]. Topic: General - Other >> Mar 13, 2023 12:33 PM Jannifer Rodney M wrote: Reason for CRM: Pt requests call back from the social worker. Cb# [phone]

## 2023-03-13 NOTE — Patient Outreach (Signed)
  Care Coordination   Follow Up Visit Note   03/13/2023 Name: Vanessa Hogan MRN: [id number] DOB: June 26, 1941  Vanessa Hogan is a 82 y.o. year old female who sees Vanessa Carrow, DO for primary care. I spoke with  Vanessa Hogan by phone today.  What matters to the patients health and wellness today?  Patient concerned that her medication costs seem to fluctuate. Patient encouraged to contact her providers office regarding medication concerns. Per patient, she has reached out to her providers office, pharmacy insurance company and [place]. Per patient, she just wanted to make this social worker aware of the situation.   Goals Addressed             This Visit's Progress    Fluctuating costs of medication       Interventions Today    Flowsheet Row Most Recent Value  Chronic Disease   Chronic disease during today's visit Diabetes  Pharmacy Interventions   Pharmacy Dicussed/Reviewed Affording Medications  [Pt concerned about fluctuating cost of her novolog, went from 35.00 to 75.00-per patient Rx will need to be written a certain way to get the medication for 35.00-patient has discussed this with providers offic, [employer] and reported the discrepency to [employer]]              SDOH assessments and interventions completed:  No     Care Coordination Interventions:  Yes, provided   Follow up plan: No further intervention required.   Encounter Outcome:  Pt. Visit Completed

## 2023-03-13 NOTE — Patient Instructions (Signed)
Visit Information  Thank you for taking time to visit with me today. Please don't hesitate to contact me if I can be of assistance to you.   Following are the goals we discussed today:   Goals Addressed             This Visit's Progress    Fluctuating costs of medication       Interventions Today    Flowsheet Row Most Recent Value  Chronic Disease   Chronic disease during today's visit Diabetes  Pharmacy Interventions   Pharmacy Dicussed/Reviewed Affording Medications  [Pt concerned about fluctuating cost of her novolog, went from 35.00 to 75.00-per patient Rx will need to be written a certain way to get the medication for 35.00-patient has discussed this with providers offic, [employer] and reported the discrepency to DHHS]                If you are experiencing a Mental Health or Behavioral Health Crisis or need someone to talk to, please call 911   Patient verbalizes understanding of instructions and care plan provided today and agrees to view in MyChart. Active MyChart status and patient understanding of how to access instructions and care plan via MyChart confirmed with patient.     No further follow up required: patient will continue to follow up with her providers office regarding her medication concerns  Verna Czech, LCSW Clinical Social Worker  [facility] Care Management [phone]

## 2023-03-21 ENCOUNTER — Telehealth: Payer: Self-pay | Admitting: Family Medicine

## 2023-03-21 ENCOUNTER — Other Ambulatory Visit: Payer: Self-pay

## 2023-03-21 DIAGNOSIS — E119 Type 2 diabetes mellitus without complications: Secondary | ICD-10-CM

## 2023-03-21 MED ORDER — BD PEN NEEDLE NANO 2ND GEN 32G X 4 MM MISC: 1.0000 | Freq: Three times a day (TID) | 3 refills | Status: AC

## 2023-03-21 MED ORDER — SUCRALFATE 1 G PO TABS: 1.0000 g | ORAL_TABLET | Freq: Three times a day (TID) | ORAL | 1 refills | Status: DC

## 2023-03-21 NOTE — Telephone Encounter (Unsigned)
Copied from CRM [id number]. Topic: General - Other >> Mar 21, 2023  8:12 AM Franchot Heidelberg wrote: Reason for CRM: [facility] called to report that a fax request is coming through with 3 med refill requests. Verified fax number

## 2023-03-26 ENCOUNTER — Other Ambulatory Visit: Payer: Self-pay | Admitting: *Deleted

## 2023-03-26 DIAGNOSIS — Z86010 Personal history of colonic polyps: Secondary | ICD-10-CM

## 2023-03-26 DIAGNOSIS — R1013 Epigastric pain: Secondary | ICD-10-CM

## 2023-03-27 ENCOUNTER — Encounter: Payer: Self-pay | Admitting: Gastroenterology

## 2023-03-28 ENCOUNTER — Ambulatory Visit
Admission: RE | Admit: 2023-03-28 | Discharge: 2023-03-28 | Disposition: A | Discharge status: Home / Self Care | Payer: Medicare PPO | Attending: Gastroenterology | Admitting: Gastroenterology

## 2023-03-28 ENCOUNTER — Other Ambulatory Visit: Payer: Self-pay

## 2023-03-28 ENCOUNTER — Encounter
Admission: RE | Disposition: A | Discharge status: Home / Self Care | Payer: Self-pay | Source: Home / Self Care | Attending: Gastroenterology

## 2023-03-28 ENCOUNTER — Encounter: Payer: Self-pay | Admitting: Gastroenterology

## 2023-03-28 ENCOUNTER — Ambulatory Visit: Payer: Medicare PPO | Admitting: Anesthesiology

## 2023-03-28 DIAGNOSIS — R1013 Epigastric pain: Secondary | ICD-10-CM

## 2023-03-28 DIAGNOSIS — R1030 Lower abdominal pain, unspecified: Secondary | ICD-10-CM | POA: Insufficient documentation

## 2023-03-28 DIAGNOSIS — K5730 Diverticulosis of large intestine without perforation or abscess without bleeding: Secondary | ICD-10-CM | POA: Insufficient documentation

## 2023-03-28 DIAGNOSIS — K635 Polyp of colon: Secondary | ICD-10-CM

## 2023-03-28 DIAGNOSIS — K317 Polyp of stomach and duodenum: Secondary | ICD-10-CM

## 2023-03-28 DIAGNOSIS — K449 Diaphragmatic hernia without obstruction or gangrene: Secondary | ICD-10-CM | POA: Diagnosis not present

## 2023-03-28 DIAGNOSIS — K644 Residual hemorrhoidal skin tags: Secondary | ICD-10-CM | POA: Insufficient documentation

## 2023-03-28 DIAGNOSIS — R1084 Generalized abdominal pain: Secondary | ICD-10-CM | POA: Diagnosis not present

## 2023-03-28 DIAGNOSIS — D123 Benign neoplasm of transverse colon: Secondary | ICD-10-CM | POA: Insufficient documentation

## 2023-03-28 DIAGNOSIS — D120 Benign neoplasm of cecum: Secondary | ICD-10-CM | POA: Insufficient documentation

## 2023-03-28 DIAGNOSIS — Z86010 Personal history of colonic polyps: Secondary | ICD-10-CM | POA: Diagnosis not present

## 2023-03-28 HISTORY — PX: COLONOSCOPY WITH PROPOFOL: SHX5780

## 2023-03-28 HISTORY — PX: ESOPHAGOGASTRODUODENOSCOPY (EGD) WITH PROPOFOL: SHX5813

## 2023-03-28 LAB — GLUCOSE, CAPILLARY: Glucose-Capillary: 102 mg/dL — ABNORMAL HIGH (ref 70–99)

## 2023-03-28 SURGERY — ESOPHAGOGASTRODUODENOSCOPY (EGD) WITH PROPOFOL
Anesthesia: General

## 2023-03-28 MED ORDER — PHENYLEPHRINE HCL (PRESSORS) 10 MG/ML IV SOLN
INTRAVENOUS | Status: DC | PRN
  Administered 2023-03-28: 80 ug via INTRAVENOUS
  Administered 2023-03-28: 240 ug via INTRAVENOUS

## 2023-03-28 MED ORDER — PROPOFOL 10 MG/ML IV BOLUS
INTRAVENOUS | Status: DC | PRN
  Administered 2023-03-28: 30 mg via INTRAVENOUS
  Administered 2023-03-28: 70 mg via INTRAVENOUS
  Administered 2023-03-28: 30 mg via INTRAVENOUS
  Administered 2023-03-28: 20 mg via INTRAVENOUS

## 2023-03-28 MED ORDER — SODIUM CHLORIDE 0.9 % IV SOLN: INTRAVENOUS | Status: DC

## 2023-03-28 MED ORDER — LIDOCAINE HCL (CARDIAC) PF 100 MG/5ML IV SOSY
PREFILLED_SYRINGE | INTRAVENOUS | Status: DC | PRN
  Administered 2023-03-28: 50 mg via INTRAVENOUS

## 2023-03-28 MED ORDER — PROPOFOL 1000 MG/100ML IV EMUL
INTRAVENOUS | Status: AC
  Filled 2023-03-28: qty 100

## 2023-03-28 MED ORDER — PROPOFOL 500 MG/50ML IV EMUL
INTRAVENOUS | Status: DC | PRN
  Administered 2023-03-28: 150 ug/kg/min via INTRAVENOUS

## 2023-03-28 NOTE — Anesthesia Postprocedure Evaluation (Signed)
Anesthesia Post Note  Patient: Vanessa Hogan  Procedure(s) Performed: ESOPHAGOGASTRODUODENOSCOPY (EGD) WITH PROPOFOL COLONOSCOPY WITH PROPOFOL  Patient location during evaluation: PACU Anesthesia Type: General Level of consciousness: awake and alert, oriented and patient cooperative Pain management: pain level controlled Vital Signs Assessment: post-procedure vital signs reviewed and stable Respiratory status: spontaneous breathing, nonlabored ventilation and respiratory function stable Cardiovascular status: blood pressure returned to baseline and stable Postop Assessment: adequate PO intake Anesthetic complications: no   No notable events documented.   Last Vitals:  Vitals:   03/28/23 1037 03/28/23 1049  BP: (!) 95/46 136/62  Pulse: 71 67  Resp: 16 13  Temp:    SpO2: 97% 99%    Last Pain:  Vitals:   03/28/23 1037  TempSrc:   PainSc: 0-No pain                 Reed Breech

## 2023-03-28 NOTE — Anesthesia Procedure Notes (Signed)
Date/Time: 03/28/2023 9:35 AM  Performed by: Ginger Carne, CRNAPre-anesthesia Checklist: Patient identified, Emergency Drugs available, Suction available, Patient being monitored and Timeout performed Patient Re-evaluated:Patient Re-evaluated prior to induction Oxygen Delivery Method: Nasal cannula Preoxygenation: Pre-oxygenation with 100% oxygen Induction Type: IV induction

## 2023-03-28 NOTE — Op Note (Signed)
[facility] Gastroenterology Patient Name: Vanessa Hogan Procedure Date: 03/28/2023 9:21 AM MRN: [id number] Account #: [id number] Date of Birth: 1940/11/24 Admit Type: Outpatient Age: 82 Room: [facility] ENDO ROOM 2 Gender: Female Note Status: Finalized Instrument Name: Peds Colonoscope [id number] Procedure:             Colonoscopy Indications:           Lower abdominal pain Providers:             Toney Reil MD, MD Referring MD:          Dorcas Carrow (Referring MD) Medicines:             General Anesthesia Complications:         No immediate complications. Estimated blood loss: None. Procedure:             Pre-Anesthesia Assessment:                        - Prior to the procedure, a History and Physical was                         performed, and patient medications and allergies were                         reviewed. The patient is competent. The risks and                         benefits of the procedure and the sedation options and                         risks were discussed with the patient. All questions                         were answered and informed consent was obtained.                         Patient identification and proposed procedure were                         verified by the physician, the nurse, the                         anesthesiologist, the anesthetist and the technician                         in the pre-procedure area in the procedure room in the                         endoscopy suite. Mental Status Examination: alert and                         oriented. Airway Examination: normal oropharyngeal                         airway and neck mobility. Respiratory Examination:                         clear to auscultation. CV Examination: normal.  Prophylactic Antibiotics: The patient does not require                         prophylactic antibiotics. Prior Anticoagulants: The                         patient has  taken no anticoagulant or antiplatelet                         agents. ASA Grade Assessment: III - A patient with                         severe systemic disease. After reviewing the risks and                         benefits, the patient was deemed in satisfactory                         condition to undergo the procedure. The anesthesia                         plan was to use general anesthesia. Immediately prior                         to administration of medications, the patient was                         re-assessed for adequacy to receive sedatives. The                         heart rate, respiratory rate, oxygen saturations,                         blood pressure, adequacy of pulmonary ventilation, and                         response to care were monitored throughout the                         procedure. The physical status of the patient was                         re-assessed after the procedure.                        After obtaining informed consent, the colonoscope was                         passed under direct vision. Throughout the procedure,                         the patient's blood pressure, pulse, and oxygen                         saturations were monitored continuously. The                         Colonoscope was introduced through the anus and  advanced to the the terminal ileum, with                         identification of the appendiceal orifice and IC                         valve. The colonoscopy was performed with moderate                         difficulty due to significant looping and the                         patient's body habitus. Successful completion of the                         procedure was aided by applying abdominal pressure.                         The patient tolerated the procedure well. The quality                         of the bowel preparation was evaluated using the BBPS                         [facility] Bowel  Preparation Scale) with scores of: Right                         Colon = 3, Transverse Colon = 3 and Left Colon = 3                         (entire mucosa seen well with no residual staining,                         small fragments of stool or opaque liquid). The total                         BBPS score equals 9. The terminal ileum, ileocecal                         valve, appendiceal orifice, and rectum were                         photographed. Findings:      The perianal and digital rectal examinations were normal. Pertinent       negatives include normal sphincter tone and no palpable rectal lesions.      The terminal ileum appeared normal.      Two sessile polyps were found in the transverse colon and cecum. The       polyps were 4 to 5 mm in size. These polyps were removed with a cold       snare. Resection and retrieval were complete. Estimated blood loss: none.      A few small-mouthed diverticula were found in the sigmoid colon.      Non-bleeding external hemorrhoids were found during retroflexion. The       hemorrhoids were medium-sized. Impression:            - The examined portion of the ileum was  normal.                        - Two 4 to 5 mm polyps in the transverse colon and in                         the cecum, removed with a cold snare. Resected and                         retrieved.                        - Diverticulosis in the sigmoid colon.                        - Non-bleeding external hemorrhoids. Recommendation:        - Discharge patient to home (with escort).                        - Resume previous diet today.                        - Continue present medications.                        - Await pathology results.                        - Return to GI clinic as previously scheduled. Procedure Code(s):     --- Professional ---                        [phone], Colonoscopy, flexible; with removal of                         tumor(s), polyp(s), or other lesion(s) by  snare                         technique Diagnosis Code(s):     --- Professional ---                        K64.4, Residual hemorrhoidal skin tags                        D12.3, Benign neoplasm of transverse colon (hepatic                         flexure or splenic flexure)                        D12.0, Benign neoplasm of cecum                        K57.30, Diverticulosis of large intestine without                         perforation or abscess without bleeding                        R10.30, Lower abdominal pain, unspecified CPT copyright 2022 American Medical Association. All rights reserved. The codes documented in this report are preliminary  and upon coder review may  be revised to meet current compliance requirements. Dr. Libby Maw Toney Reil MD, MD 03/28/2023 10:24:05 AM This report has been signed electronically. Number of Addenda: 0 Note Initiated On: 03/28/2023 9:21 AM Scope Withdrawal Time: 0 hours 11 minutes 57 seconds  Total Procedure Duration: 0 hours 16 minutes 18 seconds  Estimated Blood Loss:  Estimated blood loss: none.      [facility]

## 2023-03-28 NOTE — H&P (Signed)
Arlyss Repress, MD [address]  [address]  [place], [place] [address]  Main: [phone]  Fax: [phone] Pager: [phone]  Primary Care Physician:  Dorcas Carrow, DO Primary Gastroenterologist:  Dr. Arlyss Repress  Pre-Procedure History & Physical: HPI:  Vanessa Hogan is a 82 y.o. female is here for an endoscopy and colonoscopy.   Past Medical History:  Diagnosis Date   Anxiety    Depression    Diabetes mellitus without complication (HCC)    GERD (gastroesophageal reflux disease)    Gout    Hypertension    Peptic ulcer 2023    Past Surgical History:  Procedure Laterality Date   ABDOMINAL HYSTERECTOMY     BREAST SURGERY     HERNIA REPAIR     TUBAL LIGATION      Prior to Admission medications   Medication Sig Start Date End Date Taking? Authorizing Provider  cloNIDine (CATAPRES) 0.2 MG tablet Take 1 tablet (0.2 mg total) by mouth daily. 12/28/22  Yes Johnson, Megan P, DO  Insulin Lispro Prot & Lispro (HUMALOG MIX 75/25 KWIKPEN) (75-25) 100 UNIT/ML Kwikpen INJECT 18 UNITS SUBCUTANEOUSLY EVERY MORNING AND 20 UNITS EVERY EVENING 03/07/23  Yes Johnson, Megan P, DO  valsartan-hydrochlorothiazide (DIOVAN-HCT) 160-12.5 MG tablet Take 1 tablet by mouth daily.   Yes [provider]  ACCU-CHEK GUIDE test strip 1 each by Other route 3 (three) times daily. 06/09/22   Olevia Perches P, DO  Accu-Chek Softclix Lancets lancets SMARTSIG:Topical 11/08/21   [provider]  allopurinol (ZYLOPRIM) 100 MG tablet TAKE 1 TABLET(100 MG) BY MOUTH DAILY 09/19/22   Olevia Perches P, DO  celecoxib (CELEBREX) 100 MG capsule Take 1 capsule (100 mg total) by mouth daily. 11/29/22   Johnson, Megan P, DO  fluticasone (FLONASE) 50 MCG/ACT nasal spray Place into both nostrils. 05/29/22   [provider]  Insulin Pen Needle (BD PEN NEEDLE NANO 2ND GEN) 32G X 4 MM MISC 1 each by Does not apply route in the morning, at noon, and at bedtime. 03/21/23   Olevia Perches P,  DO  Insulin Pen Needle 32G X 4 MM MISC Use to inject insulin twice daily 02/16/23   Olevia Perches P, DO  lidocaine (XYLOCAINE) 2 % solution Use as directed 15 mLs in the mouth or throat as needed (chest pain). 02/19/23   Phineas Semen, MD  loratadine (CLARITIN) 10 MG tablet Take 10 mg by mouth daily as needed. 03/09/20   [provider]  LORazepam (ATIVAN) 0.5 MG tablet Take 1 tablet (0.5 mg total) by mouth as needed for anxiety. 09/28/22   Mecum, Erin E, PA-C  metFORMIN (GLUCOPHAGE-XR) 500 MG 24 hr tablet Take 500 mg by mouth daily as needed. Patient not taking: Reported on 03/28/2023    [provider]  pantoprazole (PROTONIX) 40 MG tablet Take 40 mg by mouth daily. 07/20/22   [provider]  rosuvastatin (CRESTOR) 10 MG tablet Take 1 tablet (10 mg total) by mouth daily. Patient not taking: Reported on 03/28/2023 02/06/22   Loura Pardon, MD  Semaglutide,0.25 or 0.5MG /DOS, (OZEMPIC, 0.25 OR 0.5 MG/DOSE,) 2 MG/3ML SOPN 0.25mg  weekly for 1 month, then increase to 0.5mg  weekly 11/05/22   Olevia Perches P, DO  sucralfate (CARAFATE) 1 g tablet Take 1 tablet (1 g total) by mouth 4 (four) times daily -  with meals and at bedtime. 03/21/23   Johnson, Megan P, DO  sucralfate (CARAFATE) 1 GM/10ML suspension Take 10 mLs (1 g total) by mouth  4 (four) times daily. Patient not taking: Reported on 03/07/2023 02/19/23 02/19/24  Phineas Semen, MD  triamcinolone ointment (KENALOG) 0.5 % Apply 1 Application topically 2 (two) times daily. 02/27/23   Olevia Perches P, DO    Allergies as of 02/26/2023 - Review Complete 02/26/2023  Allergen Reaction Noted   Asa [aspirin]  07/04/2021   Metformin and related Other (See Comments) 11/01/2022   Morphine and codeine  07/04/2021   Nitroglycerin Other (See Comments) 03/05/2020   Penicillins Hives 07/04/2021   Sertraline Other (See Comments) 05/19/2020    Family History  Problem Relation Age of Onset   Stroke Mother    Stroke Father    Dementia  Sister    Hypertension Brother    Diabetes Daughter    Hypertension Son     Social History   Socioeconomic History   Marital status: Married    Spouse name: Not on file   Number of children: Not on file   Years of education: Not on file   Highest education level: Not on file  Occupational History   Not on file  Tobacco Use   Smoking status: Former    Packs/day: 0.25    Years: 5.00    Additional pack years: 0.00    Total pack years: 1.25    Types: Cigarettes    Quit date: 1972    Years since quitting: 52.4   Smokeless tobacco: Former  Building services engineer Use: Never used  Substance and Sexual Activity   Alcohol use: Yes    Alcohol/week: 1.0 standard drink of alcohol    Types: 1 Glasses of wine per week    Comment: occasionally   Drug use: Never   Sexual activity: Not Currently    Birth control/protection: None  Other Topics Concern   Not on file  Social History Narrative   Not on file   Social Determinants of Health   Financial Resource Strain: Medium Risk (01/02/2023)   Overall Financial Resource Strain (CARDIA)    Difficulty of Paying Living Expenses: Somewhat hard  Food Insecurity: Food Insecurity Present (01/02/2023)   Hunger Vital Sign    Worried About Running Out of Food in the Last Year: Sometimes true    Ran Out of Food in the Last Year: Sometimes true  Transportation Needs: No Transportation Needs (01/02/2023)   PRAPARE - Administrator, Civil Service (Medical): No    Lack of Transportation (Non-Medical): No  Physical Activity: Insufficiently Active (01/02/2023)   Exercise Vital Sign    Days of Exercise per Week: 7 days    Minutes of Exercise per Session: 10 min  Stress: No Stress Concern Present (12/26/2022)   [employer] of Occupational Health - Occupational Stress Questionnaire    Feeling of Stress : Only a little  Social Connections: Moderately Isolated (12/26/2022)   Social Connection and Isolation Panel [NHANES]    Frequency of  Communication with Friends and Family: More than three times a week    Frequency of Social Gatherings with Friends and Family: Three times a week    Attends Religious Services: More than 4 times per year    Active Member of Clubs or Organizations: No    Attends Banker Meetings: Never    Marital Status: Separated  Intimate Partner Violence: Not At Risk (12/26/2022)   Humiliation, Afraid, Rape, and Kick questionnaire    Fear of Current or Ex-Partner: No    Emotionally Abused: No    Physically Abused: No  Sexually Abused: No    Review of Systems: See HPI, otherwise negative ROS  Physical Exam: BP (!) 187/89   Pulse 75   Temp (!) 97.1 F (36.2 C) (Temporal)   Resp 16   Wt 95.7 kg   SpO2 100%   BMI 35.11 kg/m  General:   Alert,  pleasant and cooperative in NAD Head:  Normocephalic and atraumatic. Neck:  Supple; no masses or thyromegaly. Lungs:  Clear throughout to auscultation.    Heart:  Regular rate and rhythm. Abdomen:  Soft, nontender and nondistended. Normal bowel sounds, without guarding, and without rebound.   Neurologic:  Alert and  oriented x4;  grossly normal neurologically.  Impression/Plan: Vanessa Hogan is here for an upper endoscopy and colonoscopy to be performed for recurrent episodes of lower abdominal discomfort stated with severe abdominal bloating   Risks, benefits, limitations, and alternatives regarding  endoscopy and colonoscopy have been reviewed with the patient.  Questions have been answered.  All parties agreeable.   Lannette Donath, MD  03/28/2023, 9:19 AM

## 2023-03-28 NOTE — Anesthesia Preprocedure Evaluation (Addendum)
Anesthesia Evaluation  Patient identified by MRN, date of birth, ID band Patient awake    Reviewed: Allergy & Precautions, NPO status , Patient's Chart, lab work & pertinent test results  History of Anesthesia Complications Negative for: history of anesthetic complications  Airway Mallampati: IV   Neck ROM: Full    Dental  (+) Missing, Loose, Chipped   Pulmonary former smoker (quit 1972)   Pulmonary exam normal breath sounds clear to auscultation       Cardiovascular hypertension, Normal cardiovascular exam Rhythm:Regular Rate:Normal  ECG 02/19/23:  Sinus rhythm Prolonged PR interval Consider left atrial enlargement Right bundle branch block   Neuro/Psych  PSYCHIATRIC DISORDERS Anxiety Depression       GI/Hepatic PUD,GERD  ,,  Endo/Other  diabetes, Type 2    Renal/GU negative Renal ROS     Musculoskeletal  (+) Arthritis ,  Gout    Abdominal   Peds  Hematology negative hematology ROS (+)   Anesthesia Other Findings Last dose of Ozempic 7 days ago.   Cardiology note 03/05/23:  82 y.o. female with  1. Primary hypertension  2. Chest pain with low risk for cardiac etiology  3. SOB (shortness of breath) on exertion  4. Vasovagal syncope   81 year old female with longstanding history of intermittent chest pain, with typical and atypical features, felt to be at least partially due to anxiety, with recent evaluation at [facility] ED, with negative chest CT. 2D echocardiogram 05/22/2022 revealed normal left ventricular function, with LVEF greater than 55%. The patient was again seen at [facility] ED 01/28/2023 with near syncope, felt to be likely due to dehydration and urinary tract infection. 72-hour Holter monitor/07/2023-/13/2024 revealed predominant sinus rhythm, with 1 episode of type I second-degree AV block. She has essential hypertension, diastolic blood pressure mildly elevated on current BP medications.    Plan   1.  Continue current medications 2. Counseled patient about low-sodium diet 3. DASH diet printed instructions given to the patient 4. Instructed patient to keep diary of blood pressure and heart rate 5. Return to clinic for follow-up in 4 months    Reproductive/Obstetrics                             Anesthesia Physical Anesthesia Plan  ASA: 2  Anesthesia Plan: General   Post-op Pain Management:    Induction: Intravenous  PONV Risk Score and Plan: 3 and Propofol infusion, TIVA and Treatment may vary due to age or medical condition  Airway Management Planned: Natural Airway  Additional Equipment:   Intra-op Plan:   Post-operative Plan:   Informed Consent: I have reviewed the patients History and Physical, chart, labs and discussed the procedure including the risks, benefits and alternatives for the proposed anesthesia with the patient or authorized representative who has indicated his/her understanding and acceptance.       Plan Discussed with: CRNA  Anesthesia Plan Comments: (LMA/GETA backup discussed.  Patient consented for risks of anesthesia including but not limited to:  - adverse reactions to medications - damage to eyes, teeth, lips or other oral mucosa - nerve damage due to positioning  - sore throat or hoarseness - damage to heart, brain, nerves, lungs, other parts of body or loss of life  Informed patient about role of CRNA in peri- and intra-operative care.  Patient voiced understanding.)        Anesthesia Quick Evaluation

## 2023-03-28 NOTE — Op Note (Signed)
[facility] Gastroenterology Patient Name: Vanessa Hogan Procedure Date: 03/28/2023 9:22 AM MRN: [id number] Account #: [id number] Date of Birth: [birth year] Admit Type: Outpatient Age: 82 Room: [facility] ENDO ROOM 2 Gender: Female Note Status: Finalized Instrument Name: Upper Endoscope [id number] Procedure:             Upper GI endoscopy Indications:           Generalized abdominal pain Providers:             Toney Reil MD, MD Referring MD:          Dorcas Carrow (Referring MD) Medicines:             General Anesthesia Complications:         No immediate complications. Estimated blood loss: None. Procedure:             Pre-Anesthesia Assessment:                        - Prior to the procedure, a History and Physical was                         performed, and patient medications and allergies were                         reviewed. The patient is competent. The risks and                         benefits of the procedure and the sedation options and                         risks were discussed with the patient. All questions                         were answered and informed consent was obtained.                         Patient identification and proposed procedure were                         verified by the physician, the nurse, the                         anesthesiologist, the anesthetist and the technician                         in the pre-procedure area in the procedure room in the                         endoscopy suite. Mental Status Examination: alert and                         oriented. Airway Examination: normal oropharyngeal                         airway and neck mobility. Respiratory Examination:                         clear to auscultation. CV Examination: normal.  Prophylactic Antibiotics: The patient does not require                         prophylactic antibiotics. Prior Anticoagulants: The                          patient has taken no anticoagulant or antiplatelet                         agents. ASA Grade Assessment: III - A patient with                         severe systemic disease. After reviewing the risks and                         benefits, the patient was deemed in satisfactory                         condition to undergo the procedure. The anesthesia                         plan was to use general anesthesia. Immediately prior                         to administration of medications, the patient was                         re-assessed for adequacy to receive sedatives. The                         heart rate, respiratory rate, oxygen saturations,                         blood pressure, adequacy of pulmonary ventilation, and                         response to care were monitored throughout the                         procedure. The physical status of the patient was                         re-assessed after the procedure.                        After obtaining informed consent, the endoscope was                         passed under direct vision. Throughout the procedure,                         the patient's blood pressure, pulse, and oxygen                         saturations were monitored continuously. The Endoscope                         was introduced through the mouth, and advanced to the  second part of duodenum. The upper GI endoscopy was                         accomplished without difficulty. The patient tolerated                         the procedure well. Findings:      A single 10 mm sessile polyp with no bleeding was found in the duodenal       bulb. The polyp was removed with a hot snare. Resection was partial and       retrieval were complete. Estimated blood loss: none.      The second portion of the duodenum was normal.      Multiple sessile polyps of varying sizes with no bleeding and no       stigmata of recent bleeding were found in the gastric  fundus, cardia and       in the gastric body. Biopsies were taken with a cold forceps for       histology.      The incisura and gastric antrum were normal. Biopsies were taken with a       cold forceps for histology.      A small hiatal hernia was present.      The gastroesophageal junction and examined esophagus were normal. Impression:            - A single duodenal polyp. Resected and retrieved.                        - Normal second portion of the duodenum.                        - Multiple gastric polyps. Biopsied.                        - Normal incisura and antrum. Biopsied.                        - Small hiatal hernia.                        - Normal gastroesophageal junction and esophagus. Recommendation:        - Await pathology results.                        - Follow an antireflux regimen for the rest of the                         patient's life.                        - Proceed with colonoscopy as scheduled                        See colonoscopy report Procedure Code(s):     --- Professional ---                        [phone], Esophagogastroduodenoscopy, flexible,                         transoral; with removal of tumor(s), polyp(s), or  other lesion(s) by snare technique                        [id number], 59, Esophagogastroduodenoscopy, flexible,                         transoral; with biopsy, single or multiple Diagnosis Code(s):     --- Professional ---                        K31.7, Polyp of stomach and duodenum                        K44.9, Diaphragmatic hernia without obstruction or                         gangrene                        R10.84, Generalized abdominal pain CPT copyright 2022 American Medical Association. All rights reserved. The codes documented in this report are preliminary and upon coder review may  be revised to meet current compliance requirements. Dr. Libby Maw Toney Reil MD, MD 03/28/2023 10:03:21 AM This report  has been signed electronically. Number of Addenda: 0 Note Initiated On: 03/28/2023 9:22 AM Estimated Blood Loss:  Estimated blood loss: none.      [facility]

## 2023-03-28 NOTE — Transfer of Care (Signed)
Immediate Anesthesia Transfer of Care Note  Patient: Vanessa Hogan  Procedure(s) Performed: ESOPHAGOGASTRODUODENOSCOPY (EGD) WITH PROPOFOL COLONOSCOPY WITH PROPOFOL  Patient Location: Endoscopy Unit  Anesthesia Type:General  Level of Consciousness: awake and alert   Airway & Oxygen Therapy: Patient Spontanous Breathing  Post-op Assessment: Report given to RN and Post -op Vital signs reviewed and stable  Post vital signs: Reviewed and stable  Last Vitals:  Vitals Value Taken Time  BP 96/41 03/28/23 1028  Temp    Pulse 76 03/28/23 1028  Resp 16 03/28/23 1028  SpO2 97 % 03/28/23 1028  Vitals shown include unvalidated device data.  Last Pain:  Vitals:   03/28/23 1028  TempSrc:   PainSc: 0-No pain         Complications: No notable events documented.

## 2023-03-29 ENCOUNTER — Encounter: Payer: Self-pay | Admitting: Gastroenterology

## 2023-04-02 ENCOUNTER — Ambulatory Visit: Payer: Medicare PPO | Admitting: Family Medicine

## 2023-04-13 IMAGING — CR DG CHEST 2V
2 series · 2 of 2 positions shown · non-contrast
Comparison: None.

CLINICAL DATA: 79-year-old female with right side chest pain

EXAM:
CHEST - 2 VIEW

[chest pa]
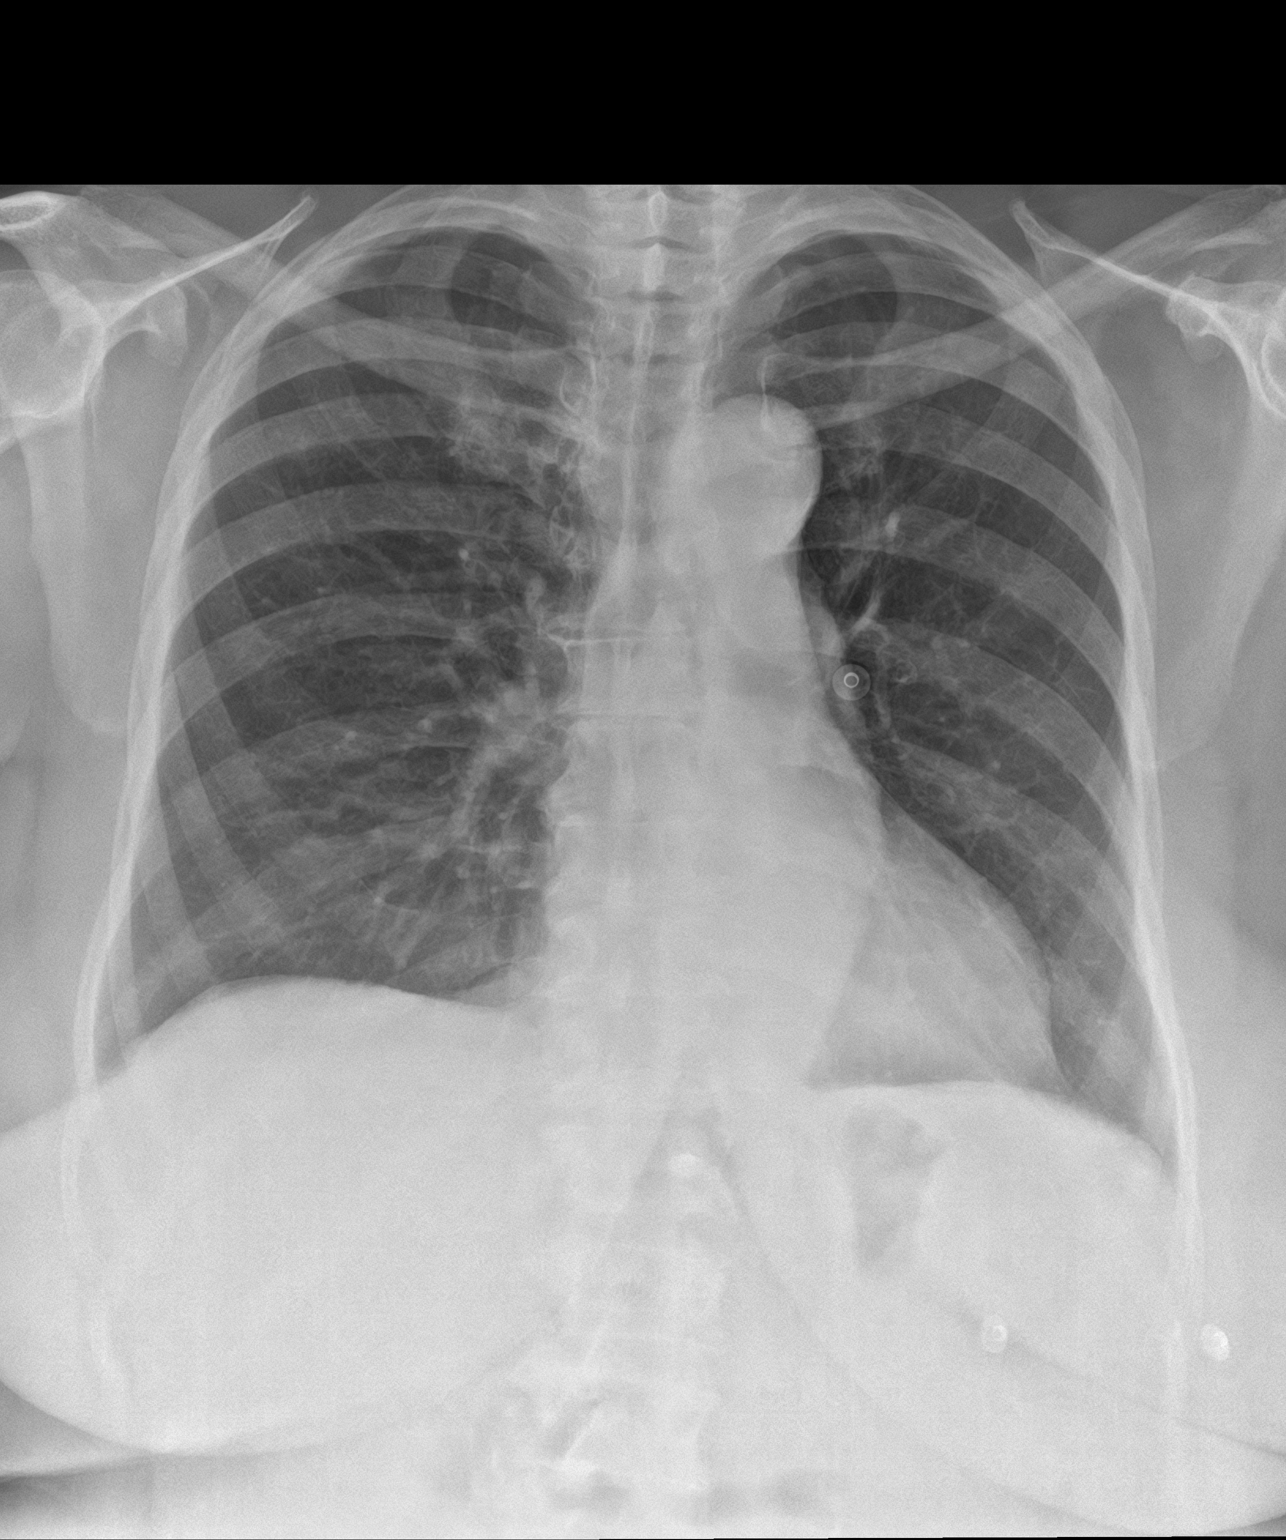

[chest lat]
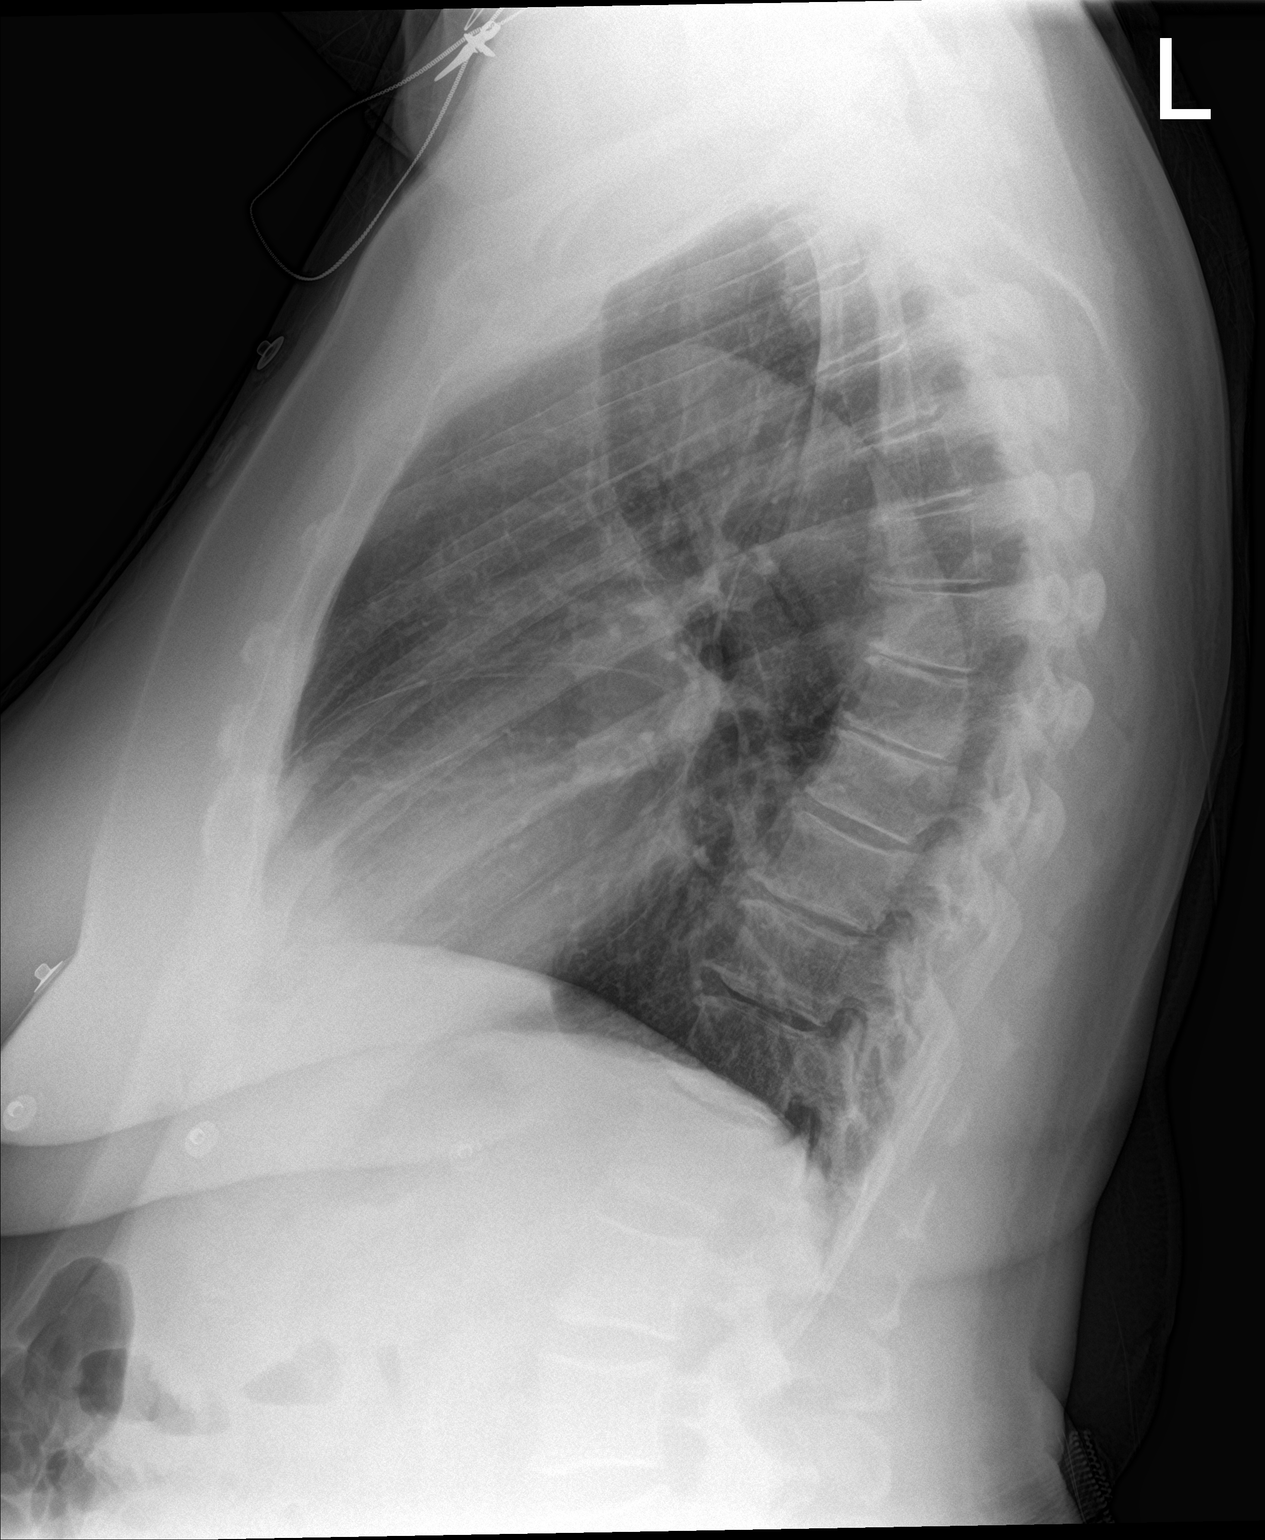

[2 of 2 positions shown; findings below may reference images not displayed]

FINDINGS: Normal lung volumes. Tortuous thoracic aorta. Other mediastinal
contours are within normal limits. Visualized tracheal air column is
within normal limits. Lung markings are within normal limits. No
pneumothorax, pulmonary edema, pleural effusion or confluent
pulmonary opacity. Mild thoracic kyphoscoliosis. No acute osseous
abnormality identified. Negative visible bowel gas.
IMPRESSION: No acute cardiopulmonary abnormality.

## 2023-04-19 ENCOUNTER — Other Ambulatory Visit: Payer: Self-pay | Admitting: Family Medicine

## 2023-04-20 ENCOUNTER — Other Ambulatory Visit: Payer: Medicare PPO

## 2023-04-20 ENCOUNTER — Ambulatory Visit (INDEPENDENT_AMBULATORY_CARE_PROVIDER_SITE_OTHER): Payer: Medicare PPO | Admitting: Physician Assistant

## 2023-04-20 ENCOUNTER — Encounter: Payer: Self-pay | Admitting: Physician Assistant

## 2023-04-20 VITALS — BP 157/61 | HR 66 | Temp 98.5°F | Ht 66.0 in | Wt 208.4 lb

## 2023-04-20 DIAGNOSIS — R350 Frequency of micturition: Secondary | ICD-10-CM

## 2023-04-20 DIAGNOSIS — E1169 Type 2 diabetes mellitus with other specified complication: Secondary | ICD-10-CM | POA: Diagnosis not present

## 2023-04-20 DIAGNOSIS — K219 Gastro-esophageal reflux disease without esophagitis: Secondary | ICD-10-CM | POA: Diagnosis not present

## 2023-04-20 DIAGNOSIS — Z7689 Persons encountering health services in other specified circumstances: Secondary | ICD-10-CM | POA: Diagnosis not present

## 2023-04-20 DIAGNOSIS — F339 Major depressive disorder, recurrent, unspecified: Secondary | ICD-10-CM

## 2023-04-20 DIAGNOSIS — E669 Obesity, unspecified: Secondary | ICD-10-CM

## 2023-04-20 DIAGNOSIS — E119 Type 2 diabetes mellitus without complications: Secondary | ICD-10-CM

## 2023-04-20 DIAGNOSIS — E785 Hyperlipidemia, unspecified: Secondary | ICD-10-CM

## 2023-04-20 DIAGNOSIS — M7989 Other specified soft tissue disorders: Secondary | ICD-10-CM

## 2023-04-20 DIAGNOSIS — I10 Essential (primary) hypertension: Secondary | ICD-10-CM

## 2023-04-20 LAB — POCT URINALYSIS DIPSTICK
Bilirubin, UA: NEGATIVE
Blood, UA: NEGATIVE
Glucose, UA: NEGATIVE
Ketones, UA: NEGATIVE
Nitrite, UA: NEGATIVE
Protein, UA: NEGATIVE
Spec Grav, UA: 1.01 (ref 1.010–1.025)
Urobilinogen, UA: 0.2 E.U./dL
pH, UA: 6 (ref 5.0–8.0)

## 2023-04-20 NOTE — Progress Notes (Unsigned)
New patient visit  Patient: Vanessa Hogan   DOB: 07/30/1941   82 y.o. Female  MRN: [id number] Visit Date: 04/20/2023  Today's healthcare provider: Debera Lat, PA-C   No chief complaint on file.  Subjective    Vanessa Hogan is a 82 y.o. female who presents today as a new patient to establish care.  HPI HPI   Patient is here for a follow up for colonoscopy. Pt is feeling fine since surgery. Last edited by Daneen Schick, CMA on 04/20/2023  2:48 PM.      *** Discussed the use of AI scribe software for clinical note transcription with the patient, who gave verbal consent to proceed.  History of Present Illness   The patient, with a history of hypertension, gout, diabetes, depression, and obesity, presents to establish care after moving to a new location. They recently underwent a colonoscopy and endoscopy on June 5th and were due for a follow-up on June 10th. They report a history of high blood pressure and have been on Clonidine for years. They also have a history of gout, but have not had an episode in the last three to four months and are not currently on Allopurinol. They have been managing their diabetes with insulin and Ozempic, and their depression with Clonidine. They also take Lorazepam as needed for anxiety. They have been diagnosed with GERD and are on a daily regimen of Pantoprazole and Carafate. They report frequent urination recently and suspect a possible infection. They also note some swelling in their legs, which they attribute to sitting at their desk all day.        Past Medical History:  Diagnosis Date   Allergy    Anemia    Anxiety    Arthritis    Depression    Diabetes mellitus without complication (HCC)    GERD (gastroesophageal reflux disease)    Gout    Hypertension    Peptic ulcer 2023   Thyroid disease    Past Surgical History:  Procedure Laterality Date   BREAST SURGERY     COLONOSCOPY WITH PROPOFOL N/A 03/28/2023    Procedure: COLONOSCOPY WITH PROPOFOL;  Surgeon: Toney Reil, MD;  Location: [facility] ENDOSCOPY;  Service: Gastroenterology;  Laterality: N/A;   ESOPHAGOGASTRODUODENOSCOPY (EGD) WITH PROPOFOL N/A 03/28/2023   Procedure: ESOPHAGOGASTRODUODENOSCOPY (EGD) WITH PROPOFOL;  Surgeon: Toney Reil, MD;  Location: [facility] ENDOSCOPY;  Service: Gastroenterology;  Laterality: N/A;   TUBAL LIGATION     Family Status  Relation Name Status   Mother  Deceased   Father  Deceased   Sister  (Not Specified)   Brother  (Not Specified)   Daughter  (Not Specified)   Son  (Not Specified)   Family History  Problem Relation Age of Onset   Stroke Mother    Stroke Father    Dementia Sister    Hypertension Brother    Diabetes Daughter    Hypertension Son    Social History   Socioeconomic History   Marital status: Married    Spouse name: Not on file   Number of children: Not on file   Years of education: Not on file   Highest education level: Not on file  Occupational History   Not on file  Tobacco Use   Smoking status: Former    Packs/day: 0.25    Years: 5.00    Additional pack years: 0.00    Total pack years: 1.25    Types: Cigarettes    Quit date: 5  Years since quitting: 52.5   Smokeless tobacco: Former  Building services engineer Use: Never used  Substance and Sexual Activity   Alcohol use: Yes    Alcohol/week: 1.0 standard drink of alcohol    Types: 1 Glasses of wine per week    Comment: occasionally   Drug use: Never   Sexual activity: Not Currently    Birth control/protection: None  Other Topics Concern   Not on file  Social History Narrative   Not on file   Social Determinants of Health   Financial Resource Strain: Medium Risk (01/02/2023)   Overall Financial Resource Strain (CARDIA)    Difficulty of Paying Living Expenses: Somewhat hard  Food Insecurity: Food Insecurity Present (01/02/2023)   Hunger Vital Sign    Worried About Running Out of Food in the Last Year:  Sometimes true    Ran Out of Food in the Last Year: Sometimes true  Transportation Needs: No Transportation Needs (01/02/2023)   PRAPARE - Administrator, Civil Service (Medical): No    Lack of Transportation (Non-Medical): No  Physical Activity: Insufficiently Active (01/02/2023)   Exercise Vital Sign    Days of Exercise per Week: 7 days    Minutes of Exercise per Session: 10 min  Stress: No Stress Concern Present (12/26/2022)   [employer] of Occupational Health - Occupational Stress Questionnaire    Feeling of Stress : Only a little  Social Connections: Moderately Isolated (12/26/2022)   Social Connection and Isolation Panel [NHANES]    Frequency of Communication with Friends and Family: More than three times a week    Frequency of Social Gatherings with Friends and Family: Three times a week    Attends Religious Services: More than 4 times per year    Active Member of Clubs or Organizations: No    Attends Banker Meetings: Never    Marital Status: Separated   Outpatient Medications Prior to Visit  Medication Sig Note   ACCU-CHEK GUIDE test strip 1 each by Other route 3 (three) times daily.    Accu-Chek Softclix Lancets lancets SMARTSIG:Topical    allopurinol (ZYLOPRIM) 100 MG tablet TAKE 1 TABLET(100 MG) BY MOUTH DAILY    celecoxib (CELEBREX) 100 MG capsule Take 1 capsule (100 mg total) by mouth daily.    cloNIDine (CATAPRES) 0.2 MG tablet Take 1 tablet (0.2 mg total) by mouth daily.    fluticasone (FLONASE) 50 MCG/ACT nasal spray Place into both nostrils.    Insulin Lispro Prot & Lispro (HUMALOG MIX 75/25 KWIKPEN) (75-25) 100 UNIT/ML Kwikpen INJECT 18 UNITS SUBCUTANEOUSLY EVERY MORNING AND 20 UNITS EVERY EVENING    Insulin Pen Needle (BD PEN NEEDLE NANO 2ND GEN) 32G X 4 MM MISC 1 each by Does not apply route in the morning, at noon, and at bedtime.    Insulin Pen Needle 32G X 4 MM MISC Use to inject insulin twice daily    lidocaine (XYLOCAINE) 2 %  solution Use as directed 15 mLs in the mouth or throat as needed (chest pain).    loratadine (CLARITIN) 10 MG tablet Take 10 mg by mouth daily as needed.    LORazepam (ATIVAN) 0.5 MG tablet Take 1 tablet (0.5 mg total) by mouth as needed for anxiety.    pantoprazole (PROTONIX) 40 MG tablet Take 40 mg by mouth daily.    sucralfate (CARAFATE) 1 g tablet Take 1 tablet (1 g total) by mouth 4 (four) times daily -  with meals and at bedtime.  04/20/2023: Taking 2 times a day   triamcinolone ointment (KENALOG) 0.5 % Apply 1 Application topically 2 (two) times daily.    valsartan-hydrochlorothiazide (DIOVAN-HCT) 160-12.5 MG tablet Take 1 tablet by mouth daily.    metFORMIN (GLUCOPHAGE-XR) 500 MG 24 hr tablet Take 500 mg by mouth daily as needed. (Patient not taking: Reported on 03/28/2023)    rosuvastatin (CRESTOR) 10 MG tablet Take 1 tablet (10 mg total) by mouth daily. (Patient not taking: Reported on 03/28/2023)    Semaglutide,0.25 or 0.5MG /DOS, (OZEMPIC, 0.25 OR 0.5 MG/DOSE,) 2 MG/3ML SOPN 0.25mg  weekly for 1 month, then increase to 0.5mg  weekly    sucralfate (CARAFATE) 1 GM/10ML suspension Take 10 mLs (1 g total) by mouth 4 (four) times daily. (Patient not taking: Reported on 03/07/2023)    No facility-administered medications prior to visit.   Allergies  Allergen Reactions   Asa [Aspirin]    Metformin And Related Other (See Comments)    headache   Morphine And Codeine    Nitroglycerin Other (See Comments)    Headache   Penicillins Hives   Sertraline Other (See Comments)    Felt poorly    Immunization History  Administered Date(s) Administered   Moderna Covid-19 Vaccine Bivalent Booster 33yrs & up 08/15/2022   Moderna SARS-COV2 Booster Vaccination 01/29/2020, 02/26/2020   Moderna Sars-Covid-2 Vaccination 01/29/2020, 02/26/2020, 11/18/2020, 06/06/2021   PNEUMOCOCCAL CONJUGATE-20 09/28/2022   Pneumococcal-Unspecified 09/21/2016   Td 12/28/2022    Health Maintenance  Topic Date Due   Zoster  Vaccines- Shingrix (1 of 2) Never done   DEXA SCAN  Never done   COVID-19 Vaccine (8 - 2023-24 season) 10/10/2022   OPHTHALMOLOGY EXAM  05/20/2023   INFLUENZA VACCINE  05/24/2023   HEMOGLOBIN A1C  06/30/2023   Medicare Annual Wellness (AWV)  12/26/2023   Diabetic kidney evaluation - Urine ACR  12/28/2023   FOOT EXAM  12/28/2023   Diabetic kidney evaluation - eGFR measurement  02/19/2024   DTaP/Tdap/Td (2 - Tdap) 12/27/2032   Pneumonia Vaccine 59+ Years old  Completed   HPV VACCINES  Aged Out    Patient Care Team: Dorcas Carrow, DO as PCP - General (Family Medicine)  Review of Systems  Constitutional:  Positive for fatigue.  HENT:  Positive for dental problem, ear pain, hearing loss, rhinorrhea, sinus pressure and sneezing.   Eyes:  Positive for photophobia, redness, itching and visual disturbance.  Respiratory:  Positive for cough and shortness of breath.   Gastrointestinal:  Positive for abdominal pain.  Endocrine: Positive for heat intolerance and polydipsia.  Genitourinary:  Positive for flank pain and urgency.  Musculoskeletal:  Positive for back pain and neck stiffness.  Allergic/Immunologic: Positive for environmental allergies.  Neurological:  Positive for dizziness and light-headedness.  Psychiatric/Behavioral:  Positive for sleep disturbance. The patient is nervous/anxious.   All other systems reviewed and are negative.  Except see HPI   {Labs  Heme  Chem  Endocrine  Serology  Results Review (optional):23779}   Objective    BP (!) 157/61   Pulse 66   Temp 98.5 F (36.9 C) (Oral)   Ht 5\' 6"  (1.676 m)   Wt 208 lb 6.4 oz (94.5 kg)   SpO2 100%   BMI 33.64 kg/m  {Show previous vital signs (optional):23777}  Physical Exam Vitals reviewed.  Constitutional:      General: She is not in acute distress.    Appearance: Normal appearance. She is well-developed. She is not diaphoretic.  HENT:     Head: Normocephalic  and atraumatic.  Eyes:     General: No  scleral icterus.    Conjunctiva/sclera: Conjunctivae normal.  Neck:     Thyroid: No thyromegaly.  Cardiovascular:     Rate and Rhythm: Normal rate and regular rhythm.     Pulses: Normal pulses.     Heart sounds: Normal heart sounds. No murmur heard. Pulmonary:     Effort: Pulmonary effort is normal. No respiratory distress.     Breath sounds: Normal breath sounds. No wheezing, rhonchi or rales.  Musculoskeletal:     Cervical back: Neck supple.     Right lower leg: No edema.     Left lower leg: No edema.  Lymphadenopathy:     Cervical: No cervical adenopathy.  Skin:    General: Skin is warm and dry.     Findings: No rash.  Neurological:     Mental Status: She is alert and oriented to person, place, and time. Mental status is at baseline.  Psychiatric:        Mood and Affect: Mood normal.        Behavior: Behavior normal.     Depression Screen    04/20/2023    3:00 PM 02/27/2023   10:59 AM 01/02/2023   10:46 AM 12/28/2022   11:57 AM  PHQ 2/9 Scores  PHQ - 2 Score 0 2 0 1  PHQ- 9 Score 2 9  4    Results for orders placed or performed in visit on 04/20/23  POCT urinalysis dipstick  Result Value Ref Range   Color, UA     Clarity, UA     Glucose, UA Negative Negative   Bilirubin, UA negative    Ketones, UA negative    Spec Grav, UA 1.010 1.010 - 1.025   Blood, UA negative    pH, UA 6.0 5.0 - 8.0   Protein, UA Negative Negative   Urobilinogen, UA 0.2 0.2 or 1.0 E.U./dL   Nitrite, UA negative    Leukocytes, UA Moderate (2+) (A) Negative   Appearance     Odor      Assessment & Plan     *** Assessment and Plan    Hypertension: Elevated blood pressure readings in the office. Patient reports home readings between 130-140/80. History of syncope with increased dose of Clonidine. -Continue Clonidine twice daily. -Obtain a new home blood pressure monitor. -Record home blood pressure readings for review at next visit.  Gastroesophageal Reflux Disease (GERD): History of GERD  with recent endoscopy revealing small hiatal hernia and multiple gastric polyps. Patient is adherent to antireflux regimen. -Continue Pantoprazole and Carafate as prescribed. -Follow up on pathology results from gastric polyp biopsies.  Diverticulosis: Recent colonoscopy revealed diverticulosis and polyps in the ileum. -Monitor condition, no active intervention required at this time. -Follow up on pathology results from ileum polyp biopsies.  Diabetes Mellitus: Patient on insulin and Ozempic. -Continue current regimen.  Possible Urinary Tract Infection: Recent increase in urinary frequency and urgency. -Collect urine sample for urinalysis and culture.  Lower Extremity Edema: Noted on physical exam, patient reports sitting at desk all day. -Advise patient to elevate legs while sitting and sleeping.  Follow-up in 1 month to review home blood pressure readings, discuss urinalysis results, and assess overall health status.         Encounter to establish care Welcomed to our clinic Reviewed past medical hx, social hx, family hx and surgical hx Pt advised to send all vaccination records or screening   Return in about 4  weeks (around 05/18/2023) for BP f/u.    The patient was advised to call back or seek an in-person evaluation if the symptoms worsen or if the condition fails to improve as anticipated.  I discussed the assessment and treatment plan with the patient. The patient was provided an opportunity to ask questions and all were answered. The patient agreed with the plan and demonstrated an understanding of the instructions.  I, Debera Lat, PA-C have reviewed all documentation for this visit. The documentation on 04/20/2023 for the exam, diagnosis, procedures, and orders are all accurate and complete.  Debera Lat, [facility], MMS [facility] [phone] (phone) [phone] (fax)  [facility]

## 2023-04-20 NOTE — Telephone Encounter (Signed)
Requested medication (s) are due for refill today: Yes  Requested medication (s) are on the active medication list: Yes  Last refill:  02/16/23  Future visit scheduled:   Notes to clinic:  Historical provider.    Requested Prescriptions  Pending Prescriptions Disp Refills   valsartan-hydrochlorothiazide (DIOVAN-HCT) 160-12.5 MG tablet [Pharmacy Med Name: VALSARTAN-HCTZ 160-12.5 MG TAB] 90 tablet     Sig: Take 1 tablet by mouth daily.     Cardiovascular: ARB + Diuretic Combos Passed - 04/19/2023 11:30 AM      Passed - K in normal range and within 180 days    Potassium  Date Value Ref Range Status  02/19/2023 4.0 3.5 - 5.1 mmol/L Final         Passed - Na in normal range and within 180 days    Sodium  Date Value Ref Range Status  02/19/2023 139 135 - 145 mmol/L Final  11/29/2022 142 134 - 144 mmol/L Final         Passed - Cr in normal range and within 180 days    Creatinine, Ser  Date Value Ref Range Status  02/19/2023 0.91 0.44 - 1.00 mg/dL Final         Passed - eGFR is 10 or above and within 180 days    GFR, Estimated  Date Value Ref Range Status  02/19/2023 >60 >60 mL/min Final    Comment:    (NOTE) Calculated using the CKD-EPI Creatinine Equation (2021)    eGFR  Date Value Ref Range Status  11/29/2022 66 >59 mL/min/1.73 Final         Passed - Patient is not pregnant      Passed - Last BP in normal range    BP Readings from Last 1 Encounters:  03/28/23 136/62         Passed - Valid encounter within last 6 months    Recent Outpatient Visits           1 month ago Gastroesophageal reflux disease, unspecified whether esophagitis present   [place] [facility] [place], Megan P, DO   3 months ago Type 2 diabetes mellitus with hyperglycemia, with long-term current use of insulin (HCC)   [facility] [place], Megan P, DO   4 months ago Controlled type 2 diabetes mellitus with microalbuminuria, with long-term current  use of insulin (HCC)   [place] [facility] [place], [place], DO   5 months ago Essential hypertension   [place] [facility] [place], [place], DO   6 months ago Dizziness    [facility] [place], [place], DO       Future Appointments             Today Debera Lat, PA-C [facility], [facility]

## 2023-04-21 LAB — URINALYSIS, MICROSCOPIC ONLY
Bacteria, UA: NONE SEEN
Casts: NONE SEEN /lpf

## 2023-04-21 LAB — SPECIMEN STATUS REPORT

## 2023-04-22 DIAGNOSIS — E119 Type 2 diabetes mellitus without complications: Secondary | ICD-10-CM | POA: Insufficient documentation

## 2023-04-22 DIAGNOSIS — R350 Frequency of micturition: Secondary | ICD-10-CM | POA: Insufficient documentation

## 2023-04-23 LAB — URINE CULTURE

## 2023-04-23 NOTE — Progress Notes (Signed)
Please, let pt know that her urine culture is pending but she needs to take an adequate amount of water daily , use cranberry juice and over-the counter AZO for 2 days before her urine culture will be back.

## 2023-04-23 NOTE — Progress Notes (Signed)
Please, let pt know that her ur culture back and is negative which indicative of "no UTI" advised to drink an adequate amount of water, cranberry juice. Will reassess at her next appt

## 2023-04-27 ENCOUNTER — Other Ambulatory Visit: Payer: Medicare PPO

## 2023-05-01 ENCOUNTER — Other Ambulatory Visit: Payer: Self-pay | Admitting: Physician Assistant

## 2023-05-01 DIAGNOSIS — Z1231 Encounter for screening mammogram for malignant neoplasm of breast: Secondary | ICD-10-CM

## 2023-05-02 ENCOUNTER — Telehealth: Payer: Self-pay | Admitting: Physician Assistant

## 2023-05-02 NOTE — Telephone Encounter (Signed)
[facility] [address]  Humalog Mix 75-25 Kwickpem Refill request

## 2023-05-03 ENCOUNTER — Other Ambulatory Visit: Payer: Self-pay

## 2023-05-03 ENCOUNTER — Other Ambulatory Visit: Payer: Self-pay | Admitting: Physician Assistant

## 2023-05-03 DIAGNOSIS — E119 Type 2 diabetes mellitus without complications: Secondary | ICD-10-CM

## 2023-05-03 MED ORDER — INSULIN LISPRO PROT & LISPRO (75-25 MIX) 100 UNIT/ML KWIKPEN
PEN_INJECTOR | SUBCUTANEOUS | 1 refills | Status: DC
Start: 2023-05-03 — End: 2023-05-07

## 2023-05-04 ENCOUNTER — Other Ambulatory Visit: Payer: Self-pay | Admitting: Unknown Physician Specialty

## 2023-05-04 NOTE — Telephone Encounter (Signed)
Requested medications are due for refill today.  no  Requested medications are on the active medications list.  no  Last refill. New medication  Future visit scheduled.   yes  Notes to clinic.  Pharmacy comment: Alternative Requested:THE PRESCRIBED MEDICATION IS NOT COVERED BY INSURANCE. PLEASE CONSIDER CHANGING TO ONE OF THE SUGGESTED COVERED ALTERNATIVES.    Requested Prescriptions  Pending Prescriptions Disp Refills   NOVOLOG MIX 70/30 (70-30) 100 UNIT/ML injection [Pharmacy Med Name: NOVOLOG MIX 70-30 VIAL]  0     Endocrinology:  Diabetes - Insulins Passed - 05/03/2023 12:32 PM      Passed - HBA1C is between 0 and 7.9 and within 180 days    HB A1C (BAYER DCA - WAIVED)  Date Value Ref Range Status  12/28/2022 7.4 (H) 4.8 - 5.6 % Final    Comment:             Prediabetes: 5.7 - 6.4          Diabetes: >6.4          Glycemic control for adults with diabetes: <7.0          Passed - Valid encounter within last 6 months    Recent Outpatient Visits           2 weeks ago Gastroesophageal reflux disease, unspecified whether esophagitis present   [place] [facility] [place], [place], PA-C   2 months ago Gastroesophageal reflux disease, unspecified whether esophagitis present   [place] [facility] [place], Megan P, DO   4 months ago Type 2 diabetes mellitus with hyperglycemia, with long-term current use of insulin (HCC)   [place] [facility], Megan P, DO   5 months ago Controlled type 2 diabetes mellitus with microalbuminuria, with long-term current use of insulin (HCC)    [facility] [place], Megan P, DO   6 months ago Essential hypertension    [facility] [place], Oralia Rud, DO       Future Appointments             In 2 weeks [place], Edmon Crape, PA-C [facility], [facility]

## 2023-05-04 NOTE — Telephone Encounter (Signed)
Requested medication (s) are due for refill today: yes  Requested medication (s) are on the active medication list: yes  Last refill:  08/04/22  Future visit scheduled: yes  Notes to clinic:  Unable to refill per protocol, last refill by historical provider.  Routing for approval.     Requested Prescriptions  Pending Prescriptions Disp Refills   pantoprazole (PROTONIX) 40 MG tablet [Pharmacy Med Name: PANTOPRAZOLE 40MG  TABLETS] 90 tablet     Sig: TAKE 1 TABLET(40 MG) BY MOUTH DAILY     Gastroenterology: Proton Pump Inhibitors Passed - 05/04/2023  8:00 AM      Passed - Valid encounter within last 12 months    Recent Outpatient Visits           2 weeks ago Gastroesophageal reflux disease, unspecified whether esophagitis present   [place] [facility] [place], [place], PA-C   2 months ago Gastroesophageal reflux disease, unspecified whether esophagitis present   [place] [facility] [place], Megan P, DO   4 months ago Type 2 diabetes mellitus with hyperglycemia, with long-term current use of insulin (HCC)   [place] [facility], Megan P, DO   5 months ago Controlled type 2 diabetes mellitus with microalbuminuria, with long-term current use of insulin [facility])   [place] [facility] [place], Megan P, DO   6 months ago Essential hypertension   [place] [facility] [place], Oralia Rud, DO       Future Appointments             In 2 weeks Ostwalt, Edmon Crape, PA-C [facility], [facility]

## 2023-05-07 ENCOUNTER — Other Ambulatory Visit: Payer: Self-pay | Admitting: Physician Assistant

## 2023-05-07 DIAGNOSIS — E119 Type 2 diabetes mellitus without complications: Secondary | ICD-10-CM

## 2023-05-07 MED ORDER — NOVOLOG MIX 70/30 FLEXPEN (70-30) 100 UNIT/ML ~~LOC~~ SUPN
PEN_INJECTOR | SUBCUTANEOUS | 11 refills | Status: AC
Start: 2023-05-07 — End: ?

## 2023-05-07 NOTE — Telephone Encounter (Signed)
Called to inform patient her medication was sent the pharmacy

## 2023-05-07 NOTE — Telephone Encounter (Signed)
Medication Refill - Medication: valsartan-hydrochlorothiazide (DIOVAN-HCT) 160-12.5 MG tablet,  She stated that she is a new patient of [facility] and needs this filled.   Has the patient contacted their pharmacy? No. No, more refills.   (Agent: If yes, when and what did the pharmacy advise?)  Preferred Pharmacy (with phone number or street name):  [id number] [place], [place] - [address]  [address] [place] [address]  Phone: [phone] Fax: [phone]  Hours: Not open 24 hours   Has the patient been seen for an appointment in the last year OR does the patient have an upcoming appointment? Yes.    Agent: Please be advised that RX refills may take up to 3 business days. We ask that you follow-up with your pharmacy.

## 2023-05-07 NOTE — Telephone Encounter (Signed)
Pt is calling to f/u on request for refill on Humalog. She stated that she is a new patient of Edmon Crape and needs this filled.  [id number] [place], [place] - [address]  [place] [address]  Phone: [phone] Fax: [phone]  Hours: Not open 24 hours   Please advise.

## 2023-05-08 NOTE — Telephone Encounter (Signed)
Requested medication (s) are due for refill today: Amount not specified  Requested medication (s) are on the active medication list: yes    Last refill: 02/16/23  Amount not specified  Future visit scheduled yes 05/18/23  Notes to clinic:Historical provider, please review. Thank you.  Requested Prescriptions  Pending Prescriptions Disp Refills   valsartan-hydrochlorothiazide (DIOVAN-HCT) 160-12.5 MG tablet      Sig: Take 1 tablet by mouth daily.     Cardiovascular: ARB + Diuretic Combos Failed - 05/07/2023 11:30 AM      Failed - Last BP in normal range    BP Readings from Last 1 Encounters:  04/20/23 (!) 157/61         Passed - K in normal range and within 180 days    Potassium  Date Value Ref Range Status  02/19/2023 4.0 3.5 - 5.1 mmol/L Final         Passed - Na in normal range and within 180 days    Sodium  Date Value Ref Range Status  02/19/2023 139 135 - 145 mmol/L Final  11/29/2022 142 134 - 144 mmol/L Final         Passed - Cr in normal range and within 180 days    Creatinine, Ser  Date Value Ref Range Status  02/19/2023 0.91 0.44 - 1.00 mg/dL Final         Passed - eGFR is 10 or above and within 180 days    GFR, Estimated  Date Value Ref Range Status  02/19/2023 >60 >60 mL/min Final    Comment:    (NOTE) Calculated using the CKD-EPI Creatinine Equation (2021)    eGFR  Date Value Ref Range Status  11/29/2022 66 >59 mL/min/1.73 Final         Passed - Patient is not pregnant      Passed - Valid encounter within last 6 months    Recent Outpatient Visits           2 weeks ago Gastroesophageal reflux disease, unspecified whether esophagitis present   [place] [place], [place], PA-C   2 months ago Gastroesophageal reflux disease, unspecified whether esophagitis present   [facility] [place], Megan P, DO   4 months ago Type 2 diabetes mellitus with hyperglycemia, with long-term current use of insulin  (HCC)   [place], Megan P, DO   5 months ago Controlled type 2 diabetes mellitus with microalbuminuria, with long-term current use of insulin (HCC)   [facility] [place], Megan P, DO   6 months ago Essential hypertension   [place] [place], Oralia Rud, DO       Future Appointments             In 1 week Debera Lat, PA-C [facility], [facility]

## 2023-05-09 ENCOUNTER — Ambulatory Visit
Admission: RE | Admit: 2023-05-09 | Discharge: 2023-05-09 | Disposition: A | Discharge status: Home / Self Care | Payer: Medicare PPO | Source: Ambulatory Visit | Attending: Physician Assistant | Admitting: Physician Assistant

## 2023-05-09 DIAGNOSIS — Z1231 Encounter for screening mammogram for malignant neoplasm of breast: Secondary | ICD-10-CM | POA: Diagnosis present

## 2023-05-11 ENCOUNTER — Telehealth: Payer: Self-pay

## 2023-05-11 ENCOUNTER — Other Ambulatory Visit: Payer: Self-pay | Admitting: Physician Assistant

## 2023-05-11 DIAGNOSIS — I10 Essential (primary) hypertension: Secondary | ICD-10-CM

## 2023-05-11 DIAGNOSIS — E119 Type 2 diabetes mellitus without complications: Secondary | ICD-10-CM

## 2023-05-11 MED ORDER — ACCU-CHEK GUIDE VI STRP: 1.0000 | ORAL_STRIP | Freq: Three times a day (TID) | 12 refills | Status: AC

## 2023-05-11 NOTE — Telephone Encounter (Signed)
Requested medication (s) are due for refill today:-  Requested medication (s) are on the active medication list:historical provider   Last refill:  lancets: 11/17/21     valsartan- hydrochlorothiazide: 02/16/23  Future visit scheduled: yes  Notes to clinic:  historical provider and med- duplicate request   Requested Prescriptions  Pending Prescriptions Disp Refills   Accu-Chek Softclix Lancets lancets 100 each     Sig: Use as instructed     Endocrinology: Diabetes - Testing Supplies Passed - 05/11/2023 10:54 AM      Passed - Valid encounter within last 12 months    Recent Outpatient Visits           3 weeks ago Gastroesophageal reflux disease, unspecified whether esophagitis present   [place] [facility] [place], [place], PA-C   2 months ago Gastroesophageal reflux disease, unspecified whether esophagitis present   [place] [facility] [place], Megan P, DO   4 months ago Type 2 diabetes mellitus with hyperglycemia, with long-term current use of insulin (HCC)   [place] [facility], Megan P, DO   5 months ago Controlled type 2 diabetes mellitus with microalbuminuria, with long-term current use of insulin (HCC)   [facility] [place], Megan P, DO   6 months ago Essential hypertension   [place] [place], [place], DO       Future Appointments             In 1 week Debera Lat, PA-C [facility]             valsartan-hydrochlorothiazide (DIOVAN-HCT) 160-12.5 MG tablet      Sig: Take 1 tablet by mouth daily.     Cardiovascular: ARB + Diuretic Combos Failed - 05/11/2023 10:54 AM      Failed - Last BP in normal range    BP Readings from Last 1 Encounters:  04/20/23 (!) 157/61         Passed - K in normal range and within 180 days    Potassium  Date Value Ref Range Status  02/19/2023 4.0 3.5 - 5.1 mmol/L Final         Passed -  Na in normal range and within 180 days    Sodium  Date Value Ref Range Status  02/19/2023 139 135 - 145 mmol/L Final  11/29/2022 142 134 - 144 mmol/L Final         Passed - Cr in normal range and within 180 days    Creatinine, Ser  Date Value Ref Range Status  02/19/2023 0.91 0.44 - 1.00 mg/dL Final         Passed - eGFR is 10 or above and within 180 days    GFR, Estimated  Date Value Ref Range Status  02/19/2023 >60 >60 mL/min Final    Comment:    (NOTE) Calculated using the CKD-EPI Creatinine Equation (2021)    eGFR  Date Value Ref Range Status  11/29/2022 66 >59 mL/min/1.73 Final         Passed - Patient is not pregnant      Passed - Valid encounter within last 6 months    Recent Outpatient Visits           3 weeks ago Gastroesophageal reflux disease, unspecified whether esophagitis present   [facility] [place], [place], PA-C   2 months ago Gastroesophageal reflux disease, unspecified whether esophagitis present   [facility] [place], [place] P, DO   4 months ago Type 2 diabetes mellitus with hyperglycemia, with long-term current use of insulin (HCC)   [facility] [place], [place] P, DO   5 months ago Controlled type 2 diabetes mellitus with microalbuminuria, with long-term current use of insulin [facility])   [facility] [place], Megan P, DO   6 months ago Essential hypertension   [place] [facility] [place], Oralia Rud, DO       Future Appointments             In 1 week Debera Lat, PA-C [place] [facility], [facility]            Signed Prescriptions Disp Refills   ACCU-CHEK GUIDE test strip 100 each 12    Sig: 1 each by Other route 3 (three) times daily.     Endocrinology: Diabetes - Testing Supplies Passed - 05/11/2023 10:54 AM      Passed - Valid encounter within last 12 months    Recent Outpatient Visits           3 weeks ago  Gastroesophageal reflux disease, unspecified whether esophagitis present   [place] [facility] [place], [place], PA-C   2 months ago Gastroesophageal reflux disease, unspecified whether esophagitis present   [place] [facility] [place], Megan P, DO   4 months ago Type 2 diabetes mellitus with hyperglycemia, with long-term current use of insulin (HCC)   [place], Megan P, DO   5 months ago Controlled type 2 diabetes mellitus with microalbuminuria, with long-term current use of insulin [facility])   [facility] [facility] [place], Megan P, DO   6 months ago Essential hypertension   [place] [facility] [place], Oralia Rud, DO       Future Appointments             In 1 week Debera Lat, PA-C [facility], [facility]

## 2023-05-11 NOTE — Telephone Encounter (Signed)
Copied from CRM [id number]. Topic: General - Other >> May 11, 2023  8:35 AM Everette C wrote: Reason for CRM: Medication Refill - Medication:    BD alcohol prep pads   Accu-Chek guide control solution    Has the patient contacted their pharmacy? Yes.   (Agent: If no, request that the patient contact the pharmacy for the refill. If patient does not wish to contact the pharmacy document the reason why and proceed with request.) (Agent: If yes, when and what did the pharmacy advise?)   Preferred Pharmacy (with phone number or street name): [facility] - [place], [place] - [address] [place] [address] Phone: [phone] Fax: [phone] Hours: Not open 24 hours   Has the patient been seen for an appointment in the last year OR does the patient have an upcoming appointment? Yes.     Agent: Please be advised that RX refills may take up to 3 business days. We ask that you follow-up with your pharmacy.

## 2023-05-11 NOTE — Telephone Encounter (Signed)
Requested Prescriptions  Pending Prescriptions Disp Refills   ACCU-CHEK GUIDE test strip 100 each 12    Sig: 1 each by Other route 3 (three) times daily.     Endocrinology: Diabetes - Testing Supplies Passed - 05/11/2023 10:54 AM      Passed - Valid encounter within last 12 months    Recent Outpatient Visits           3 weeks ago Gastroesophageal reflux disease, unspecified whether esophagitis present   [place] [facility] [place], [place], PA-C   2 months ago Gastroesophageal reflux disease, unspecified whether esophagitis present   [facility] [place], Megan P, DO   4 months ago Type 2 diabetes mellitus with hyperglycemia, with long-term current use of insulin (HCC)   [place] [facility], Megan P, DO   5 months ago Controlled type 2 diabetes mellitus with microalbuminuria, with long-term current use of insulin (HCC)   [place] [facility] [place], Megan P, DO   6 months ago Essential hypertension   [place] [facility] [place], [place], DO       Future Appointments             In 1 week Debera Lat, PA-C [facility]             Accu-Chek Softclix Lancets lancets 100 each     Sig: Use as instructed     Endocrinology: Diabetes - Testing Supplies Passed - 05/11/2023 10:54 AM      Passed - Valid encounter within last 12 months    Recent Outpatient Visits           3 weeks ago Gastroesophageal reflux disease, unspecified whether esophagitis present   [facility] [place], [place], PA-C   2 months ago Gastroesophageal reflux disease, unspecified whether esophagitis present   [facility] [place], Megan P, DO   4 months ago Type 2 diabetes mellitus with hyperglycemia, with long-term current use of insulin (HCC)   [facility], Megan P, DO   5 months ago Controlled  type 2 diabetes mellitus with microalbuminuria, with long-term current use of insulin (HCC)   [place] [facility] [place], Megan P, DO   6 months ago Essential hypertension   [place] [facility] [place] [place], [place], DO       Future Appointments             In 1 week Debera Lat, PA-C [place] [facility]             valsartan-hydrochlorothiazide (DIOVAN-HCT) 160-12.5 MG tablet      Sig: Take 1 tablet by mouth daily.     Cardiovascular: ARB + Diuretic Combos Failed - 05/11/2023 10:54 AM      Failed - Last BP in normal range    BP Readings from Last 1 Encounters:  04/20/23 (!) 157/61         Passed - K in normal range and within 180 days    Potassium  Date Value Ref Range Status  02/19/2023 4.0 3.5 - 5.1 mmol/L Final         Passed - Na in normal range and within 180 days    Sodium  Date Value Ref Range Status  02/19/2023 139 135 - 145 mmol/L Final  11/29/2022 142 134 - 144 mmol/L Final  Passed - Cr in normal range and within 180 days    Creatinine, Ser  Date Value Ref Range Status  02/19/2023 0.91 0.44 - 1.00 mg/dL Final         Passed - eGFR is 10 or above and within 180 days    GFR, Estimated  Date Value Ref Range Status  02/19/2023 >60 >60 mL/min Final    Comment:    (NOTE) Calculated using the CKD-EPI Creatinine Equation (2021)    eGFR  Date Value Ref Range Status  11/29/2022 66 >59 mL/min/1.73 Final         Passed - Patient is not pregnant      Passed - Valid encounter within last 6 months    Recent Outpatient Visits           3 weeks ago Gastroesophageal reflux disease, unspecified whether esophagitis present   [facility] [place], [place], PA-C   2 months ago Gastroesophageal reflux disease, unspecified whether esophagitis present   [place] [place], Megan P, DO   4 months ago Type 2 diabetes mellitus with hyperglycemia, with  long-term current use of insulin (HCC)   [place] [facility] [place], Megan P, DO   5 months ago Controlled type 2 diabetes mellitus with microalbuminuria, with long-term current use of insulin (HCC)   [facility] [place], Megan P, DO   6 months ago Essential hypertension   [place] [facility] [place], Oralia Rud, DO       Future Appointments             In 1 week Debera Lat, PA-C [facility], [facility]

## 2023-05-11 NOTE — Telephone Encounter (Signed)
Valsartan was sent to the office on 05/09/23 in the 05/07/23 refill encounter. BD Alcohol prep pads and accu-check guide control solution sent to office in a separate encounter today.

## 2023-05-11 NOTE — Telephone Encounter (Unsigned)
Copied from CRM [id number]. Topic: General - Other >> May 11, 2023  8:35 AM Everette C wrote: Reason for CRM: Medication Refill - Medication: ACCU-CHEK GUIDE test strip [[id number]]  Accu-Chek Softclix Lancets lancets [[id number]]  valsartan-hydrochlorothiazide (DIOVAN-HCT) 160-12.5 MG tablet [[id number]]  BD alcohol prep pads  Accu-Chek guide control solution   Has the patient contacted their pharmacy? Yes.   (Agent: If no, request that the patient contact the pharmacy for the refill. If patient does not wish to contact the pharmacy document the reason why and proceed with request.) (Agent: If yes, when and what did the pharmacy advise?)  Preferred Pharmacy (with phone number or street name): [facility] - [place], [place] - [address] [place] [address] Phone: [phone] Fax: [phone] Hours: Not open 24 hours  Has the patient been seen for an appointment in the last year OR does the patient have an upcoming appointment? Yes.    Agent: Please be advised that RX refills may take up to 3 business days. We ask that you follow-up with your pharmacy.

## 2023-05-14 MED ORDER — VALSARTAN-HYDROCHLOROTHIAZIDE 160-12.5 MG PO TABS
1.0000 | ORAL_TABLET | Freq: Every day | ORAL | 1 refills | Status: DC
Start: 2023-05-14 — End: 2023-11-07

## 2023-05-14 MED ORDER — ACCU-CHEK SOFTCLIX LANCETS MISC
1 refills | Status: AC
Start: 2023-05-14 — End: ?

## 2023-05-15 ENCOUNTER — Other Ambulatory Visit: Payer: Self-pay | Admitting: Physician Assistant

## 2023-05-15 DIAGNOSIS — E119 Type 2 diabetes mellitus without complications: Secondary | ICD-10-CM

## 2023-05-17 ENCOUNTER — Other Ambulatory Visit: Payer: Self-pay | Admitting: *Deleted

## 2023-05-17 ENCOUNTER — Inpatient Hospital Stay
Admission: RE | Admit: 2023-05-17 | Discharge: 2023-05-17 | Disposition: A | Discharge status: Home / Self Care | Payer: Self-pay | Source: Ambulatory Visit | Attending: Physician Assistant | Admitting: Physician Assistant

## 2023-05-17 DIAGNOSIS — Z1231 Encounter for screening mammogram for malignant neoplasm of breast: Secondary | ICD-10-CM

## 2023-05-18 ENCOUNTER — Other Ambulatory Visit: Payer: Self-pay | Admitting: Physician Assistant

## 2023-05-18 ENCOUNTER — Ambulatory Visit (INDEPENDENT_AMBULATORY_CARE_PROVIDER_SITE_OTHER): Payer: Medicare PPO | Admitting: Family Medicine

## 2023-05-18 ENCOUNTER — Encounter: Payer: Self-pay | Admitting: Family Medicine

## 2023-05-18 VITALS — BP 148/69 | HR 80 | Ht 66.0 in | Wt 211.2 lb

## 2023-05-18 DIAGNOSIS — F02A0 Dementia in other diseases classified elsewhere, mild, without behavioral disturbance, psychotic disturbance, mood disturbance, and anxiety: Secondary | ICD-10-CM

## 2023-05-18 DIAGNOSIS — N63 Unspecified lump in unspecified breast: Secondary | ICD-10-CM

## 2023-05-18 DIAGNOSIS — N6489 Other specified disorders of breast: Secondary | ICD-10-CM

## 2023-05-18 DIAGNOSIS — F0390 Unspecified dementia without behavioral disturbance: Secondary | ICD-10-CM | POA: Insufficient documentation

## 2023-05-18 DIAGNOSIS — E1159 Type 2 diabetes mellitus with other circulatory complications: Secondary | ICD-10-CM

## 2023-05-18 DIAGNOSIS — R519 Headache, unspecified: Secondary | ICD-10-CM

## 2023-05-18 DIAGNOSIS — I152 Hypertension secondary to endocrine disorders: Secondary | ICD-10-CM | POA: Diagnosis not present

## 2023-05-18 DIAGNOSIS — R928 Other abnormal and inconclusive findings on diagnostic imaging of breast: Secondary | ICD-10-CM

## 2023-05-18 MED ORDER — ROSUVASTATIN CALCIUM 40 MG PO TABS: 40.0000 mg | ORAL_TABLET | Freq: Every day | ORAL | 3 refills | Status: DC

## 2023-05-18 MED ORDER — CLONIDINE HCL 0.2 MG PO TABS: 0.2000 mg | ORAL_TABLET | Freq: Two times a day (BID) | ORAL | 3 refills | Status: DC

## 2023-05-18 NOTE — Assessment & Plan Note (Signed)
Chronic; reports mild Declines use of medication to assist Educated on use of MIND or Mediterranean diet to assist

## 2023-05-18 NOTE — Progress Notes (Signed)
Established patient visit  Patient: Vanessa Hogan   DOB: Mar 20, 1941   82 y.o. Female  MRN: [id number] Visit Date: 05/18/2023  Today's healthcare provider: Jacky Kindle, FNP  Introduced to nurse practitioner role and practice setting.  All questions answered.  Discussed provider/patient relationship and expectations.  Chief Complaint  Patient presents with   Medical Management of Chronic Issues    Patient reports she is present for htn f/u. She reports she is taking medications as prescribed and has some home readings available to review. Patient reports chest pressure, palpitations, difficulty breathing, sob, and lower leg edema   Subjective    HPI HPI     Medical Management of Chronic Issues    Additional comments: Patient reports she is present for htn f/u. She reports she is taking medications as prescribed and has some home readings available to review. Patient reports chest pressure, palpitations, difficulty breathing, sob, and lower leg edema      Last edited by Acey Lav, CMA on 05/18/2023 10:52 AM.      Medications: Outpatient Medications Prior to Visit  Medication Sig   ACCU-CHEK GUIDE test strip 1 each by Other route 3 (three) times daily.   Accu-Chek Softclix Lancets lancets Use as instructed   insulin aspart protamine - aspart (NOVOLOG MIX 70/30 FLEXPEN) (70-30) 100 UNIT/ML FlexPen INJECT 18 UNITS SUBCUTANEOUSLY EVERY MORNING AND 20 UNITS EVERY EVENING; if having any lows, can also consider reducing by up to 20% and then monitor to see if needs to adjust back up   Insulin Pen Needle (BD PEN NEEDLE NANO 2ND GEN) 32G X 4 MM MISC 1 each by Does not apply route in the morning, at noon, and at bedtime.   Insulin Pen Needle 32G X 4 MM MISC Use to inject insulin twice daily   pantoprazole (PROTONIX) 40 MG tablet TAKE 1 TABLET(40 MG) BY MOUTH DAILY   Semaglutide,0.25 or 0.5MG /DOS, (OZEMPIC, 0.25 OR 0.5 MG/DOSE,) 2 MG/3ML SOPN 0.25mg  weekly for 1 month, then  increase to 0.5mg  weekly   valsartan-hydrochlorothiazide (DIOVAN-HCT) 160-12.5 MG tablet Take 1 tablet by mouth daily.   [DISCONTINUED] allopurinol (ZYLOPRIM) 100 MG tablet TAKE 1 TABLET(100 MG) BY MOUTH DAILY   [DISCONTINUED] celecoxib (CELEBREX) 100 MG capsule Take 1 capsule (100 mg total) by mouth daily.   [DISCONTINUED] cloNIDine (CATAPRES) 0.2 MG tablet Take 1 tablet (0.2 mg total) by mouth daily.   [DISCONTINUED] fluticasone (FLONASE) 50 MCG/ACT nasal spray Place into both nostrils.   [DISCONTINUED] lidocaine (XYLOCAINE) 2 % solution Use as directed 15 mLs in the mouth or throat as needed (chest pain).   [DISCONTINUED] loratadine (CLARITIN) 10 MG tablet Take 10 mg by mouth daily as needed.   [DISCONTINUED] LORazepam (ATIVAN) 0.5 MG tablet Take 1 tablet (0.5 mg total) by mouth as needed for anxiety.   [DISCONTINUED] metFORMIN (GLUCOPHAGE-XR) 500 MG 24 hr tablet Take 500 mg by mouth daily as needed.   [DISCONTINUED] rosuvastatin (CRESTOR) 10 MG tablet Take 1 tablet (10 mg total) by mouth daily.   [DISCONTINUED] sucralfate (CARAFATE) 1 g tablet Take 1 tablet (1 g total) by mouth 4 (four) times daily -  with meals and at bedtime.   [DISCONTINUED] sucralfate (CARAFATE) 1 GM/10ML suspension Take 10 mLs (1 g total) by mouth 4 (four) times daily.   [DISCONTINUED] triamcinolone ointment (KENALOG) 0.5 % Apply 1 Application topically 2 (two) times daily.   No facility-administered medications prior to visit.     Objective    BP (!) 148/69 (  BP Location: Right Arm, Patient Position: Sitting, Cuff Size: Large)   Pulse 80   Ht 5\' 6"  (1.676 m)   Wt 211 lb 3.2 oz (95.8 kg)   BMI 34.09 kg/m   Physical Exam Vitals and nursing note reviewed.  Constitutional:      General: She is not in acute distress.    Appearance: Normal appearance. She is obese. She is not ill-appearing, toxic-appearing or diaphoretic.  HENT:     Head: Normocephalic and atraumatic.  Cardiovascular:     Rate and Rhythm: Normal  rate and regular rhythm.     Pulses: Normal pulses.     Heart sounds: Normal heart sounds. No murmur heard.    No friction rub. No gallop.  Pulmonary:     Effort: Pulmonary effort is normal. No respiratory distress.     Breath sounds: Normal breath sounds. No stridor. No wheezing, rhonchi or rales.  Chest:     Chest wall: No tenderness.  Musculoskeletal:        General: No swelling, tenderness, deformity or signs of injury. Normal range of motion.     Right lower leg: No edema.     Left lower leg: No edema.  Skin:    General: Skin is warm and dry.     Capillary Refill: Capillary refill takes less than 2 seconds.     Coloration: Skin is not jaundiced or pale.     Findings: No bruising, erythema, lesion or rash.  Neurological:     General: No focal deficit present.     Mental Status: She is alert and oriented to person, place, and time. Mental status is at baseline.     Cranial Nerves: No cranial nerve deficit.     Sensory: No sensory deficit.     Motor: No weakness.     Coordination: Coordination normal.  Psychiatric:        Mood and Affect: Mood normal.        Behavior: Behavior normal.        Thought Content: Thought content normal.        Judgment: Judgment normal.     No results found for any visits on 05/18/23.  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    Chronic, remains elevated Discussed goal 129/79 for stroke and heart attack prevention given known chronic disease Continue to work on low sodium diet Pt reports lots of processed foods, sweets, processed meats (ham and hot dogs etc) Refill clonidine BID per pt report Continue diovan hct 160-12.5      Relevant Medications   cloNIDine (CATAPRES) 0.2 MG tablet   rosuvastatin (CRESTOR) 40 MG tablet     Nervous and Auditory   Dementia (HCC)    Chronic; reports mild Declines use of medication to  assist Educated on use of MIND or Mediterranean diet to assist         Other   Frequent headaches    Previous work up by neuro in Spring 2023 Declines further evaluation Reports good PO intake; reports BP and BG are checked during and are not in either extreme Continue to monitor; current does not even take tylenol to assist Defer NSAIDs with known HLD      Return if symptoms worsen or fail to improve.     Leilani Merl, FNP, have reviewed all documentation for this visit.  The documentation on 05/18/23 for the exam, diagnosis, procedures, and orders are all accurate and complete.  Jacky Kindle, FNP  [facility] [phone] (phone) [phone] (fax)  [facility]

## 2023-05-18 NOTE — Assessment & Plan Note (Signed)
Previous work up by neuro in Spring 2023 Declines further evaluation Reports good PO intake; reports BP and BG are checked during and are not in either extreme Continue to monitor; current does not even take tylenol to assist Defer NSAIDs with known HLD

## 2023-05-18 NOTE — Assessment & Plan Note (Signed)
Chronic, remains elevated Discussed goal 129/79 for stroke and heart attack prevention given known chronic disease Continue to work on low sodium diet Pt reports lots of processed foods, sweets, processed meats (ham and hot dogs etc) Refill clonidine BID per pt report Continue diovan hct 160-12.5

## 2023-06-01 ENCOUNTER — Other Ambulatory Visit: Payer: Self-pay | Admitting: Family Medicine

## 2023-06-01 NOTE — Telephone Encounter (Signed)
Unable to refill per protocol, Rx expired. Discontinued 05/18/23.  Requested Prescriptions  Pending Prescriptions Disp Refills   celecoxib (CELEBREX) 100 MG capsule [Pharmacy Med Name: CELECOXIB 100MG  CAPSULES] 90 capsule 1    Sig: TAKE 1 CAPSULE(100 MG) BY MOUTH DAILY     Analgesics:  COX2 Inhibitors Failed - 06/01/2023  3:32 AM      Failed - Manual Review: Labs are only required if the patient has taken medication for more than 8 weeks.      Failed - HCT in normal range and within 360 days    HCT  Date Value Ref Range Status  02/19/2023 46.6 (H) 36.0 - 46.0 % Final   Hematocrit  Date Value Ref Range Status  10/18/2022 45.4 34.0 - 46.6 % Final         Passed - HGB in normal range and within 360 days    Hemoglobin  Date Value Ref Range Status  02/19/2023 14.7 12.0 - 15.0 g/dL Final  78/29/5621 30.8 11.1 - 15.9 g/dL Final         Passed - Cr in normal range and within 360 days    Creatinine, Ser  Date Value Ref Range Status  02/19/2023 0.91 0.44 - 1.00 mg/dL Final         Passed - AST in normal range and within 360 days    AST  Date Value Ref Range Status  10/18/2022 20 0 - 40 IU/L Final         Passed - ALT in normal range and within 360 days    ALT  Date Value Ref Range Status  10/18/2022 14 0 - 32 IU/L Final         Passed - eGFR is 30 or above and within 360 days    GFR, Estimated  Date Value Ref Range Status  02/19/2023 >60 >60 mL/min Final    Comment:    (NOTE) Calculated using the CKD-EPI Creatinine Equation (2021)    eGFR  Date Value Ref Range Status  11/29/2022 66 >59 mL/min/1.73 Final         Passed - Patient is not pregnant      Passed - Valid encounter within last 12 months    Recent Outpatient Visits           2 weeks ago Hypertension associated with diabetes [facility])   [place] [facility] Merita Norton T, FNP   1 month ago Gastroesophageal reflux disease, unspecified whether esophagitis present   [place] [facility] [place], [place], PA-C   3 months ago Gastroesophageal reflux disease, unspecified whether esophagitis present   [place] [facility] [place], Megan P, DO   5 months ago Type 2 diabetes mellitus with hyperglycemia, with long-term current use of insulin (HCC)   [place] [facility] [place], Megan P, DO   6 months ago Controlled type 2 diabetes mellitus with microalbuminuria, with long-term current use of insulin (HCC)    [facility] [place], Oralia Rud, DO       Future Appointments             In 1 month Ostwalt, Edmon Crape, PA-C [facility], [facility]

## 2023-06-02 ENCOUNTER — Other Ambulatory Visit: Payer: Self-pay | Admitting: Family Medicine

## 2023-06-04 NOTE — Telephone Encounter (Signed)
D/C 02/16/23. Requested Prescriptions  Refused Prescriptions Disp Refills   valsartan-hydrochlorothiazide (DIOVAN-HCT) 320-25 MG tablet [Pharmacy Med Name: VALSARTAN/HCTZ 320MG /25MG  TABLETS] 90 tablet 1    Sig: TAKE 1 TABLET BY MOUTH DAILY     Cardiovascular: ARB + Diuretic Combos Failed - 06/02/2023  9:23 AM      Failed - Last BP in normal range    BP Readings from Last 1 Encounters:  05/18/23 (!) 148/69         Passed - K in normal range and within 180 days    Potassium  Date Value Ref Range Status  02/19/2023 4.0 3.5 - 5.1 mmol/L Final         Passed - Na in normal range and within 180 days    Sodium  Date Value Ref Range Status  02/19/2023 139 135 - 145 mmol/L Final  11/29/2022 142 134 - 144 mmol/L Final         Passed - Cr in normal range and within 180 days    Creatinine, Ser  Date Value Ref Range Status  02/19/2023 0.91 0.44 - 1.00 mg/dL Final         Passed - eGFR is 10 or above and within 180 days    GFR, Estimated  Date Value Ref Range Status  02/19/2023 >60 >60 mL/min Final    Comment:    (NOTE) Calculated using the CKD-EPI Creatinine Equation (2021)    eGFR  Date Value Ref Range Status  11/29/2022 66 >59 mL/min/1.73 Final         Passed - Patient is not pregnant      Passed - Valid encounter within last 6 months    Recent Outpatient Visits           2 weeks ago Hypertension associated with diabetes [facility])   [place] [facility] Merita Norton T, FNP   1 month ago Gastroesophageal reflux disease, unspecified whether esophagitis present   [place] [facility] [place], [place], PA-C   3 months ago Gastroesophageal reflux disease, unspecified whether esophagitis present   [place] [facility] [place], Megan P, DO   5 months ago Type 2 diabetes mellitus with hyperglycemia, with long-term current use of insulin (HCC)   [place] [facility] [place], Megan P, DO   6 months ago Controlled type  2 diabetes mellitus with microalbuminuria, with long-term current use of insulin (HCC)   [facility] [place], Oralia Rud, DO       Future Appointments             In 1 month Ostwalt, Edmon Crape, PA-C [facility], [facility]

## 2023-06-11 ENCOUNTER — Telehealth: Payer: Self-pay

## 2023-06-11 NOTE — Telephone Encounter (Signed)
Copied from CRM [id number]. Topic: General - Inquiry >> Jun 11, 2023  1:03 PM De Blanch wrote: Reason for CRM: Pt stated she spoke with [facility] a few days ago and was advised she may be eligible for insurance reductions. However, PCP needs to submit information about her current health issues, as this needs to be verified by the doctor.  Stated catastrophic problems that help her qualify for reductions, such as her diabetes, high blood pressure, diverticulitis anxiety and associated with diverticulitis fatigue, and suffering from headaches and dizziness.  On insurance records, they have Dr. Sherrie Mustache, and this needs to come from him, not PA per insurance.  Please advise.

## 2023-06-11 NOTE — Telephone Encounter (Signed)
Pt called to report that they are saying they need verification of her medical diagnosis. She says that she does not feel she should be responsible for contacting her insurance. She wants the clinic to call her insurance and confirm how they want to receive her med info.

## 2023-06-11 NOTE — Telephone Encounter (Signed)
Patient voicemail box not setup. Unable to leave voicemail. CRM created. Ok for [facility] to verify how information is to be submitted or if patient would like to pick up information once completed?

## 2023-06-11 NOTE — Telephone Encounter (Signed)
I have no idea how they want me to submit that information.

## 2023-06-12 ENCOUNTER — Telehealth: Payer: Self-pay

## 2023-06-12 NOTE — Telephone Encounter (Signed)
2 boxes of Ozempic received for the patient. Tried calling to let her know it was ready to be picked up, no answer and no VM. Will try to call again later.

## 2023-06-12 NOTE — Telephone Encounter (Signed)
Pt has been made aware, she plans to retrieve them today.

## 2023-06-12 NOTE — Telephone Encounter (Signed)
Pt called in says we need to call [employer] to request form to be  sent to [place] to be filled her about her health condition for highr lever of benefits and then send from back to them.

## 2023-06-13 NOTE — Telephone Encounter (Signed)
Medication picked up by the patient.  

## 2023-06-24 NOTE — Progress Notes (Unsigned)
Established patient visit  Patient: Vanessa Hogan   DOB: May 03, 1941   82 y.o. Female  MRN: [id number] Visit Date: 06/26/2023  Today's healthcare provider: Debera Lat, PA-C   No chief complaint on file.  Subjective    HPI  *** Discussed the use of AI scribe software for clinical note transcription with the patient, who gave verbal consent to proceed.  History of Present Illness               04/20/2023    3:00 PM 02/27/2023   10:59 AM 01/02/2023   10:46 AM  Depression screen PHQ 2/9  Decreased Interest 0 1 0  Down, Depressed, Hopeless 0 1 0  PHQ - 2 Score 0 2 0  Altered sleeping 1 1   Tired, decreased energy 1 3   Change in appetite 0 1   Feeling bad or failure about yourself  0 1   Trouble concentrating 0 0   Moving slowly or fidgety/restless 0 1   Suicidal thoughts 0 0   PHQ-9 Score 2 9   Difficult doing work/chores Not difficult at all Very difficult       04/20/2023    3:00 PM 02/27/2023   11:00 AM 12/28/2022   11:57 AM 11/29/2022   10:12 AM  GAD 7 : Generalized Anxiety Score  Nervous, Anxious, on Edge 1 1 1 2   Control/stop worrying 1 0 1 2  Worry too much - different things 1 1 0 2  Trouble relaxing 0 1 0 2  Restless 0 0 0 2  Easily annoyed or irritable 1 0 0 1  Afraid - awful might happen 0 0 0 1  Total GAD 7 Score 4 3 2 12   Anxiety Difficulty Not difficult at all Somewhat difficult Not difficult at all Somewhat difficult    Medications: Outpatient Medications Prior to Visit  Medication Sig   ACCU-CHEK GUIDE test strip 1 each by Other route 3 (three) times daily.   Accu-Chek Softclix Lancets lancets Use as instructed   cloNIDine (CATAPRES) 0.2 MG tablet Take 1 tablet (0.2 mg total) by mouth 2 (two) times daily.   insulin aspart protamine - aspart (NOVOLOG MIX 70/30 FLEXPEN) (70-30) 100 UNIT/ML FlexPen INJECT 18 UNITS SUBCUTANEOUSLY EVERY MORNING AND 20 UNITS EVERY EVENING; if having any lows, can also consider reducing by up to 20% and then  monitor to see if needs to adjust back up   Insulin Pen Needle (BD PEN NEEDLE NANO 2ND GEN) 32G X 4 MM MISC 1 each by Does not apply route in the morning, at noon, and at bedtime.   Insulin Pen Needle 32G X 4 MM MISC Use to inject insulin twice daily   pantoprazole (PROTONIX) 40 MG tablet TAKE 1 TABLET(40 MG) BY MOUTH DAILY   rosuvastatin (CRESTOR) 40 MG tablet Take 1 tablet (40 mg total) by mouth daily. Increased from 10 mg. Take only 40 mg daily. Cholesterol/Stroke/heart attack prevention.   Semaglutide,0.25 or 0.5MG /DOS, (OZEMPIC, 0.25 OR 0.5 MG/DOSE,) 2 MG/3ML SOPN 0.25mg  weekly for 1 month, then increase to 0.5mg  weekly   valsartan-hydrochlorothiazide (DIOVAN-HCT) 160-12.5 MG tablet Take 1 tablet by mouth daily.   No facility-administered medications prior to visit.    Review of Systems  All other systems reviewed and are negative.  Except see HPI   {Insert previous labs (optional):23779} {See past labs  Heme  Chem  Endocrine  Serology  Results Review (optional):1}   Objective    There were no vitals taken for this  visit. {Insert last BP/Wt (optional):23777}{See vitals history (optional):1}   Physical Exam Vitals reviewed.  Constitutional:      General: She is not in acute distress.    Appearance: Normal appearance. She is well-developed. She is not diaphoretic.  HENT:     Head: Normocephalic and atraumatic.  Eyes:     General: No scleral icterus.    Conjunctiva/sclera: Conjunctivae normal.  Neck:     Thyroid: No thyromegaly.  Cardiovascular:     Rate and Rhythm: Normal rate and regular rhythm.     Pulses: Normal pulses.     Heart sounds: Normal heart sounds. No murmur heard. Pulmonary:     Effort: Pulmonary effort is normal. No respiratory distress.     Breath sounds: Normal breath sounds. No wheezing, rhonchi or rales.  Musculoskeletal:     Cervical back: Neck supple.     Right lower leg: No edema.     Left lower leg: No edema.  Lymphadenopathy:      Cervical: No cervical adenopathy.  Skin:    General: Skin is warm and dry.     Findings: No rash.  Neurological:     Mental Status: She is alert and oriented to person, place, and time. Mental status is at baseline.  Psychiatric:        Mood and Affect: Mood normal.        Behavior: Behavior normal.      No results found for any visits on 06/26/23.  Assessment & Plan                  No follow-ups on file.     The patient was advised to call back or seek an in-person evaluation if the symptoms worsen or if the condition fails to improve as anticipated.  I discussed the assessment and treatment plan with the patient. The patient was provided an opportunity to ask questions and all were answered. The patient agreed with the plan and demonstrated an understanding of the instructions.  I, Debera Lat, PA-C have reviewed all documentation for this visit. The documentation on  06/26/23  for the exam, diagnosis, procedures, and orders are all accurate and complete.  Debera Lat, [facility], MMS [facility] [phone] (phone) [phone] (fax)  [facility]

## 2023-06-26 ENCOUNTER — Encounter: Payer: Self-pay | Admitting: Physician Assistant

## 2023-06-26 ENCOUNTER — Ambulatory Visit (INDEPENDENT_AMBULATORY_CARE_PROVIDER_SITE_OTHER): Payer: Medicare PPO | Admitting: Physician Assistant

## 2023-06-26 VITALS — BP 149/83 | HR 80 | Ht 66.0 in | Wt 206.9 lb

## 2023-06-26 DIAGNOSIS — E1169 Type 2 diabetes mellitus with other specified complication: Secondary | ICD-10-CM | POA: Diagnosis not present

## 2023-06-26 DIAGNOSIS — E119 Type 2 diabetes mellitus without complications: Secondary | ICD-10-CM

## 2023-06-26 DIAGNOSIS — E785 Hyperlipidemia, unspecified: Secondary | ICD-10-CM | POA: Diagnosis not present

## 2023-06-26 DIAGNOSIS — I152 Hypertension secondary to endocrine disorders: Secondary | ICD-10-CM | POA: Diagnosis not present

## 2023-06-26 DIAGNOSIS — Z7985 Long-term (current) use of injectable non-insulin antidiabetic drugs: Secondary | ICD-10-CM | POA: Diagnosis not present

## 2023-06-26 DIAGNOSIS — R051 Acute cough: Secondary | ICD-10-CM

## 2023-06-26 DIAGNOSIS — E1165 Type 2 diabetes mellitus with hyperglycemia: Secondary | ICD-10-CM

## 2023-06-26 DIAGNOSIS — K219 Gastro-esophageal reflux disease without esophagitis: Secondary | ICD-10-CM

## 2023-06-26 DIAGNOSIS — M7989 Other specified soft tissue disorders: Secondary | ICD-10-CM

## 2023-06-26 DIAGNOSIS — E1159 Type 2 diabetes mellitus with other circulatory complications: Secondary | ICD-10-CM | POA: Diagnosis not present

## 2023-06-26 LAB — POC COVID19 BINAXNOW: SARS Coronavirus 2 Ag: NEGATIVE

## 2023-06-26 MED ORDER — COVID-19 AT-HOME TEST VI KIT
PACK | 1 refills | Status: AC
Start: 2023-06-26 — End: ?

## 2023-07-05 IMAGING — US US PELVIS COMPLETE WITH TRANSVAGINAL
1 series · 13 of 25 positions shown · non-contrast
Comparison: None

CLINICAL DATA: Lower abdominal pain for 5 weeks, postmenopausal



[Series 1: us pelvic complete with transvaginal · 13 of 74 slices shown]
[im 1/74]
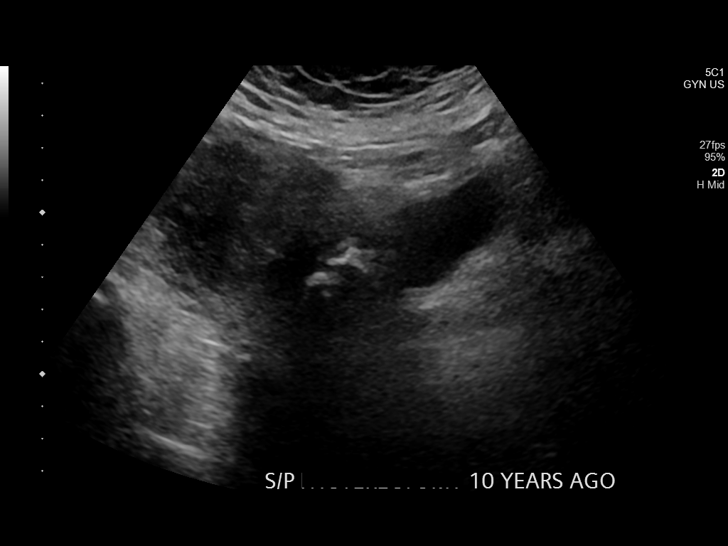
[im 7/74]
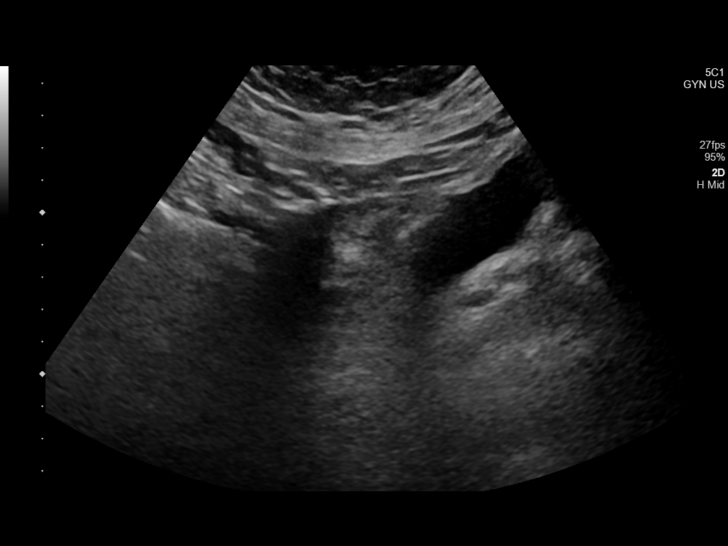
[im 13/74]
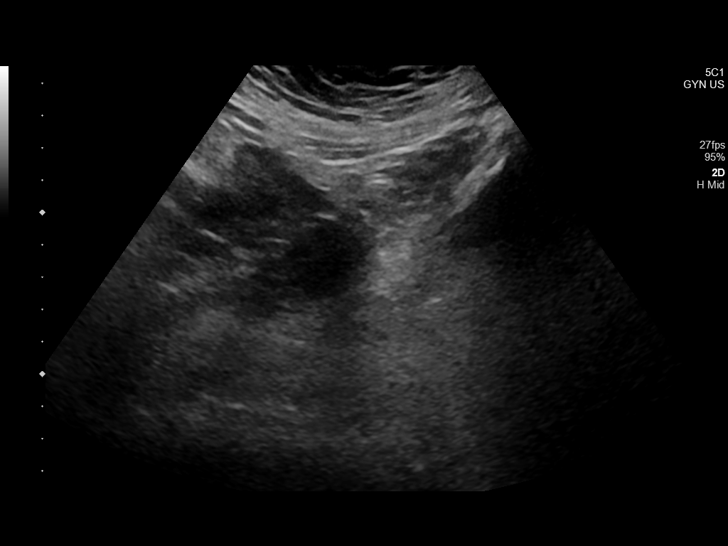
[im 19/74]
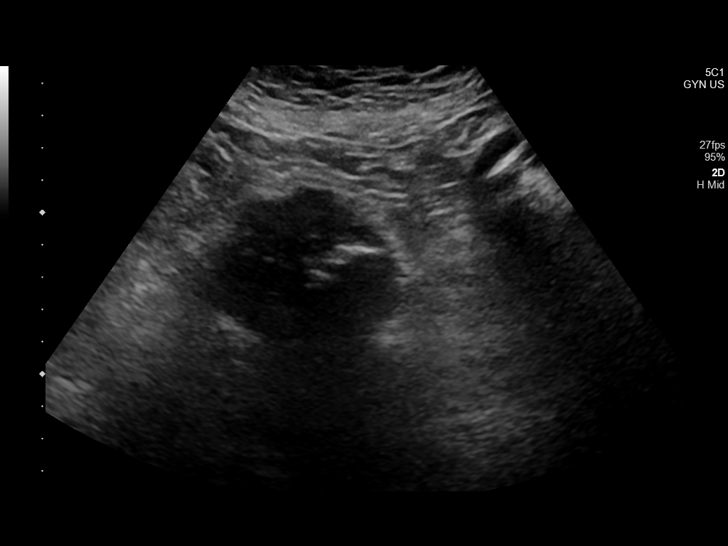
[im 25/74]
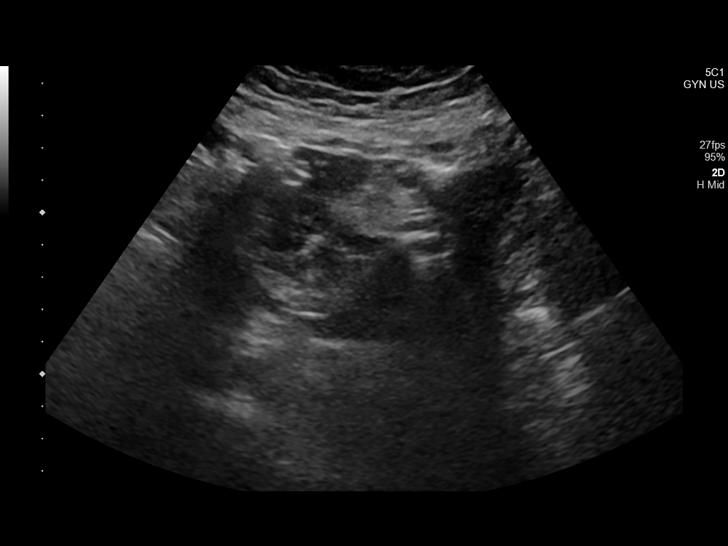
[im 31/74]
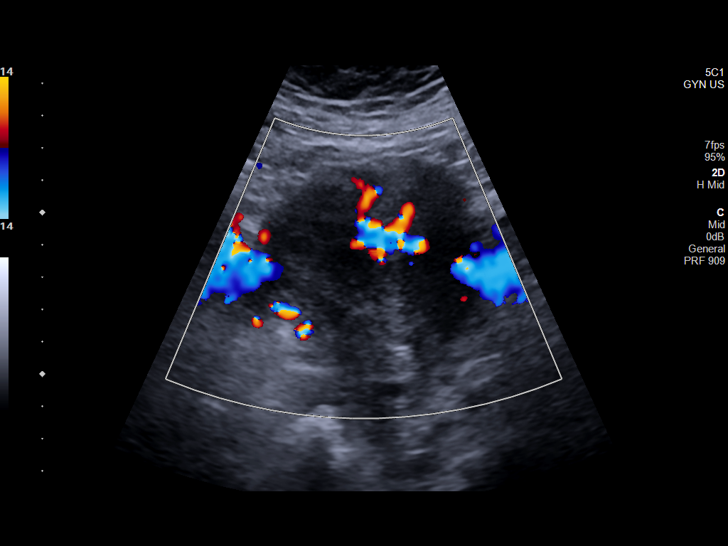
[im 37/74]
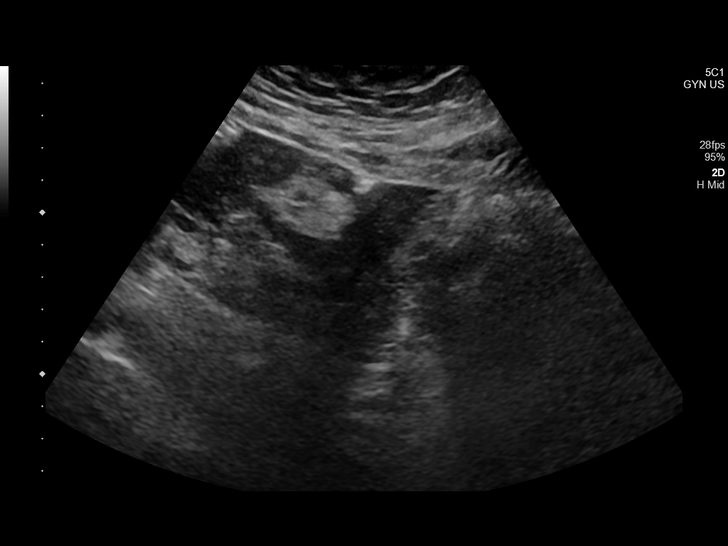
[im 43/74]
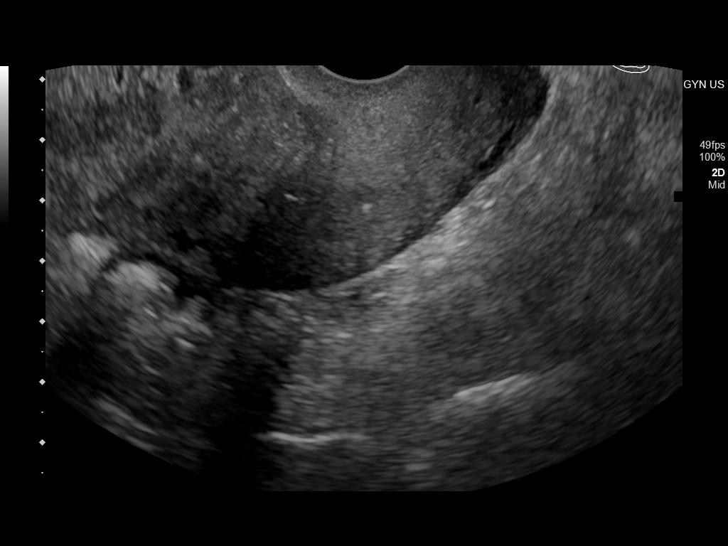
[im 49/74]
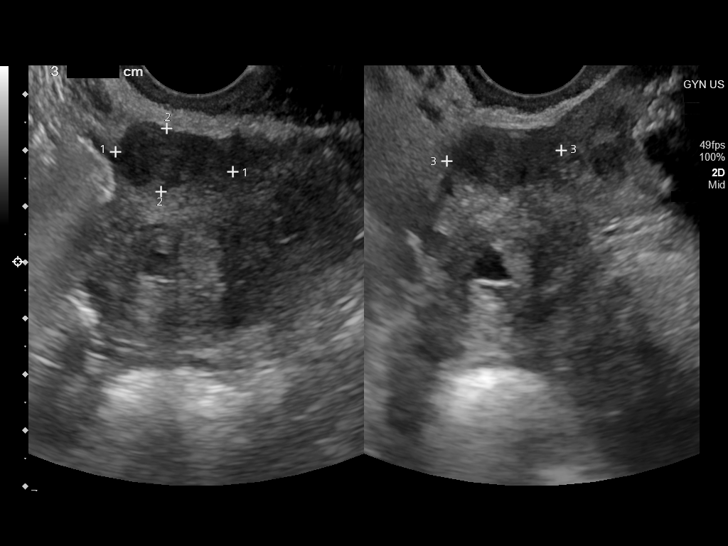
[im 55/74]
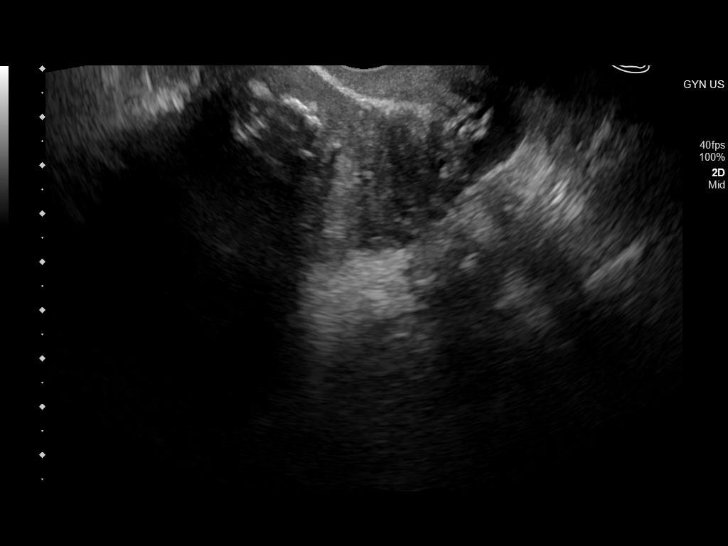
[im 61/74]
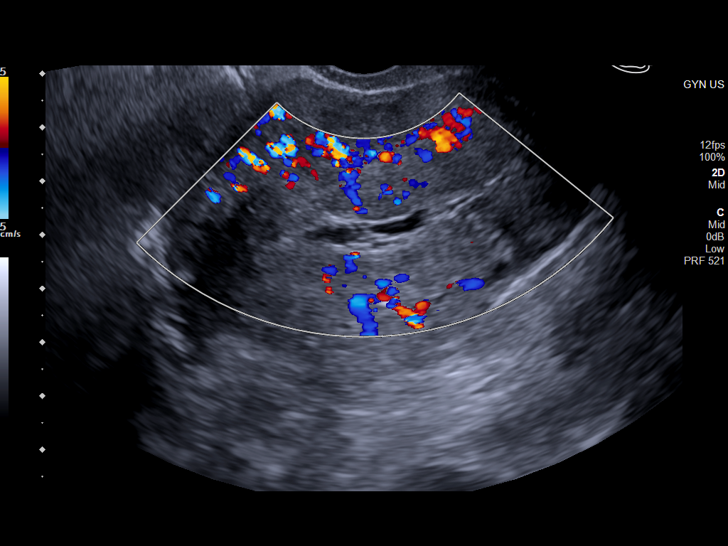
[im 67/74]
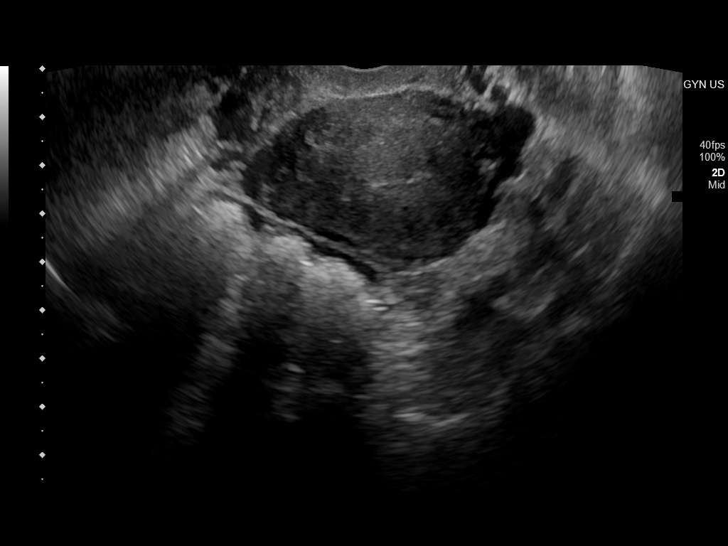
[im 74/74]
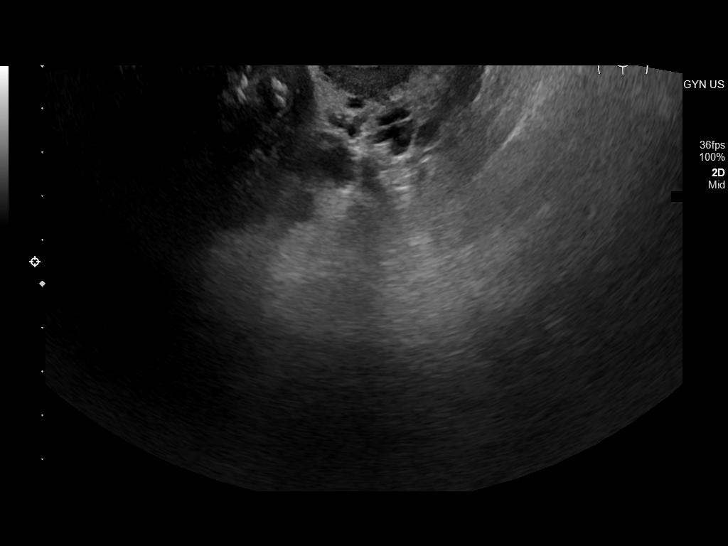

[13 of 25 positions shown; findings below may reference images not displayed]

FINDINGS: Uterus

Measurements: 12.5 x 6.3 x 6.7 cm = volume: 276 mL. Anteverted.
Heterogeneous myometrium. Multiple uterine leiomyomata. Subserosal
leiomyoma anterior upper uterus 2.1 cm greatest size. Transmural
leiomyoma posterior upper uterus 5.1 cm greatest size. Intramural
leiomyoma LEFT anterolateral uterus 3.3 cm diameter, containing
multiple shadowing calculi.

Endometrium

Thickness: 3 mm. Question endometrial polyp 10 x 3 x 8 mm. Small
amount of nonspecific endometrial fluid.

Right ovary

Not visualized, likely obscured by bowel

Left ovary

Not visualized, likely obscured by bowel

Other findings

No free pelvic fluid.  No adnexal masses.
IMPRESSION: Nonvisualization of ovaries.

Small amount of endometrial fluid with question endometrial polyp 10
x 3 x 8 mm; endometrial biopsy recommended to exclude malignancy.

## 2023-07-09 DIAGNOSIS — E1169 Type 2 diabetes mellitus with other specified complication: Secondary | ICD-10-CM | POA: Diagnosis not present

## 2023-07-09 DIAGNOSIS — I700 Atherosclerosis of aorta: Secondary | ICD-10-CM | POA: Diagnosis not present

## 2023-07-09 DIAGNOSIS — R55 Syncope and collapse: Secondary | ICD-10-CM | POA: Diagnosis not present

## 2023-07-09 DIAGNOSIS — R0602 Shortness of breath: Secondary | ICD-10-CM | POA: Diagnosis not present

## 2023-07-09 DIAGNOSIS — I10 Essential (primary) hypertension: Secondary | ICD-10-CM | POA: Diagnosis not present

## 2023-07-18 ENCOUNTER — Ambulatory Visit: Payer: Medicare HMO

## 2023-07-18 DIAGNOSIS — Z719 Counseling, unspecified: Secondary | ICD-10-CM

## 2023-07-18 DIAGNOSIS — Z23 Encounter for immunization: Secondary | ICD-10-CM

## 2023-07-18 NOTE — Progress Notes (Signed)
Pt seen in clinic for covid vaccine. Eligible, administered Moderna SpikeVax 12y+, yr 2024-2025. Monitored for 15 min without problems. Given VIS and NCIR copy, explained and understood. M.Tosha Belgarde, LPN.

## 2023-07-19 ENCOUNTER — Ambulatory Visit: Payer: Medicare PPO | Admitting: Physician Assistant

## 2023-07-22 NOTE — Progress Notes (Unsigned)
Complete physical exam  Patient: Vanessa Hogan   DOB: 1940-12-21   82 y.o. Female  MRN: [id number] Visit Date: 07/25/2023  Today's healthcare provider: Debera Lat, PA-C   No chief complaint on file.  Subjective    Vanessa Hogan is a 82 y.o. female who presents today for a complete physical exam.  She reports consuming a {diet types:17450} diet. {Exercise:19826} She generally feels {well/fairly well/poorly:18703}. She reports sleeping {well/fairly well/poorly:18703}. She {does/does not:200015} have additional problems to discuss today.  HPI  *** Discussed the use of AI scribe software for clinical note transcription with the patient, who gave verbal consent to proceed.  History of Present Illness            Last depression screening scores    06/26/2023   11:53 AM 04/20/2023    3:00 PM 02/27/2023   10:59 AM  PHQ 2/9 Scores  PHQ - 2 Score 2 0 2  PHQ- 9 Score 6 2 9    Last fall risk screening    04/20/2023    3:00 PM  Fall Risk   Falls in the past year? 0  Risk for fall due to : No Fall Risks   Last Audit-C alcohol use screening    04/20/2023    3:01 PM  Alcohol Use Disorder Test (AUDIT)  1. How often do you have a drink containing alcohol? 0   A score of 3 or more in women, and 4 or more in men indicates increased risk for alcohol abuse, EXCEPT if all of the points are from question 1   Past Medical History:  Diagnosis Date  . Allergy   . Anemia   . Anxiety   . Arthritis   . Depression   . Diabetes mellitus without complication (HCC)   . GERD (gastroesophageal reflux disease)   . Gout   . Hypertension   . Peptic ulcer 2023  . Thyroid disease    Past Surgical History:  Procedure Laterality Date  . BREAST SURGERY    . COLONOSCOPY WITH PROPOFOL N/A 03/28/2023   Procedure: COLONOSCOPY WITH PROPOFOL;  Surgeon: Toney Reil, MD;  Location: [facility] ENDOSCOPY;  Service: Gastroenterology;  Laterality: N/A;  .  ESOPHAGOGASTRODUODENOSCOPY (EGD) WITH PROPOFOL N/A 03/28/2023   Procedure: ESOPHAGOGASTRODUODENOSCOPY (EGD) WITH PROPOFOL;  Surgeon: Toney Reil, MD;  Location: [facility] ENDOSCOPY;  Service: Gastroenterology;  Laterality: N/A;  . TUBAL LIGATION     Social History   Socioeconomic History  . Marital status: Married    Spouse name: Not on file  . Number of children: Not on file  . Years of education: Not on file  . Highest education level: Not on file  Occupational History  . Not on file  Tobacco Use  . Smoking status: Former    Current packs/day: 0.00    Average packs/day: 0.3 packs/day for 5.0 years (1.3 ttl pk-yrs)    Types: Cigarettes    Start date: 50    Quit date: 110    Years since quitting: 52.7  . Smokeless tobacco: Former  Advertising account planner  . Vaping status: Never Used  Substance and Sexual Activity  . Alcohol use: Yes    Alcohol/week: 1.0 standard drink of alcohol    Types: 1 Glasses of wine per week    Comment: occasionally  . Drug use: Never  . Sexual activity: Not Currently    Birth control/protection: None  Other Topics Concern  . Not on file  Social History Narrative  .  Not on file   Social Determinants of Health   Financial Resource Strain: Medium Risk (01/02/2023)   Overall Financial Resource Strain (CARDIA)   . Difficulty of Paying Living Expenses: Somewhat hard  Food Insecurity: Food Insecurity Present (01/02/2023)   Hunger Vital Sign   . Worried About Programme researcher, broadcasting/film/video in the Last Year: Sometimes true   . Ran Out of Food in the Last Year: Sometimes true  Transportation Needs: No Transportation Needs (01/02/2023)   PRAPARE - Transportation   . Lack of Transportation (Medical): No   . Lack of Transportation (Non-Medical): No  Physical Activity: Insufficiently Active (01/02/2023)   Exercise Vital Sign   . Days of Exercise per Week: 7 days   . Minutes of Exercise per Session: 10 min  Stress: No Stress Concern Present (12/26/2022)   [employer]  of Occupational Health - Occupational Stress Questionnaire   . Feeling of Stress : Only a little  Social Connections: Unknown (01/28/2023)   [facility], [facility]   Social Network   . Social Network: Not on file  Recent Concern: Social Connections - Moderately Isolated (12/26/2022)   Social Connection and Isolation Panel [NHANES]   . Frequency of Communication with Friends and Family: More than three times a week   . Frequency of Social Gatherings with Friends and Family: Three times a week   . Attends Religious Services: More than 4 times per year   . Active Member of Clubs or Organizations: No   . Attends Banker Meetings: Never   . Marital Status: Separated  Intimate Partner Violence: Unknown (01/28/2023)   Received from [facility], [facility]   HITS   . Physically Hurt: Not on file   . Insult or Talk Down To: Not on file   . Threaten Physical Harm: Not on file   . Scream or Curse: Not on file   Family Status  Relation Name Status  . Mother  Deceased  . Father  Deceased  . Sister  (Not Specified)  . Brother  (Not Specified)  . Daughter  (Not Specified)  . Son  (Not Specified)  No partnership data on file   Family History  Problem Relation Age of Onset  . Stroke Mother   . Stroke Father   . Dementia Sister   . Hypertension Brother   . Diabetes Daughter   . Hypertension Son    Allergies  Allergen Reactions  . Asa [Aspirin]   . Metformin And Related Other (See Comments)    headache  . Morphine And Codeine   . Nitroglycerin Other (See Comments)    Headache  . Penicillins Hives  . Sertraline Other (See Comments)    Felt poorly    Patient Care Team: Cherlynn Polo as PCP - General (Physician Assistant)   Medications: Outpatient Medications Prior to Visit  Medication Sig  . ACCU-CHEK GUIDE test strip 1 each by Other route 3 (three) times daily.  . Accu-Chek Softclix Lancets lancets Use as instructed  . cloNIDine  (CATAPRES) 0.2 MG tablet Take 1 tablet (0.2 mg total) by mouth 2 (two) times daily.  Marland Kitchen COVID-19 At-Home Test KIT Use as directed  . insulin aspart protamine - aspart (NOVOLOG MIX 70/30 FLEXPEN) (70-30) 100 UNIT/ML FlexPen INJECT 18 UNITS SUBCUTANEOUSLY EVERY MORNING AND 20 UNITS EVERY EVENING; if having any lows, can also consider reducing by up to 20% and then monitor to see if needs to adjust back up  . Insulin Pen  Needle (BD PEN NEEDLE NANO 2ND GEN) 32G X 4 MM MISC 1 each by Does not apply route in the morning, at noon, and at bedtime.  . Insulin Pen Needle 32G X 4 MM MISC Use to inject insulin twice daily  . pantoprazole (PROTONIX) 40 MG tablet TAKE 1 TABLET(40 MG) BY MOUTH DAILY  . rosuvastatin (CRESTOR) 40 MG tablet Take 1 tablet (40 mg total) by mouth daily. Increased from 10 mg. Take only 40 mg daily. Cholesterol/Stroke/heart attack prevention. (Patient not taking: Reported on 06/26/2023)  . Semaglutide,0.25 or 0.5MG /DOS, (OZEMPIC, 0.25 OR 0.5 MG/DOSE,) 2 MG/3ML SOPN 0.25mg  weekly for 1 month, then increase to 0.5mg  weekly  . valsartan-hydrochlorothiazide (DIOVAN-HCT) 160-12.5 MG tablet Take 1 tablet by mouth daily.   No facility-administered medications prior to visit.    Review of Systems  All other systems reviewed and are negative. Except see HPI  {Insert previous labs (optional):23779} {See past labs  Heme  Chem  Endocrine  Serology  Results Review (optional):1}  Objective    There were no vitals taken for this visit. {Insert last BP/Wt (optional):23777}{See vitals history (optional):1}    Physical Exam Vitals reviewed.  Constitutional:      General: She is not in acute distress.    Appearance: Normal appearance. She is well-developed. She is not ill-appearing, toxic-appearing or diaphoretic.  HENT:     Head: Normocephalic and atraumatic.     Right Ear: Tympanic membrane, ear canal and external ear normal.     Left Ear: Tympanic membrane, ear canal and external ear  normal.     Nose: Nose normal. No congestion or rhinorrhea.     Mouth/Throat:     Mouth: Mucous membranes are moist.     Pharynx: Oropharynx is clear. No oropharyngeal exudate.  Eyes:     General: No scleral icterus.       Right eye: No discharge.        Left eye: No discharge.     Conjunctiva/sclera: Conjunctivae normal.     Pupils: Pupils are equal, round, and reactive to light.  Neck:     Thyroid: No thyromegaly.     Vascular: No carotid bruit.  Cardiovascular:     Rate and Rhythm: Normal rate and regular rhythm.     Pulses: Normal pulses.     Heart sounds: Normal heart sounds. No murmur heard.    No friction rub. No gallop.  Pulmonary:     Effort: Pulmonary effort is normal. No respiratory distress.     Breath sounds: Normal breath sounds. No wheezing or rales.  Abdominal:     General: Abdomen is flat. Bowel sounds are normal. There is no distension.     Palpations: Abdomen is soft. There is no mass.     Tenderness: There is no abdominal tenderness. There is no right CVA tenderness, left CVA tenderness, guarding or rebound.     Hernia: No hernia is present.  Musculoskeletal:        General: No swelling, tenderness, deformity or signs of injury. Normal range of motion.     Cervical back: Normal range of motion and neck supple. No rigidity or tenderness.     Right lower leg: No edema.     Left lower leg: No edema.  Lymphadenopathy:     Cervical: No cervical adenopathy.  Skin:    General: Skin is warm and dry.     Coloration: Skin is not jaundiced or pale.     Findings: No bruising, erythema, lesion or  rash.  Neurological:     Mental Status: She is alert and oriented to person, place, and time. Mental status is at baseline.     Gait: Gait normal.  Psychiatric:        Mood and Affect: Mood normal.        Behavior: Behavior normal.        Thought Content: Thought content normal.        Judgment: Judgment normal.     No results found for any visits on 07/25/23.   Assessment & Plan    Routine Health Maintenance and Physical Exam  Exercise Activities and Dietary recommendations  Goals     . DIET - EAT MORE FRUITS AND VEGETABLES    . Fluctuating costs of medication     Interventions Today    Flowsheet Row Most Recent Value  Chronic Disease   Chronic disease during today's visit Diabetes  Pharmacy Interventions   Pharmacy Dicussed/Reviewed Affording Medications  [Pt concerned about fluctuating cost of her novolog, went from 35.00 to 75.00-per patient Rx will need to be written a certain way to get the medication for 35.00-patient has discussed this with providers offic, [employer] and reported the discrepency to DHHS]          . Patient Stated     Would like to increase physical activity        Immunization History  Administered Date(s) Administered  . Moderna Covid-19 Fall Seasonal Vaccine 67yrs & older 07/18/2023  . Moderna Covid-19 Vaccine Bivalent Booster 20yrs & up 08/15/2022  . Moderna SARS-COV2 Booster Vaccination 01/29/2020, 02/26/2020  . Moderna Sars-Covid-2 Vaccination 01/29/2020, 02/26/2020, 11/18/2020, 06/06/2021  . PNEUMOCOCCAL CONJUGATE-20 09/28/2022  . Pneumococcal-Unspecified 09/21/2016  . Td 12/28/2022    Health Maintenance  Topic Date Due  . Zoster (Shingles) Vaccine (1 of 2) Never done  . DEXA scan (bone density measurement)  Never done  . Hemoglobin A1C  06/30/2023  . Flu Shot  01/21/2024*  . COVID-19 Vaccine (7 - 2023-24 season) 09/12/2023  . Medicare Annual Wellness Visit  12/26/2023  . Yearly kidney health urinalysis for diabetes  12/28/2023  . Complete foot exam   12/28/2023  . Yearly kidney function blood test for diabetes  02/19/2024  . Eye exam for diabetics  05/22/2024  . DTaP/Tdap/Td vaccine (2 - Tdap) 12/27/2032  . Pneumonia Vaccine  Completed  . HPV Vaccine  Aged Out  *Topic was postponed. The date shown is not the original due date.    Discussed health benefits of physical activity, and  encouraged her to engage in regular exercise appropriate for her age and condition.  Assessment and Plan              ***  No follow-ups on file.     [facility]

## 2023-07-25 ENCOUNTER — Other Ambulatory Visit: Payer: Self-pay | Admitting: Physician Assistant

## 2023-07-25 ENCOUNTER — Encounter: Payer: Self-pay | Admitting: Physician Assistant

## 2023-07-25 ENCOUNTER — Ambulatory Visit (INDEPENDENT_AMBULATORY_CARE_PROVIDER_SITE_OTHER): Payer: Medicare HMO | Admitting: Physician Assistant

## 2023-07-25 VITALS — BP 170/97 | HR 71 | Resp 16 | Ht 65.0 in | Wt 207.0 lb

## 2023-07-25 DIAGNOSIS — Z0000 Encounter for general adult medical examination without abnormal findings: Secondary | ICD-10-CM

## 2023-07-25 DIAGNOSIS — I10 Essential (primary) hypertension: Secondary | ICD-10-CM | POA: Diagnosis not present

## 2023-07-25 MED ORDER — HYDROCHLOROTHIAZIDE 12.5 MG PO TABS: 12.5000 mg | ORAL_TABLET | Freq: Every day | ORAL | 3 refills | Status: DC

## 2023-07-25 MED ORDER — HYDROCHLOROTHIAZIDE 12.5 MG PO TABS
12.5000 mg | ORAL_TABLET | Freq: Every day | ORAL | 3 refills | Status: DC
Start: 2023-07-25 — End: 2023-07-25

## 2023-07-26 ENCOUNTER — Telehealth: Payer: Self-pay

## 2023-07-26 LAB — TSH: TSH: 0.991 u[IU]/mL (ref 0.450–4.500)

## 2023-07-26 NOTE — Progress Notes (Signed)
Tsh is normal but low normal value. Please, check with insurance if they will cover Ultrasound to check your enlarged thyroid gland

## 2023-07-26 NOTE — Telephone Encounter (Signed)
-----   Message from Terrell sent at 07/26/2023  6:55 AM EDT ----- Tsh is normal but low normal value. Please, check with insurance if they will cover Ultrasound to check your enlarged thyroid gland

## 2023-08-03 ENCOUNTER — Other Ambulatory Visit: Payer: Self-pay | Admitting: Physician Assistant

## 2023-08-03 NOTE — Telephone Encounter (Signed)
Requested Prescriptions  Pending Prescriptions Disp Refills   pantoprazole (PROTONIX) 40 MG tablet [Pharmacy Med Name: PANTOPRAZOLE 40MG  TABLETS] 90 tablet 0    Sig: TAKE 1 TABLET(40 MG) BY MOUTH DAILY     Gastroenterology: Proton Pump Inhibitors Passed - 08/03/2023  8:25 AM      Passed - Valid encounter within last 12 months    Recent Outpatient Visits           1 week ago Annual physical exam   [place] [facility] [place], [place], PA-C   1 month ago Hypertension associated with diabetes [facility])   [place] [facility] [place], [place], PA-C   2 months ago Hypertension associated with diabetes [facility])   [place] [facility] Merita Norton T, FNP   3 months ago Gastroesophageal reflux disease, unspecified whether esophagitis present   [place] [facility] [place], [place], PA-C   5 months ago Gastroesophageal reflux disease, unspecified whether esophagitis present   [place] [facility] [place], Oralia Rud, DO       Future Appointments             In 2 weeks [place], Edmon Crape, PA-C [facility], [facility]

## 2023-08-07 ENCOUNTER — Emergency Department
Admission: EM | Admit: 2023-08-07 | Discharge: 2023-08-07 | Disposition: A | Discharge status: Home / Self Care | Payer: Medicare HMO | Attending: Emergency Medicine | Admitting: Emergency Medicine

## 2023-08-07 DIAGNOSIS — I10 Essential (primary) hypertension: Secondary | ICD-10-CM | POA: Diagnosis not present

## 2023-08-07 DIAGNOSIS — Z79899 Other long term (current) drug therapy: Secondary | ICD-10-CM | POA: Insufficient documentation

## 2023-08-07 DIAGNOSIS — E119 Type 2 diabetes mellitus without complications: Secondary | ICD-10-CM | POA: Insufficient documentation

## 2023-08-07 DIAGNOSIS — Z794 Long term (current) use of insulin: Secondary | ICD-10-CM | POA: Insufficient documentation

## 2023-08-07 LAB — BASIC METABOLIC PANEL
Anion gap: 10 (ref 5–15)
BUN: 11 mg/dL (ref 8–23)
CO2: 28 mmol/L (ref 22–32)
Calcium: 9.2 mg/dL (ref 8.9–10.3)
Chloride: 101 mmol/L (ref 98–111)
Creatinine, Ser: 0.92 mg/dL (ref 0.44–1.00)
GFR, Estimated: >60 mL/min (ref >60)
Glucose, Bld: 87 mg/dL (ref 70–99)
Potassium: 3.8 mmol/L (ref 3.5–5.1)
Sodium: 139 mmol/L (ref 135–145)

## 2023-08-07 LAB — CBC WITH DIFFERENTIAL/PLATELET
Abs Immature Granulocytes: 0.01 10*3/uL (ref 0.00–0.07)
Basophils Absolute: 0 10*3/uL (ref 0.0–0.1)
Basophils Relative: 1 %
Eosinophils Absolute: 0.1 10*3/uL (ref 0.0–0.5)
Eosinophils Relative: 2 %
HCT: 44.8 % (ref 36.0–46.0)
Hemoglobin: 14.1 g/dL (ref 12.0–15.0)
Immature Granulocytes: 0 %
Lymphocytes Relative: 37 %
Lymphs Abs: 1.7 10*3/uL (ref 0.7–4.0)
MCH: 24.1 pg — ABNORMAL LOW (ref 26.0–34.0)
MCHC: 31.5 g/dL (ref 30.0–36.0)
MCV: 76.7 fL — ABNORMAL LOW (ref 80.0–100.0)
Monocytes Absolute: 0.3 10*3/uL (ref 0.1–1.0)
Monocytes Relative: 7 %
Neutro Abs: 2.5 10*3/uL (ref 1.7–7.7)
Neutrophils Relative %: 53 %
Platelets: 227 10*3/uL (ref 150–400)
RBC: 5.84 MIL/uL — ABNORMAL HIGH (ref 3.87–5.11)
RDW: 16.7 % — ABNORMAL HIGH (ref 11.5–15.5)
WBC: 4.6 10*3/uL (ref 4.0–10.5)
nRBC: 0 % (ref 0.0–0.2)

## 2023-08-07 NOTE — ED Notes (Signed)
Patient states she has covid vaccine in LUE 07/25/23.

## 2023-08-07 NOTE — Discharge Instructions (Addendum)
Your lab work today was reassuring.  I recommend close follow-up with your PCP and cardiologist to discuss any potential changes in your blood pressure medication regimen.  Please continue everything as prescribed currently.  I recommend a low-salt diet.  Please return to the emergency department if you have severe headache, vision changes, numbness or weakness on one side of your body, chest pain or shortness of breath.

## 2023-08-07 NOTE — ED Provider Notes (Signed)
[facility] Provider Note    Event Date/Time   First MD Initiated Contact with Patient 08/07/23 0240     (approximate)   History   Hypertension (Brought in by medic from home with complaints of hypertension and headache. States she has a history of htn and had been taking medication all day with no relief. In addition, states she has arm weakness that started yesterday. Denies SOB, dizziness, and chest pain. )   HPI  Vanessa Hogan is a 82 y.o. female with history of hypertension, diabetes, anxiety who presents emergency department with hypertension.  States she checked her blood pressure tonight and was elevated.  States she rechecked it again multiple times and it continued to rise.  She has been compliant with her blood pressure medication.  She denies having a headache despite triage note.  She states she just had a "sensation" in the top of her head.  No vision changes.  No numbness, tingling or weakness.  No chest pain or shortness of breath.  Currently completely asymptomatic.  Blood pressure right now is 160/81.   History provided by patient, EMS.    Past Medical History:  Diagnosis Date   Allergy    Anemia    Anxiety    Arthritis    Depression    Diabetes mellitus without complication (HCC)    GERD (gastroesophageal reflux disease)    Gout    Hypertension    Peptic ulcer 2023   Thyroid disease     Past Surgical History:  Procedure Laterality Date   BREAST SURGERY     COLONOSCOPY WITH PROPOFOL N/A 03/28/2023   Procedure: COLONOSCOPY WITH PROPOFOL;  Surgeon: Toney Reil, MD;  Location: [facility] ENDOSCOPY;  Service: Gastroenterology;  Laterality: N/A;   ESOPHAGOGASTRODUODENOSCOPY (EGD) WITH PROPOFOL N/A 03/28/2023   Procedure: ESOPHAGOGASTRODUODENOSCOPY (EGD) WITH PROPOFOL;  Surgeon: Toney Reil, MD;  Location: [facility] ENDOSCOPY;  Service: Gastroenterology;  Laterality: N/A;   TUBAL LIGATION      MEDICATIONS:  Prior to  Admission medications   Medication Sig Start Date End Date Taking? Authorizing Provider  ACCU-CHEK GUIDE test strip 1 each by Other route 3 (three) times daily. 05/11/23   Debera Lat, PA-C  Accu-Chek Softclix Lancets lancets Use as instructed 05/14/23   Debera Lat, PA-C  cloNIDine (CATAPRES) 0.2 MG tablet Take 1 tablet (0.2 mg total) by mouth 2 (two) times daily. 05/18/23   Jacky Kindle, FNP  hydrochlorothiazide (HYDRODIURIL) 12.5 MG tablet Take 1 tablet (12.5 mg total) by mouth daily. 07/25/23   Ostwalt, Edmon Crape, PA-C  insulin aspart protamine - aspart (NOVOLOG MIX 70/30 FLEXPEN) (70-30) 100 UNIT/ML FlexPen INJECT 18 UNITS SUBCUTANEOUSLY EVERY MORNING AND 20 UNITS EVERY EVENING; if having any lows, can also consider reducing by up to 20% and then monitor to see if needs to adjust back up 05/07/23   Ostwalt, Edmon Crape, PA-C  insulin lispro protamine-lispro (HUMALOG 75/25 MIX) (75-25) 100 UNIT/ML SUSP injection Inject into the skin.    [provider]  Insulin Pen Needle (BD PEN NEEDLE NANO 2ND GEN) 32G X 4 MM MISC 1 each by Does not apply route in the morning, at noon, and at bedtime. 03/21/23   Olevia Perches P, DO  Insulin Pen Needle 32G X 4 MM MISC Use to inject insulin twice daily 02/16/23   Olevia Perches P, DO  LORazepam (ATIVAN) 0.5 MG tablet Take 0.5 mg by mouth every 8 (eight) hours.    [provider]  pantoprazole (PROTONIX)  40 MG tablet TAKE 1 TABLET(40 MG) BY MOUTH DAILY 08/03/23   Ostwalt, Edmon Crape, PA-C  rosuvastatin (CRESTOR) 40 MG tablet Take 1 tablet (40 mg total) by mouth daily. Increased from 10 mg. Take only 40 mg daily. Cholesterol/Stroke/heart attack prevention. Patient not taking: Reported on 07/25/2023 05/18/23   Jacky Kindle, FNP  Semaglutide,0.25 or 0.5MG /DOS, (OZEMPIC, 0.25 OR 0.5 MG/DOSE,) 2 MG/3ML SOPN 0.25mg  weekly for 1 month, then increase to 0.5mg  weekly 11/05/22   Olevia Perches P, DO  valsartan-hydrochlorothiazide (DIOVAN-HCT) 160-12.5 MG tablet Take 1  tablet by mouth daily. 05/14/23   Debera Lat, PA-C    Physical Exam   Triage Vital Signs: ED Triage Vitals  Encounter Vitals Group     BP 08/07/23 0219 (!) 190/73     Systolic BP Percentile --      Diastolic BP Percentile --      Pulse Rate 08/07/23 0219 78     Resp 08/07/23 0230 15     Temp 08/07/23 0215 (!) 97.5 F (36.4 C)     Temp Source 08/07/23 0215 Oral     SpO2 08/07/23 0215 99 %     Weight --      Height --      Head Circumference --      Peak Flow --      Pain Score 08/07/23 0219 0     Pain Loc --      Pain Education --      Exclude from Growth Chart --     Most recent vital signs: Vitals:   08/07/23 0230 08/07/23 0300  BP: (!) 160/81 (!) 158/63  Pulse: 73 67  Resp: 15 16  Temp:    SpO2: 99% 97%    CONSTITUTIONAL: Alert, responds appropriately to questions. Well-appearing; well-nourished HEAD: Normocephalic, atraumatic EYES: Conjunctivae clear, pupils appear equal, sclera nonicteric ENT: normal nose; moist mucous membranes NECK: Supple, normal ROM CARD: RRR; S1 and S2 appreciated RESP: Normal chest excursion without splinting or tachypnea; breath sounds clear and equal bilaterally; no wheezes, no rhonchi, no rales, no hypoxia or respiratory distress, speaking full sentences ABD/GI: Non-distended; soft, non-tender, no rebound, no guarding, no peritoneal signs BACK: The back appears normal EXT: Normal ROM in all joints; no deformity noted, no edema SKIN: Normal color for age and race; warm; no rash on exposed skin NEURO: Moves all extremities equally, normal speech, normal sensation, no facial asymmetry PSYCH: The patient's mood and manner are appropriate.   ED Results / Procedures / Treatments   LABS: (all labs ordered are listed, but only abnormal results are displayed) Labs Reviewed  CBC WITH DIFFERENTIAL/PLATELET - Abnormal; Notable for the following components:      Result Value   RBC 5.84 (*)    MCV 76.7 (*)    MCH 24.1 (*)    RDW 16.7  (*)    All other components within normal limits  BASIC METABOLIC PANEL     EKG:  EKG Interpretation Date/Time:  Tuesday August 07 2023 02:19:20 EDT Ventricular Rate:  79 PR Interval:  246 QRS Duration:  142 QT Interval:  424 QTC Calculation: 487 R Axis:   -51  Text Interpretation: Sinus rhythm Prolonged PR interval Consider left atrial enlargement Right bundle branch block No significant change since last tracing Confirmed by Rochele Raring [phone]) on 08/07/2023 2:40:51 AM         RADIOLOGY: My personal review and interpretation of imaging:    I have personally reviewed all radiology reports.  No results found.   PROCEDURES:  Critical Care performed: No   CRITICAL CARE Performed by: Rochele Raring   Total critical care time: 0 minutes  Critical care time was exclusive of separately billable procedures and treating other patients.  Critical care was necessary to treat or prevent imminent or life-threatening deterioration.  Critical care was time spent personally by me on the following activities: development of treatment plan with patient and/or surrogate as well as nursing, discussions with consultants, evaluation of patient's response to treatment, examination of patient, obtaining history from patient or surrogate, ordering and performing treatments and interventions, ordering and review of laboratory studies, ordering and review of radiographic studies, pulse oximetry and re-evaluation of patient's condition.   Marland Kitchen1-3 Lead EKG Interpretation  Performed by: Jenipher Havel, Layla Maw, DO Authorized by: Sloan Takagi, Layla Maw, DO     Interpretation: normal     ECG rate:  67   ECG rate assessment: normal     Rhythm: sinus rhythm     Ectopy: none     Conduction: normal       IMPRESSION / MDM / ASSESSMENT AND PLAN / ED COURSE  I reviewed the triage vital signs and the nursing notes.    Patient here with asymptomatic hypertension.  The patient is on the cardiac monitor  to evaluate for evidence of arrhythmia and/or significant heart rate changes.   DIFFERENTIAL DIAGNOSIS (includes but not limited to):   Patient here with asymptomatic hypertension.  Doubt hypertensive urgency, emergency.  Doubt CVA, intracranial hemorrhage.  Doubt ACS.   Patient's presentation is most consistent with acute complicated illness / injury requiring diagnostic workup.   PLAN: Will obtain basic labs to ensure no signs of endorgan damage.  No complaints of headache, neurologic changes, chest pain or shortness of breath.  No indication for emergent imaging.  Blood pressure has already improved to the 150s/60s.  Will have her follow-up with her PCP and cardiologist for further management of her hypertension as an outpatient.   MEDICATIONS GIVEN IN ED: Medications - No data to display   ED COURSE: Labs show normal hemoglobin, normal creatinine, electrolytes.  I feel she is safe to be discharged home and follow-up with her PCP and cardiologist as an outpatient.  I will have her continue her medications currently as prescribed.   At this time, I do not feel there is any life-threatening condition present. I reviewed all nursing notes, vitals, pertinent previous records.  All lab and urine results, EKGs, imaging ordered have been independently reviewed and interpreted by myself.  I reviewed all available radiology reports from any imaging ordered this visit.  Based on my assessment, I feel the patient is safe to be discharged home without further emergent workup and can continue workup as an outpatient as needed. Discussed all findings, treatment plan as well as usual and customary return precautions.  They verbalize understanding and are comfortable with this plan.  Outpatient follow-up has been provided as needed.  All questions have been answered.    CONSULTS:  none   OUTSIDE RECORDS REVIEWED: Reviewed last cardiology note on 07/09/2023.  Blood pressure in their office at that time  was 144/86.  Patient is on clonidine 0.2 mg twice daily, Diovan/HCTZ 160/12.5 mg daily.       FINAL CLINICAL IMPRESSION(S) / ED DIAGNOSES   Final diagnoses:  Asymptomatic hypertension     Rx / DC Orders   ED Discharge Orders     None  Note:  This document was prepared using Dragon voice recognition software and may include unintentional dictation errors.   Akayla Brass, Layla Maw, DO 08/07/23 [phone]

## 2023-08-08 NOTE — Progress Notes (Signed)
Established patient visit  Patient: Vanessa Hogan   DOB: 01/14/1941   82 y.o. Female  MRN: [id number] Visit Date: 08/09/2023  Today's healthcare provider: Debera Lat, PA-C   Chief Complaint  Patient presents with   Follow-up    ED follow up for HTN   Subjective     Discussed the use of AI scribe software for clinical note transcription with the patient, who gave verbal consent to proceed.  History of Present Illness   The patient, with a history of hypertension, presents with a recent hypertensive crisis. The patient reports a blood pressure reading of 165/88, which prompted an emergency room visit. The patient also experienced headaches, which they attributed to the high blood pressure. The patient's blood pressure is usually controlled with Valsartan and Hydrochlorothiazide.  The patient also reports anxiety, for which they take Lorazepam as needed. The patient uses this medication infrequently and only when experiencing significant distress, such as after nightmares. The patient is considering getting a dog for emotional support to help manage their anxiety.  In addition to the hypertension and anxiety, the patient is experiencing sinus issues. The patient reports daily sinus congestion and watery eyes, which they believe may be contributing to their headaches.           07/25/2023   10:09 AM 06/26/2023   11:53 AM 04/20/2023    3:00 PM  Depression screen PHQ 2/9  Decreased Interest 1 1 0  Down, Depressed, Hopeless 1 1 0  PHQ - 2 Score 2 2 0  Altered sleeping 1 1 1   Tired, decreased energy 1 2 1   Change in appetite 1 0 0  Feeling bad or failure about yourself  1 0 0  Trouble concentrating 1 1 0  Moving slowly or fidgety/restless 0 0 0  Suicidal thoughts 0 0 0  PHQ-9 Score 7 6 2   Difficult doing work/chores   Not difficult at all      07/25/2023   10:10 AM 06/26/2023   11:52 AM 04/20/2023    3:00 PM 02/27/2023   11:00 AM  GAD 7 : Generalized Anxiety Score   Nervous, Anxious, on Edge 2 2 1 1   Control/stop worrying 2 1 1  0  Worry too much - different things 2 2 1 1   Trouble relaxing 2 2 0 1  Restless 1 0 0 0  Easily annoyed or irritable 1 0 1 0  Afraid - awful might happen 3 2 0 0  Total GAD 7 Score 13 9 4 3   Anxiety Difficulty   Not difficult at all Somewhat difficult    Medications: Outpatient Medications Prior to Visit  Medication Sig   ACCU-CHEK GUIDE test strip 1 each by Other route 3 (three) times daily.   Accu-Chek Softclix Lancets lancets Use as instructed   cloNIDine (CATAPRES) 0.2 MG tablet Take 1 tablet (0.2 mg total) by mouth 2 (two) times daily.   hydrochlorothiazide (HYDRODIURIL) 12.5 MG tablet Take 1 tablet (12.5 mg total) by mouth daily.   insulin aspart protamine - aspart (NOVOLOG MIX 70/30 FLEXPEN) (70-30) 100 UNIT/ML FlexPen INJECT 18 UNITS SUBCUTANEOUSLY EVERY MORNING AND 20 UNITS EVERY EVENING; if having any lows, can also consider reducing by up to 20% and then monitor to see if needs to adjust back up   insulin lispro protamine-lispro (HUMALOG 75/25 MIX) (75-25) 100 UNIT/ML SUSP injection Inject into the skin.   Insulin Pen Needle (BD PEN NEEDLE NANO 2ND GEN) 32G X 4 MM MISC 1 each by Does  not apply route in the morning, at noon, and at bedtime.   Insulin Pen Needle 32G X 4 MM MISC Use to inject insulin twice daily   pantoprazole (PROTONIX) 40 MG tablet TAKE 1 TABLET(40 MG) BY MOUTH DAILY   rosuvastatin (CRESTOR) 40 MG tablet Take 1 tablet (40 mg total) by mouth daily. Increased from 10 mg. Take only 40 mg daily. Cholesterol/Stroke/heart attack prevention.   Semaglutide,0.25 or 0.5MG /DOS, (OZEMPIC, 0.25 OR 0.5 MG/DOSE,) 2 MG/3ML SOPN 0.25mg  weekly for 1 month, then increase to 0.5mg  weekly   valsartan-hydrochlorothiazide (DIOVAN-HCT) 160-12.5 MG tablet Take 1 tablet by mouth daily.   [DISCONTINUED] LORazepam (ATIVAN) 0.5 MG tablet Take 0.5 mg by mouth every 8 (eight) hours.   No facility-administered medications prior  to visit.    Review of Systems  All other systems reviewed and are negative.  Except see HPI       Objective    BP (!) 144/84 (BP Location: Left Arm, Patient Position: Sitting, Cuff Size: Normal)   Pulse 70   Temp 97.7 F (36.5 C) (Oral)   Ht 5\' 6"  (1.676 m)   Wt 209 lb 8 oz (95 kg)   SpO2 96%   BMI 33.81 kg/m     Physical Exam Vitals reviewed.  Constitutional:      General: She is not in acute distress.    Appearance: Normal appearance. She is well-developed. She is not diaphoretic.  HENT:     Head: Normocephalic and atraumatic.     Right Ear: Ear canal and external ear normal.     Left Ear: Ear canal and external ear normal.     Ears:     Comments: Fluids behind tms    Nose: Congestion and rhinorrhea present.     Mouth/Throat:     Pharynx: No posterior oropharyngeal erythema.  Eyes:     General: No scleral icterus.       Right eye: No discharge.        Left eye: No discharge.     Extraocular Movements: Extraocular movements intact.     Conjunctiva/sclera: Conjunctivae normal.     Pupils: Pupils are equal, round, and reactive to light.  Neck:     Thyroid: No thyromegaly.  Cardiovascular:     Rate and Rhythm: Normal rate and regular rhythm.     Pulses: Normal pulses.     Heart sounds: Normal heart sounds. No murmur heard. Pulmonary:     Effort: Pulmonary effort is normal. No respiratory distress.     Breath sounds: Normal breath sounds. No wheezing, rhonchi or rales.  Musculoskeletal:     Cervical back: Neck supple.     Right lower leg: No edema.     Left lower leg: No edema.  Lymphadenopathy:     Cervical: No cervical adenopathy.  Skin:    General: Skin is warm and dry.     Findings: No rash.  Neurological:     Mental Status: She is alert and oriented to person, place, and time. Mental status is at baseline.  Psychiatric:        Mood and Affect: Mood normal.        Behavior: Behavior normal.      No results found for any visits on 08/09/23.   Assessment & Plan      Anxiety Insomnia Infrequent use of Lorazepam 0.5mg  for anxiety and insomnia. Patient reports using medication responsibly and as needed. Pt was informed about possible cognitive effect and other side effects of Lorazepam.  Pt expressed her understanding and agreed. -Continue Lorazepam 0.5mg  as needed for anxiety/insomnia. -Consider alternative medications if frequency of use increases. - LORazepam (ATIVAN) 0.5 MG tablet; Take 1 tablet by mouth at bedtime as needed  Dispense: 20 tablet; Refill: 0 Requested a letter for emotional animal for a better anxiety control  Primary hypertension Recent elevated blood pressure readings at home, with a peak of 188. Patient reports adherence to low salt diet, but recent dietary indiscretion with fried chicken. Blood pressure in office was 144. Possible equipment malfunction. -Advise patient to obtain a new blood pressure monitor. -Continue current antihypertensive regim 160-12.5mg  twice daily -Recheck blood pressure in office in 2 weeks.  Nasal congestion Chronic sinus congestion causing daily discomfort and watering eyes. -Advise patient to use nasal saline rinse daily. -Advise patient to use Flonase, two puffs in each nostril daily for one week, then one puff in each nostril daily until symptoms subside. -Encourage increased water intake.     Follow-up Scheduled for 2 weeks to recheck blood pressure and discuss new blood pressure monitor readings.      No follow-ups on file.     The patient was advised to call back or seek an in-person evaluation if the symptoms worsen or if the condition fails to improve as anticipated.  I discussed the assessment and treatment plan with the patient. The patient was provided an opportunity to ask questions and all were answered. The patient agreed with the plan and demonstrated an understanding of the instructions.  I, Debera Lat, PA-C have reviewed all documentation for this visit.  The documentation on  08/09/23  for the exam, diagnosis, procedures, and orders are all accurate and complete.  Debera Lat, [facility], MMS [facility] [phone] (phone) [phone] (fax)  [facility]

## 2023-08-09 ENCOUNTER — Encounter: Payer: Self-pay | Admitting: Physician Assistant

## 2023-08-09 ENCOUNTER — Ambulatory Visit: Payer: Medicare HMO | Admitting: Physician Assistant

## 2023-08-09 VITALS — BP 144/84 | HR 70 | Temp 97.7°F | Ht 66.0 in | Wt 209.5 lb

## 2023-08-09 DIAGNOSIS — F419 Anxiety disorder, unspecified: Secondary | ICD-10-CM | POA: Diagnosis not present

## 2023-08-09 DIAGNOSIS — Z09 Encounter for follow-up examination after completed treatment for conditions other than malignant neoplasm: Secondary | ICD-10-CM | POA: Diagnosis not present

## 2023-08-09 DIAGNOSIS — F515 Nightmare disorder: Secondary | ICD-10-CM

## 2023-08-09 DIAGNOSIS — R0981 Nasal congestion: Secondary | ICD-10-CM

## 2023-08-09 DIAGNOSIS — I10 Essential (primary) hypertension: Secondary | ICD-10-CM | POA: Diagnosis not present

## 2023-08-09 MED ORDER — LORAZEPAM 0.5 MG PO TABS: ORAL_TABLET | ORAL | 0 refills | Status: AC

## 2023-08-10 ENCOUNTER — Encounter: Payer: Self-pay | Admitting: Physician Assistant

## 2023-08-14 ENCOUNTER — Encounter: Payer: Self-pay | Admitting: Medical

## 2023-08-14 ENCOUNTER — Ambulatory Visit: Payer: Medicare HMO | Attending: Medical | Admitting: Medical

## 2023-08-14 VITALS — BP 150/88 | HR 89 | Ht 65.5 in | Wt 209.6 lb

## 2023-08-14 DIAGNOSIS — R600 Localized edema: Secondary | ICD-10-CM | POA: Diagnosis not present

## 2023-08-14 DIAGNOSIS — R079 Chest pain, unspecified: Secondary | ICD-10-CM | POA: Diagnosis not present

## 2023-08-14 DIAGNOSIS — I10 Essential (primary) hypertension: Secondary | ICD-10-CM

## 2023-08-14 DIAGNOSIS — R011 Cardiac murmur, unspecified: Secondary | ICD-10-CM | POA: Diagnosis not present

## 2023-08-14 MED ORDER — CLONIDINE HCL 0.1 MG PO TABS: 0.1000 mg | ORAL_TABLET | Freq: Every day | ORAL | 0 refills | Status: DC

## 2023-08-14 MED ORDER — HYDRALAZINE HCL 25 MG PO TABS
25.0000 mg | ORAL_TABLET | Freq: Two times a day (BID) | ORAL | 3 refills | Status: DC
Start: 2023-08-14 — End: 2023-09-07

## 2023-08-14 NOTE — Patient Instructions (Signed)
Medication Instructions:  Your physician recommends the following medication changes.  START TAKING: Hydralazine 25 mg by mouth twice a day  DECREASE: Clonidine to 0.1 mg by mouth daily for 3 days, then STOP  *If you need a refill on your cardiac medications before your next appointment, please call your pharmacy*   Lab Work: No labs ordered today    Testing/Procedures: Your physician has requested that you have an echocardiogram. Echocardiography is a painless test that uses sound waves to create images of your heart. It provides your doctor with information about the size and shape of your heart and how well your heart's chambers and valves are working.   You may receive an ultrasound enhancing agent through an IV if needed to better visualize your heart during the echo. This procedure takes approximately one hour.  There are no restrictions for this procedure.  This will take place at [address] [address], [place] [address]    Follow-Up: At [facility], you and your health needs are our priority.  As part of our continuing mission to provide you with exceptional heart care, we have created designated Provider Care Teams.  These Care Teams include your primary Cardiologist (physician) and Advanced Practice Providers (APPs -  Physician Assistants and Nurse Practitioners) who all work together to provide you with the care you need, when you need it.  We recommend signing up for the patient portal called "MyChart".  Sign up information is provided on this After Visit Summary.  MyChart is used to connect with patients for Virtual Visits (Telemedicine).  Patients are able to view lab/test results, encounter notes, upcoming appointments, etc.  Non-urgent messages can be sent to your provider as well.   To learn more about what you can do with MyChart, go to [email or web address].    Your next appointment:   3 -4 week(s)  Provider:   Terrilee Croak, PA-C

## 2023-08-14 NOTE — Progress Notes (Signed)
Cardiology Office Note:    Date:  08/14/2023   ID:  Vanessa Hogan, DOB 04-Oct-1941, MRN [id number]  PCP:  Debera Lat, PA-C  [facility] Cardiologist:  None  [facility] Electrophysiologist:  None   Referring MD: Debera Lat, PA-C   Chief Complaint: ER follow-up  History of Present Illness:    Vanessa Hogan is a 82 y.o. 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  Patient was initially seen in 2022 for evaluation of chest pain.  She reported prior treadmill stress test and Lexi Myoview's that were normal.  Plan was to reassess in a couple months.  She was last seen 2023 was overall stable from a cardiac perspective.  The patient went to the ER 08/07/23 for elevated BP. BP was 160/81. She was having no symptoms. It was as high was 190/73. Labs overall looked good.   Today, the patient reports high Bps at home SBP 120-160s. She was taking clonidine 0.2mg  BIS, but self decreased it to 0.2mg  daily. This morning she had chest pressure, it felt like indigestion. She took tums and this relieved it. Occasional SOB and lower leg edema. She eats no salt diet. Lance Bosch has rare lightheadedness and dizziness. She has had headaches in the AM.   She reports adverse side effects (muscle cramps and headaches) to Amlodipine, Lisinopril, micardis, triamterene, Losartan, Atenolol, Lopressor.   Past Medical History:  Diagnosis Date   Allergy    Anemia    Anxiety    Arthritis    Depression    Diabetes mellitus without complication (HCC)    GERD (gastroesophageal reflux disease)    Gout    Hypertension    Peptic ulcer 2023   Thyroid disease     Past Surgical History:  Procedure Laterality Date   BREAST SURGERY     COLONOSCOPY WITH PROPOFOL N/A 03/28/2023   Procedure: COLONOSCOPY WITH PROPOFOL;  Surgeon: Toney Reil, MD;  Location: [facility] ENDOSCOPY;  Service: Gastroenterology;  Laterality: N/A;    ESOPHAGOGASTRODUODENOSCOPY (EGD) WITH PROPOFOL N/A 03/28/2023   Procedure: ESOPHAGOGASTRODUODENOSCOPY (EGD) WITH PROPOFOL;  Surgeon: Toney Reil, MD;  [facility]: [facility] ENDOSCOPY;  Service: Gastroenterology;  Laterality: N/A;   TUBAL LIGATION      Current Medications: Current Meds  Medication Sig   ACCU-CHEK GUIDE test strip 1 each by Other route 3 (three) times daily.   Accu-Chek Softclix Lancets lancets Use as instructed   cloNIDine (CATAPRES) 0.1 MG tablet Take 1 tablet (0.1 mg total) by mouth daily for 3 days.   hydrALAZINE (APRESOLINE) 25 MG tablet Take 1 tablet (25 mg total) by mouth 2 (two) times daily.   hydrochlorothiazide (HYDRODIURIL) 12.5 MG tablet Take 1 tablet (12.5 mg total) by mouth daily.   insulin aspart protamine - aspart (NOVOLOG MIX 70/30 FLEXPEN) (70-30) 100 UNIT/ML FlexPen INJECT 18 UNITS SUBCUTANEOUSLY EVERY MORNING AND 20 UNITS EVERY EVENING; if having any lows, can also consider reducing by up to 20% and then monitor to see if needs to adjust back up   insulin lispro protamine-lispro (HUMALOG 75/25 MIX) (75-25) 100 UNIT/ML SUSP injection Inject into the skin.   Insulin Pen Needle (BD PEN NEEDLE NANO 2ND GEN) 32G X 4 MM MISC 1 each by Does not apply route in the morning, at noon, and at bedtime.   Insulin Pen Needle 32G X 4 MM MISC Use to inject insulin twice daily   LORazepam (ATIVAN) 0.5 MG tablet Take 1 tablet by mouth at  bedtime as needed   pantoprazole (PROTONIX) 40 MG tablet TAKE 1 TABLET(40 MG) BY MOUTH DAILY   rosuvastatin (CRESTOR) 40 MG tablet Take 1 tablet (40 mg total) by mouth daily. Increased from 10 mg. Take only 40 mg daily. Cholesterol/Stroke/heart attack prevention.   Semaglutide,0.25 or 0.5MG /DOS, (OZEMPIC, 0.25 OR 0.5 MG/DOSE,) 2 MG/3ML SOPN 0.25mg  weekly for 1 month, then increase to 0.5mg  weekly   valsartan-hydrochlorothiazide (DIOVAN-HCT) 160-12.5 MG tablet Take 1 tablet by mouth daily.   [DISCONTINUED] cloNIDine (CATAPRES) 0.2 MG tablet Take  1 tablet (0.2 mg total) by mouth 2 (two) times daily.     Allergies:   Asa [aspirin], Metformin and related, Morphine and codeine, Nitroglycerin, Penicillins, and Sertraline   Social History   Socioeconomic History   Marital status: Married    Spouse name: Not on file   Number of children: Not on file   Years of education: Not on file   Highest education level: Not on file  Occupational History   Not on file  Tobacco Use   Smoking status: Former    Current packs/day: 0.00    Average packs/day: 0.3 packs/day for 5.0 years (1.3 ttl pk-yrs)    Types: Cigarettes    Start date: 10    Quit date: 21    Years since quitting: 52.8   Smokeless tobacco: Former  Building services engineer status: Never Used  Substance and Sexual Activity   Alcohol use: Yes    Alcohol/week: 1.0 standard drink of alcohol    Types: 1 Glasses of wine per week    Comment: occasionally   Drug use: Never   Sexual activity: Not Currently    Birth control/protection: None  Other Topics Concern   Not on file  Social History Narrative   Not on file   Social Determinants of Health   Financial Resource Strain: Medium Risk (01/02/2023)   Overall Financial Resource Strain (CARDIA)    Difficulty of Paying Living Expenses: Somewhat hard  Food Insecurity: Food Insecurity Present (01/02/2023)   Hunger Vital Sign    Worried About Running Out of Food in the Last Year: Sometimes true    Ran Out of Food in the Last Year: Sometimes true  Transportation Needs: No Transportation Needs (01/02/2023)   PRAPARE - Administrator, Civil Service (Medical): No    Lack of Transportation (Non-Medical): No  Physical Activity: Insufficiently Active (01/02/2023)   Exercise Vital Sign    Days of Exercise per Week: 7 days    Minutes of Exercise per Session: 10 min  Stress: No Stress Concern Present (12/26/2022)   [employer] of Occupational Health - Occupational Stress Questionnaire    Feeling of Stress : Only a little   Social Connections: Unknown (01/28/2023)   Received from [facility]   Social Network    Social Network: Not on file  Recent Concern: Social Connections - Moderately Isolated (12/26/2022)   Social Connection and Isolation Panel [NHANES]    Frequency of Communication with Friends and Family: More than three times a week    Frequency of Social Gatherings with Friends and Family: Three times a week    Attends Religious Services: More than 4 times per year    Active Member of Clubs or Organizations: No    Attends Banker Meetings: Never    Marital Status: Separated     Family History: The patient's family history includes Dementia in her sister; Diabetes in her daughter; Hypertension in her brother  and son; Stroke in her father and mother.  ROS:   Please see the history of present illness.     All other systems reviewed and are negative.  EKGs/Labs/Other Studies Reviewed:    The following studies were reviewed today:  N/A  EKG:  EKG is not ordered today.    Recent Labs: 10/18/2022: ALT 14 01/28/2023: Magnesium 1.9 02/19/2023: B Natriuretic Peptide 74.6 07/25/2023: TSH 0.991 08/07/2023: BUN 11; Creatinine, Ser 0.92; Hemoglobin 14.1; Platelets 227; Potassium 3.8; Sodium 139  Recent Lipid Panel    Component Value Date/Time   CHOL 204 (H) 12/28/2021 0933   TRIG 161 (H) 12/28/2021 0933   HDL 38 (L) 12/28/2021 0933   CHOLHDL 5.4 (H) 12/28/2021 0933   LDLCALC 137 (H) 12/28/2021 0933    Physical Exam:    VS:  BP (!) 150/88 (BP Location: Left Arm, Patient Position: Sitting, Cuff Size: Normal)   Pulse 89   Ht 5' 5.5" (1.664 m)   Wt 209 lb 9.6 oz (95.1 kg)   SpO2 93%   BMI 34.35 kg/m     Wt Readings from Last 3 Encounters:  08/14/23 209 lb 9.6 oz (95.1 kg)  08/09/23 209 lb 8 oz (95 kg)  07/25/23 207 lb (93.9 kg)     GEN:  Well nourished, well developed in no acute distress HEENT: Normal NECK: No JVD; No carotid bruits LYMPHATICS: No  lymphadenopathy CARDIAC: RRR, no murmurs, rubs, gallops RESPIRATORY:  Clear to auscultation without rales, wheezing or rhonchi  ABDOMEN: Soft, non-tender, non-distended MUSCULOSKELETAL:  1+ lower leg edema; No deformity  SKIN: Warm and dry NEUROLOGIC:  Alert and oriented x 3 PSYCHIATRIC:  Normal affect   ASSESSMENT:    1. Essential hypertension   2. Lower leg edema   3. Chest pain, unspecified type   4. Murmur    PLAN:    In order of problems listed above:  HTN Recent ER visit for elevated BP with no symptoms reported. SBP 190s in the ER. She is taking valsartan 160mg  daily, hydrochlorothiazide 25mg  daily and clonidine 0.2mg  daily. She has adverse side effects to multiple antihypertensive medications. BP today is mildly elevated, 150/88. I will wean of clonidine with 0.1mg  daily x 3 days, then stop it. I will start Hydralazine 25mg BI, we will likely need to increase this in the future. We will continue Valsartan 160mg  and hydrochlorothiazide 25mg  daily.   LLE She eats a no salt diet. I recommended compression socks and leg elevation. I will check an echo. May need to switch hydrochlorothiazide to lasix as follow-up.   Chest pain Patient reports on episode of chest pain that felt like GERD. I will check an echo as above. She reports remote stress tests that were normal.   Murmur Echo as above.   Disposition: Follow up in 1 month(s) with MD/APP   Signed, Destyni Hoppel David Stall, PA-C  08/14/2023 11:43 AM    [facility]

## 2023-08-17 ENCOUNTER — Encounter: Payer: Self-pay | Admitting: Physician Assistant

## 2023-08-22 ENCOUNTER — Encounter: Payer: Self-pay | Admitting: Physician Assistant

## 2023-08-22 ENCOUNTER — Ambulatory Visit (INDEPENDENT_AMBULATORY_CARE_PROVIDER_SITE_OTHER): Payer: Medicare HMO | Admitting: Physician Assistant

## 2023-08-22 VITALS — BP 169/82 | HR 79 | Temp 97.6°F | Resp 20 | Ht 65.0 in | Wt 206.4 lb

## 2023-08-22 DIAGNOSIS — Z820 Family history of epilepsy and other diseases of the nervous system: Secondary | ICD-10-CM

## 2023-08-22 DIAGNOSIS — J302 Other seasonal allergic rhinitis: Secondary | ICD-10-CM

## 2023-08-22 DIAGNOSIS — F419 Anxiety disorder, unspecified: Secondary | ICD-10-CM

## 2023-08-22 DIAGNOSIS — R413 Other amnesia: Secondary | ICD-10-CM

## 2023-08-22 DIAGNOSIS — F02A0 Dementia in other diseases classified elsewhere, mild, without behavioral disturbance, psychotic disturbance, mood disturbance, and anxiety: Secondary | ICD-10-CM

## 2023-08-22 DIAGNOSIS — I10 Essential (primary) hypertension: Secondary | ICD-10-CM | POA: Diagnosis not present

## 2023-08-22 DIAGNOSIS — Z823 Family history of stroke: Secondary | ICD-10-CM | POA: Diagnosis not present

## 2023-08-22 NOTE — Progress Notes (Unsigned)
Established patient visit  Patient: Vanessa Hogan   DOB: 1941/04/26   82 y.o. Female  MRN: [id number] Visit Date: 08/22/2023  Today's healthcare provider: Debera Lat, PA-C   No chief complaint on file.  Subjective    HPI  *** Discussed the use of AI scribe software for clinical note transcription with the patient, who gave verbal consent to proceed.  History of Present Illness               07/25/2023   10:09 AM 06/26/2023   11:53 AM 04/20/2023    3:00 PM  Depression screen PHQ 2/9  Decreased Interest 1 1 0  Down, Depressed, Hopeless 1 1 0  PHQ - 2 Score 2 2 0  Altered sleeping 1 1 1   Tired, decreased energy 1 2 1   Change in appetite 1 0 0  Feeling bad or failure about yourself  1 0 0  Trouble concentrating 1 1 0  Moving slowly or fidgety/restless 0 0 0  Suicidal thoughts 0 0 0  PHQ-9 Score 7 6 2   Difficult doing work/chores   Not difficult at all      07/25/2023   10:10 AM 06/26/2023   11:52 AM 04/20/2023    3:00 PM 02/27/2023   11:00 AM  GAD 7 : Generalized Anxiety Score  Nervous, Anxious, on Edge 2 2 1 1   Control/stop worrying 2 1 1  0  Worry too much - different things 2 2 1 1   Trouble relaxing 2 2 0 1  Restless 1 0 0 0  Easily annoyed or irritable 1 0 1 0  Afraid - awful might happen 3 2 0 0  Total GAD 7 Score 13 9 4 3   Anxiety Difficulty   Not difficult at all Somewhat difficult    Medications: Outpatient Medications Prior to Visit  Medication Sig   ACCU-CHEK GUIDE test strip 1 each by Other route 3 (three) times daily.   Accu-Chek Softclix Lancets lancets Use as instructed   cloNIDine (CATAPRES) 0.1 MG tablet Take 1 tablet (0.1 mg total) by mouth daily for 3 days.   hydrALAZINE (APRESOLINE) 25 MG tablet Take 1 tablet (25 mg total) by mouth 2 (two) times daily.   hydrochlorothiazide (HYDRODIURIL) 12.5 MG tablet Take 1 tablet (12.5 mg total) by mouth daily. (Patient not taking: Reported on 08/22/2023)   insulin aspart protamine - aspart  (NOVOLOG MIX 70/30 FLEXPEN) (70-30) 100 UNIT/ML FlexPen INJECT 18 UNITS SUBCUTANEOUSLY EVERY MORNING AND 20 UNITS EVERY EVENING; if having any lows, can also consider reducing by up to 20% and then monitor to see if needs to adjust back up   insulin lispro protamine-lispro (HUMALOG 75/25 MIX) (75-25) 100 UNIT/ML SUSP injection Inject into the skin.   Insulin Pen Needle (BD PEN NEEDLE NANO 2ND GEN) 32G X 4 MM MISC 1 each by Does not apply route in the morning, at noon, and at bedtime.   Insulin Pen Needle 32G X 4 MM MISC Use to inject insulin twice daily   LORazepam (ATIVAN) 0.5 MG tablet Take 1 tablet by mouth at bedtime as needed   pantoprazole (PROTONIX) 40 MG tablet TAKE 1 TABLET(40 MG) BY MOUTH DAILY   rosuvastatin (CRESTOR) 40 MG tablet Take 1 tablet (40 mg total) by mouth daily. Increased from 10 mg. Take only 40 mg daily. Cholesterol/Stroke/heart attack prevention.   Semaglutide,0.25 or 0.5MG /DOS, (OZEMPIC, 0.25 OR 0.5 MG/DOSE,) 2 MG/3ML SOPN 0.25mg  weekly for 1 month, then increase to 0.5mg  weekly   valsartan-hydrochlorothiazide (DIOVAN-HCT)  160-12.5 MG tablet Take 1 tablet by mouth daily.   No facility-administered medications prior to visit.    Review of Systems Except see HPI   {Insert previous labs (optional):23779} {See past labs  Heme  Chem  Endocrine  Serology  Results Review (optional):1}   Objective    There were no vitals taken for this visit. {Insert last BP/Wt (optional):23777}{See vitals history (optional):1}   Physical Exam   No results found for any visits on 08/22/23.  Assessment & Plan    *** Assessment and Plan              No follow-ups on file.      [facility]

## 2023-08-24 ENCOUNTER — Encounter: Payer: Self-pay | Admitting: Physician Assistant

## 2023-08-24 ENCOUNTER — Telehealth: Payer: Self-pay | Admitting: *Deleted

## 2023-08-24 NOTE — Progress Notes (Signed)
  Care Coordination   Note   08/24/2023 Name: Vanessa Hogan MRN: [id number] DOB: [birth year]  [name] is a 82 y.o. year old female who sees [place], [place], [place] for primary care. I reached out to [employer] by phone today to offer care coordination services.  [name] was given information about Care Coordination services today including:   The Care Coordination services include support from the care team which includes your Nurse Coordinator, Clinical Social Worker, or Pharmacist.  The Care Coordination team is here to help remove barriers to the health concerns and goals most important to you. Care Coordination services are voluntary, and the patient may decline or stop services at any time by request to their care team member.   Care Coordination Consent Status: Patient agreed to services and verbal consent obtained.   Follow up plan:  Telephone appointment with care coordination team member scheduled for:  11/4  Encounter Outcome:  Patient Scheduled  [facility] Coordination Care Guide  Direct Dial: [phone]

## 2023-08-27 ENCOUNTER — Ambulatory Visit: Payer: Self-pay | Admitting: *Deleted

## 2023-08-27 NOTE — Patient Outreach (Addendum)
  Care Coordination   Initial Visit Note   08/31/2023 Name: Vanessa Hogan MRN: [id number] DOB: 1941/10/04  Vanessa Hogan is a 82 y.o. year old female who sees [place], [place], [place] for primary care. I spoke with  Vanessa Hogan by phone today.  What matters to the patients health and wellness today?  Patient requesting clarification regarding medication re-fill    Goals Addressed             This Visit's Progress    care coodinaiton activities       Interventions Today    Flowsheet Row Most Recent Value  Chronic Disease   Chronic disease during today's visit --  [memory changes]  General Interventions   General Interventions Discussed/Reviewed General Interventions Discussed, Doctor Visits  [needs assessment completed, patient denied need for in home aid support-states that she is able to take care of her own ADL's and IADL's-reports memory deficits are minimal and feels are age appropriate]  Doctor Visits Discussed/Reviewed Doctor Visits Discussed  [patient agreeable to follow up with Neurology-confirmed that provider office has completed the referral]  Exercise Interventions   Exercise Discussed/Reviewed Exercise Discussed  [confirms daily excercise-consisting in stretches,  running  in place, walking]  Mental Health Interventions   Mental Health Discussed/Reviewed Mental Health Discussed, Anxiety, Depression  [mental health needs assessed-patient denied need fo mental health follow up at this time-reports that symptoms of depression often come from "loneliness" local support is minimal]  Pharmacy Interventions   Pharmacy Dicussed/Reviewed Pharmacy Topics Discussed  [pt concerned that her xanax was not re-filled,  patient requesting clarification-reports Ativan is used as PRN only]  Safety Interventions   Safety Discussed/Reviewed Safety Discussed  [patient encouraged to contact law enforcement with any threats of violence]                  SDOH assessments and interventions completed:  Yes  SDOH Interventions Today    Flowsheet Row Most Recent Value  SDOH Interventions   Food Insecurity Interventions Intervention Not Indicated, Other (Comment)  [Has E-card through insurance helps with food and utlities]  Housing Interventions Intervention Not Indicated  Transportation Interventions Intervention Not Indicated  Utilities Interventions Intervention Not Indicated  Social Connections Interventions Intervention Not Indicated        Care Coordination Interventions:  Yes, provided   Follow up plan: Follow up call scheduled for 09/14/23    Encounter Outcome:  Patient Visit Completed

## 2023-08-28 NOTE — Patient Instructions (Signed)
Visit Information  Thank you for taking time to visit with me today. Please don't hesitate to contact me if I can be of assistance to you.   Following are the goals we discussed today:  Please continue to follow up with your provider regarding any questions or concerns regarding your medicines and medical care  Our next appointment is by telephone on 09/13/23 at 11am  Please call the care guide team at [phone] if you need to cancel or reschedule your appointment.   If you are experiencing a Mental Health or Behavioral Health Crisis or need someone to talk to, please call 911   Patient verbalizes understanding of instructions and care plan provided today and agrees to view in MyChart. Active MyChart status and patient understanding of how to access instructions and care plan via MyChart confirmed with patient.     Telephone follow up appointment with care management team member scheduled for: 09/13/23   Vanessa Czech, LCSW [place]  [facility], [facility] Licensed Clinical Social Worker Care Coordinator  Direct Dial: [phone]

## 2023-08-31 ENCOUNTER — Telehealth: Payer: Self-pay | Admitting: *Deleted

## 2023-08-31 NOTE — Patient Outreach (Signed)
  Care Coordination   Follow Up Visit Note   08/31/2023 Name: Vanessa Hogan MRN: [id number] DOB: Aug 14, 1941  Vanessa Hogan is a 82 y.o. year old female who sees [place], [place], [place] for primary care. I spoke with  [name] by phone today.  What matters to the patients health and wellness today?  Clarification regarding medication re-fills    Goals Addressed             This Visit's Progress    care coodination activities       Interventions Today    Flowsheet Row Most Recent Value  Chronic Disease   Chronic disease during today's visit Other  [memory changes]  General Interventions   General Interventions Discussed/Reviewed General Interventions Reviewed  [needs assessment completed-confirmed no additional community resource needs at this time, continues to have questions regardng medication re-fill for ativan-encouraged pt to discuss with her provider during next visit]  Doctor Visits Discussed/Reviewed Doctor Visits Reviewed, PCP  [PCP 09/07/23]                 SDOH assessments and interventions completed:  No     Care Coordination Interventions:  Yes, provided   Follow up plan: No further intervention required.   Encounter Outcome:  Patient Visit Completed

## 2023-08-31 NOTE — Patient Instructions (Signed)
Visit Information  Thank you for taking time to visit with me today. Please don't hesitate to contact me if I can be of assistance to you.   Following are the goals we discussed today:  Please follow up with your provider regarding ay questions regarding your medication re-fills or your medical care   If you are experiencing a Mental Health or Behavioral Health Crisis or need someone to talk to, please call 911   Patient verbalizes understanding of instructions and care plan provided today and agrees to view in MyChart. Active MyChart status and patient understanding of how to access instructions and care plan via MyChart confirmed with patient.     No further follow up required: patient denied having any additional community resource needs [employer], [employer] [place]  [facility], [facility] Licensed Clinical Social Geologist, engineering Dial: [phone]

## 2023-09-04 ENCOUNTER — Ambulatory Visit: Payer: Medicare HMO | Admitting: Medical

## 2023-09-04 ENCOUNTER — Other Ambulatory Visit: Payer: Medicare HMO

## 2023-09-05 ENCOUNTER — Telehealth: Payer: Self-pay

## 2023-09-05 NOTE — Telephone Encounter (Signed)
2 boxes of Ozempic received for the patient. Called and notified patient that medication was ready to be picked up.

## 2023-09-07 ENCOUNTER — Ambulatory Visit (INDEPENDENT_AMBULATORY_CARE_PROVIDER_SITE_OTHER): Payer: Medicare HMO | Admitting: Physician Assistant

## 2023-09-07 ENCOUNTER — Encounter: Payer: Self-pay | Admitting: Physician Assistant

## 2023-09-07 VITALS — BP 162/73 | HR 101 | Resp 16 | Ht 65.0 in | Wt 208.5 lb

## 2023-09-07 DIAGNOSIS — I10 Essential (primary) hypertension: Secondary | ICD-10-CM

## 2023-09-07 DIAGNOSIS — E119 Type 2 diabetes mellitus without complications: Secondary | ICD-10-CM

## 2023-09-07 DIAGNOSIS — E1159 Type 2 diabetes mellitus with other circulatory complications: Secondary | ICD-10-CM | POA: Diagnosis not present

## 2023-09-07 DIAGNOSIS — F419 Anxiety disorder, unspecified: Secondary | ICD-10-CM

## 2023-09-07 DIAGNOSIS — Z794 Long term (current) use of insulin: Secondary | ICD-10-CM | POA: Diagnosis not present

## 2023-09-08 NOTE — Progress Notes (Signed)
Established patient visit  Patient: Vanessa Hogan   DOB: 1940-10-24   82 y.o. Female  MRN: [id number] Visit Date: 09/07/2023  Today's healthcare provider: Debera Lat, PA-C   Chief Complaint  Patient presents with   Medical Management of Chronic Issues    2 week FU on BP. Saw cardiologist and was put on hydralazine-had side effects so she went back to clonidine. She has not talked to the cardio about it. Not sure she has confidence in the cardiologist she sees   Subjective     Discussed the use of AI scribe software for clinical note transcription with the patient, who gave verbal consent to proceed.  History of Present Illness   The patient, with a history of hypertension, reports experiencing heart flutters after taking the medication prescribed by their cardiologist. They also report that the medication clonidine, which they have been taking for blood pressure, is no longer effective and causes headaches. The patient has been taking clonidine and hydrochlorothiazide for blood pressure management. However, they report not receiving valsartan and hydrochlorothiazide from the pharmacy despite it being prescribed. The patient also reports issues with their insulin medication, stating that they have been taking Humalog but would prefer to switch back to Novolog. They report that they have not received Novolog from the pharmacy this year.           09/07/2023   11:09 AM 08/27/2023    9:35 AM 07/25/2023   10:09 AM  Depression screen PHQ 2/9  Decreased Interest 0 0 1  Down, Depressed, Hopeless 0 1 1  PHQ - 2 Score 0 1 2  Altered sleeping 2  1  Tired, decreased energy 1  1  Change in appetite 0  1  Feeling bad or failure about yourself  1  1  Trouble concentrating 1  1  Moving slowly or fidgety/restless 0  0  Suicidal thoughts 0  0  PHQ-9 Score 5  7  Difficult doing work/chores Somewhat difficult        09/07/2023   11:09 AM 07/25/2023   10:10 AM 06/26/2023   11:52 AM  04/20/2023    3:00 PM  GAD 7 : Generalized Anxiety Score  Nervous, Anxious, on Edge 2 2 2 1   Control/stop worrying 1 2 1 1   Worry too much - different things 1 2 2 1   Trouble relaxing 1 2 2  0  Restless 0 1 0 0  Easily annoyed or irritable 0 1 0 1  Afraid - awful might happen 1 3 2  0  Total GAD 7 Score 6 13 9 4   Anxiety Difficulty Somewhat difficult   Not difficult at all    Medications: Outpatient Medications Prior to Visit  Medication Sig   ACCU-CHEK GUIDE test strip 1 each by Other route 3 (three) times daily.   Accu-Chek Softclix Lancets lancets Use as instructed   hydrochlorothiazide (HYDRODIURIL) 12.5 MG tablet Take 1 tablet (12.5 mg total) by mouth daily.   insulin aspart protamine - aspart (NOVOLOG MIX 70/30 FLEXPEN) (70-30) 100 UNIT/ML FlexPen INJECT 18 UNITS SUBCUTANEOUSLY EVERY MORNING AND 20 UNITS EVERY EVENING; if having any lows, can also consider reducing by up to 20% and then monitor to see if needs to adjust back up   insulin lispro protamine-lispro (HUMALOG 75/25 MIX) (75-25) 100 UNIT/ML SUSP injection Inject into the skin.   Insulin Pen Needle (BD PEN NEEDLE NANO 2ND GEN) 32G X 4 MM MISC 1 each by Does not apply route in the morning,  at noon, and at bedtime.   Insulin Pen Needle 32G X 4 MM MISC Use to inject insulin twice daily   pantoprazole (PROTONIX) 40 MG tablet TAKE 1 TABLET(40 MG) BY MOUTH DAILY   Semaglutide,0.25 or 0.5MG /DOS, (OZEMPIC, 0.25 OR 0.5 MG/DOSE,) 2 MG/3ML SOPN 0.25mg  weekly for 1 month, then increase to 0.5mg  weekly   valsartan-hydrochlorothiazide (DIOVAN-HCT) 160-12.5 MG tablet Take 1 tablet by mouth daily.   cloNIDine (CATAPRES) 0.1 MG tablet Take 1 tablet (0.1 mg total) by mouth daily for 3 days.   LORazepam (ATIVAN) 0.5 MG tablet Take 1 tablet by mouth at bedtime as needed (Patient not taking: Reported on 09/07/2023)   [DISCONTINUED] hydrALAZINE (APRESOLINE) 25 MG tablet Take 1 tablet (25 mg total) by mouth 2 (two) times daily. (Patient not taking:  Reported on 09/07/2023)   [DISCONTINUED] rosuvastatin (CRESTOR) 40 MG tablet Take 1 tablet (40 mg total) by mouth daily. Increased from 10 mg. Take only 40 mg daily. Cholesterol/Stroke/heart attack prevention.   No facility-administered medications prior to visit.    Review of Systems  All other systems reviewed and are negative.  Except see HPI       Objective    BP (!) 162/73   Pulse (!) 101   Resp 16   Ht 5\' 5"  (1.651 m)   Wt 208 lb 8 oz (94.6 kg)   SpO2 98%   BMI 34.70 kg/m     Physical Exam Constitutional:      General: She is not in acute distress.    Appearance: Normal appearance.  HENT:     Head: Normocephalic.  Pulmonary:     Effort: Pulmonary effort is normal. No respiratory distress.  Neurological:     Mental Status: She is alert and oriented to person, place, and time. Mental status is at baseline.      No results found for any visits on 09/07/23.  Assessment & Plan      Hypertension Elevated blood pressure readings. Patient reported adverse effects with hydrochlorothiazide and clonidine, including heart flutters and headaches. Patient has been taking clonidine 0.1 mcg and valsartan/hydrochlorothiazide 160/12.5 mg, but reports not receiving valsartan/hydrochlorothiazide? -[facility] -Consider alternative antihypertensive medications. Pt declined at this moment Advised to be reassessed next week if BP stays above 170  Diabetes Mellitus Patient reports taking insulin daily, but is running out of Humalog. Patient expressed preference for NovoLog due to perceived decreased effectiveness of Humalog towards the end of the pen. -[facility]. There is a Rx of Novolog/for 12 mo -Continue Humalog until NovoLog availability is confirmed. Pt is planning to clarify with pharmacy Advised to contact us back If a new Rx should be placed.  Anxiety Patient expressed  dissatisfaction with previous discussion regarding lorazepam and escitalopram. Patient has researched and has concerns about potential side effects of escitalopram, particularly memory issues. However, per clinical key research for the safest antidepressants for pt's age, escitalopram is listed as a good option. Planned to discuss a min dose of escitalopram for side effects management but pt declined. Per further review of clinical key, the following information was gathered: Escitalopram is generally safer than lorazepam for elderly patients.  Escitalopram, an SSRI, is often preferred for treating depression and anxiety in the elderly due to a more favorable side effect profile compared to other antidepressants.  Lorazepam, a benzodiazepine, is associated with a higher risk of adverse effects in the elderly, including cognitive impairment, delirium, falls, and fractures.  The Beers Criteria recommends avoiding benzodiazepines like lorazepam in elderly patients, especially those with a history of falls or cognitive impairment -Consider alternative anxiolytic medications. -Refer to new primary care provider, Dr. Roxan Hockey, for further management.  General Health Maintenance / Followup Plans -Complete pending blood work/from 06/2023 when fasting. -Schedule follow-up appointment with Dr. Roxan Hockey, preferably after blood work results are available.      Return for next week or in December of 2024, chronic disease f/u Dr. Roxan Hockey.     The patient was advised to call back or seek an in-person evaluation if the symptoms worsen or if the condition fails to improve as anticipated.  I discussed the assessment and treatment plan with the patient. The patient was provided an opportunity to ask questions and all were answered. The patient agreed with the plan and demonstrated an understanding of the instructions.  I, Debera Lat, PA-C have reviewed all documentation for this visit. The documentation on   09/07/23 for the exam, diagnosis, procedures, and orders are all accurate and complete.  Debera Lat, [facility], MMS [facility] [phone] (phone) [phone] (fax)  [facility]

## 2023-09-08 NOTE — Addendum Note (Signed)
Addended by: Debera Lat on: 09/08/2023 11:40 PM   Modules accepted: Level of Service

## 2023-09-13 ENCOUNTER — Ambulatory Visit: Payer: Medicare HMO | Admitting: Medical

## 2023-09-14 ENCOUNTER — Ambulatory Visit: Payer: Self-pay | Admitting: *Deleted

## 2023-09-14 NOTE — Patient Instructions (Signed)
Visit Information  Thank you for taking time to visit with me today. Please don't hesitate to contact me if I can be of assistance to you.   Following are the goals we discussed today:  Please contact this social worker with any additional community resource needs or concerns  If you are experiencing a Mental Health or Behavioral Health Crisis or need someone to talk to, please call 911   Patient verbalizes understanding of instructions and care plan provided today and agrees to view in MyChart. Active MyChart status and patient understanding of how to access instructions and care plan via MyChart confirmed with patient.     No further follow up required: Patient to contact this Child psychotherapist with any additional community resource needs  [employer], [employer] [place]  [facility], [facility] Licensed Clinical Social Geologist, engineering Dial: [phone]
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
## 2023-10-03 ENCOUNTER — Ambulatory Visit: Payer: Medicare HMO | Admitting: Medical

## 2023-10-05 ENCOUNTER — Ambulatory Visit: Payer: Self-pay

## 2023-10-05 ENCOUNTER — Other Ambulatory Visit: Payer: Self-pay | Admitting: Physician Assistant

## 2023-10-05 DIAGNOSIS — F515 Nightmare disorder: Secondary | ICD-10-CM

## 2023-10-05 NOTE — Telephone Encounter (Signed)
Medication Refill -  Most Recent Primary Care Visit:  Provider: Debera Lat  Department: [facility]  Visit Type: OFFICE VISIT  Date: 09/07/2023  Medication:  LORazepam (ATIVAN) 0.5 MG tablet  *Patient is completely out, per [place] with [employer], [place] brought patient over & patient reported having symptoms, transferred to NT per symptoms.   Has the patient contacted their pharmacy? Yes, advised to contact PCP. [place] w/ [facility] has called to request this refill for patient.  Is this the correct pharmacy for this prescription? Yes, the one listed below  This is the patient's preferred pharmacy:  [id number]  [place], [place] - [address] Phone: [phone] Fax: [phone]  Has the prescription been filled recently?  Start date on 08/09/2023  Is the patient out of the medication? Yes.  Has the patient been seen for an appointment in the last year OR does the patient have an upcoming appointment? Patient has a F/U appt with PCP on 10/08/2023

## 2023-10-05 NOTE — Telephone Encounter (Signed)
Requested medication (s) are due for refill today:Yes  Requested medication (s) are on the active medication list: Yes  Last refill:  08/09/23  Future visit scheduled: Yes  Notes to clinic:  Unable to refill per protocol, cannot delegate.      Requested Prescriptions  Pending Prescriptions Disp Refills   LORazepam (ATIVAN) 0.5 MG tablet 20 tablet 0    Sig: Take 1 tablet by mouth at bedtime as needed     Not Delegated - Psychiatry: Anxiolytics/Hypnotics 2 Failed - 10/05/2023 12:21 PM      Failed - This refill cannot be delegated      Failed - Urine Drug Screen completed in last 360 days      Passed - Patient is not pregnant      Passed - Valid encounter within last 6 months    Recent Outpatient Visits           4 weeks ago Primary hypertension   [facility] [place], [place], PA-C   1 month ago Primary hypertension   [facility] [place], [place], PA-C   1 month ago Hospital discharge follow-up   [facility] [place], [place], PA-C   2 months ago Annual physical exam   [place] [facility] [place], [place], PA-C   3 months ago Hypertension associated with diabetes [facility])   [facility] [place], [place], PA-C       Future Appointments             In 3 days Debera Lat, PA-C [facility] [employer] [facility], [facility]   In 1 month Furth, Cadence H, PA-C [employer] [facility] [place]

## 2023-10-05 NOTE — Telephone Encounter (Signed)
  Chief Complaint: medication refill  Symptoms: no sx Frequency: 1 month or more been out of medication Pertinent Negatives: NA Disposition: [] ED /[] Urgent Care (no appt availability in office) / [] Appointment(In office/virtual)/ []  [facility]/ [] Home Care/ [] Refused Recommended Disposition /[] [place] [facility]/ [x]  Follow-up with PCP Additional Notes: pt and Insurance company calling in for refill on Lorazepam. Pt states she hasn't taken in 1 month or more d/t not being refilled. She has been drinking small amount of alcohol at night with orange juice to help her calm down and go to sleep. Pt states she is not drinking for intoxication and does not mix with other drugs. Pt states on OV 07/25/23 Edmon Crape, PA had discussed alternatives to lorazepam d/t it causing cognitive issues, alternates were discussed but pt researched and found that she doesn't have any SE of taking the Lorazepam and would like to continue it. [facility] Advised pt I would send message to provider letting her know our discussion and see if rx can be refilled since last fill was "print on 08/09/23 for #20/0. Pt states she never received paper script.    Reason for Disposition  Caller requesting a CONTROLLED substance prescription refill (e.g., narcotics, ADHD medicines)  Answer Assessment - Initial Assessment Questions 1. DRUG NAME: "What medicine do you need to have refilled?"     Lorazepam 0.5 MG  2. REFILLS REMAINING: "How many refills are remaining?" (Note: The label on the medicine or pill bottle will show how many refills are remaining. If there are no refills remaining, then a renewal may be needed.)     0 4. PRESCRIBING HCP: "Who prescribed it?" Reason: If prescribed by specialist, call should be referred to that group.     Edmon Crape, PA 5. SYMPTOMS: "Do you have any symptoms?"     No sx, hasn't taken medication in approx 1 month d/t being out, last fill was "print" #20 on  08/09/23  Protocols used: Medication Refill and Renewal Call-A-AH

## 2023-10-08 ENCOUNTER — Ambulatory Visit (INDEPENDENT_AMBULATORY_CARE_PROVIDER_SITE_OTHER): Payer: Medicare HMO | Admitting: Physician Assistant

## 2023-10-08 VITALS — BP 128/78 | HR 69 | Ht 65.5 in | Wt 206.0 lb

## 2023-10-08 DIAGNOSIS — I10 Essential (primary) hypertension: Secondary | ICD-10-CM | POA: Diagnosis not present

## 2023-10-08 DIAGNOSIS — R413 Other amnesia: Secondary | ICD-10-CM

## 2023-10-08 DIAGNOSIS — E785 Hyperlipidemia, unspecified: Secondary | ICD-10-CM | POA: Diagnosis not present

## 2023-10-08 DIAGNOSIS — E119 Type 2 diabetes mellitus without complications: Secondary | ICD-10-CM | POA: Diagnosis not present

## 2023-10-08 DIAGNOSIS — E1169 Type 2 diabetes mellitus with other specified complication: Secondary | ICD-10-CM

## 2023-10-08 DIAGNOSIS — N898 Other specified noninflammatory disorders of vagina: Secondary | ICD-10-CM | POA: Diagnosis not present

## 2023-10-08 DIAGNOSIS — R300 Dysuria: Secondary | ICD-10-CM | POA: Diagnosis not present

## 2023-10-08 DIAGNOSIS — F419 Anxiety disorder, unspecified: Secondary | ICD-10-CM

## 2023-10-08 LAB — POCT URINALYSIS DIPSTICK
Bilirubin, UA: NEGATIVE
Glucose, UA: NEGATIVE
Ketones, UA: NEGATIVE
Nitrite, UA: NEGATIVE
Protein, UA: NEGATIVE
Spec Grav, UA: 1.02 (ref 1.010–1.025)
Urobilinogen, UA: NEGATIVE U/dL — AB
pH, UA: 7 (ref 5.0–8.0)

## 2023-10-08 MED ORDER — DULOXETINE HCL 20 MG PO CPEP
20.0000 mg | ORAL_CAPSULE | Freq: Every day | ORAL | 3 refills | Status: AC
Start: 2023-10-08 — End: ?

## 2023-10-09 ENCOUNTER — Encounter: Payer: Self-pay | Admitting: Physician Assistant

## 2023-10-09 ENCOUNTER — Telehealth: Payer: Self-pay

## 2023-10-09 NOTE — Telephone Encounter (Signed)
Patient was seen yesterday, A cervicalvaginal swab was ordered but not done.  [facility] received paperwork on  patient but no swab.  It has been canceled.  Do you want patient to come back in for a swab? Or just see what urine culture shows?

## 2023-10-09 NOTE — Progress Notes (Signed)
Established patient visit  Patient: Vanessa Hogan   DOB: 1941-07-31   82 y.o. Female  MRN: [id number] Visit Date: 10/08/2023  Today's healthcare provider: Debera Lat, PA-C   Chief Complaint  Patient presents with   Follow-up   Subjective     HPI   Pt stated--B/p still elavated and never pick-up the medication. Discharge-white thick w/ odor--2 weeks. Declined pain/fever. Last edited by Shelly Bombard, CMA on 10/08/2023 10:20 AM.       Discussed the use of AI scribe software for clinical note transcription with the patient, who gave verbal consent to proceed.  History of Present Illness   The patient, with a history of hypertension and diabetes, presents with a urinary tract infection (UTI) and vaginal discharge. The discharge is described as whitish with some odor. The patient has been experiencing fluctuating blood pressure at home, despite being on metoprolol. She stopped taking Hisar due to heart fluttering. The patient has a history of anxiety and is open to trying new medication. She has previously tried a medication that did not work, but does not recall the name. The patient follows a special diet due to her diabetes and reports having regular bowel movements.           10/08/2023   10:06 AM 09/07/2023   11:09 AM 08/27/2023    9:35 AM  Depression screen PHQ 2/9  Decreased Interest 0 0 0  Down, Depressed, Hopeless 0 0 1  PHQ - 2 Score 0 0 1  Altered sleeping 2 2   Tired, decreased energy 1 1   Change in appetite 1 0   Feeling bad or failure about yourself  0 1   Trouble concentrating 1 1   Moving slowly or fidgety/restless 0 0   Suicidal thoughts 0 0   PHQ-9 Score 5 5   Difficult doing work/chores Somewhat difficult Somewhat difficult       10/08/2023   10:06 AM 09/07/2023   11:09 AM 07/25/2023   10:10 AM 06/26/2023   11:52 AM  GAD 7 : Generalized Anxiety Score  Nervous, Anxious, on Edge 1 2 2 2   Control/stop worrying 1 1 2 1   Worry too much -  different things 1 1 2 2   Trouble relaxing 1 1 2 2   Restless 0 0 1 0  Easily annoyed or irritable 0 0 1 0  Afraid - awful might happen 1 1 3 2   Total GAD 7 Score 5 6 13 9   Anxiety Difficulty Somewhat difficult Somewhat difficult      Medications: Outpatient Medications Prior to Visit  Medication Sig   ACCU-CHEK GUIDE test strip 1 each by Other route 3 (three) times daily.   Accu-Chek Softclix Lancets lancets Use as instructed   insulin aspart protamine - aspart (NOVOLOG MIX 70/30 FLEXPEN) (70-30) 100 UNIT/ML FlexPen INJECT 18 UNITS SUBCUTANEOUSLY EVERY MORNING AND 20 UNITS EVERY EVENING; if having any lows, can also consider reducing by up to 20% and then monitor to see if needs to adjust back up   Insulin Pen Needle (BD PEN NEEDLE NANO 2ND GEN) 32G X 4 MM MISC 1 each by Does not apply route in the morning, at noon, and at bedtime.   Insulin Pen Needle 32G X 4 MM MISC Use to inject insulin twice daily   LORazepam (ATIVAN) 0.5 MG tablet Take 1 tablet by mouth at bedtime as needed   pantoprazole (PROTONIX) 40 MG tablet TAKE 1 TABLET(40 MG) BY MOUTH DAILY   Semaglutide,0.25  or 0.5MG /DOS, (OZEMPIC, 0.25 OR 0.5 MG/DOSE,) 2 MG/3ML SOPN 0.25mg  weekly for 1 month, then increase to 0.5mg  weekly   valsartan-hydrochlorothiazide (DIOVAN-HCT) 160-12.5 MG tablet Take 1 tablet by mouth daily.   [DISCONTINUED] insulin lispro protamine-lispro (HUMALOG 75/25 MIX) (75-25) 100 UNIT/ML SUSP injection Inject into the skin.   cloNIDine (CATAPRES) 0.1 MG tablet Take 1 tablet (0.1 mg total) by mouth daily for 3 days.   hydrochlorothiazide (HYDRODIURIL) 12.5 MG tablet Take 1 tablet (12.5 mg total) by mouth daily. (Patient not taking: Reported on 10/08/2023)   No facility-administered medications prior to visit.    Review of Systems All negative Except see HPI       Objective    BP 128/78 (BP Location: Left Arm, Patient Position: Sitting, Cuff Size: Large)   Pulse 69   Ht 5' 5.5" (1.664 m)   Wt 206 lb  (93.4 kg)   BMI 33.76 kg/m     Physical Exam Vitals reviewed.  Constitutional:      General: She is not in acute distress.    Appearance: Normal appearance. She is well-developed. She is obese. She is not diaphoretic.  HENT:     Head: Normocephalic and atraumatic.  Eyes:     General: No scleral icterus.    Conjunctiva/sclera: Conjunctivae normal.  Neck:     Thyroid: No thyromegaly.  Cardiovascular:     Rate and Rhythm: Normal rate and regular rhythm.     Pulses: Normal pulses.     Heart sounds: Normal heart sounds. No murmur heard. Pulmonary:     Effort: Pulmonary effort is normal. No respiratory distress.     Breath sounds: Normal breath sounds. No wheezing, rhonchi or rales.  Musculoskeletal:     Cervical back: Neck supple.     Right lower leg: No edema.     Left lower leg: No edema.  Lymphadenopathy:     Cervical: No cervical adenopathy.  Skin:    General: Skin is warm and dry.     Findings: No rash.  Neurological:     Mental Status: She is alert and oriented to person, place, and time. Mental status is at baseline.  Psychiatric:        Mood and Affect: Mood normal.        Behavior: Behavior normal.      Results for orders placed or performed in visit on 10/08/23  POCT urinalysis dipstick  Result Value Ref Range   Color, UA yellow    Clarity, UA clear    Glucose, UA Negative Negative   Bilirubin, UA neg    Ketones, UA neg    Spec Grav, UA 1.020 1.010 - 1.025   Blood, UA trace    pH, UA 7.0 5.0 - 8.0   Protein, UA Negative Negative   Urobilinogen, UA negative (A) 0.2 or 1.0 E.U./dL   Nitrite, UA neg    Leukocytes, UA Large (3+) (A) Negative   Appearance     Odor          Assessment and Plan    Urinary Tract Infection Suspected UTI with vaginal discharge. -Order urine culture. -Perform vaginal swab for gonorrhea, chlamydia, trichomoniasis, bacterial vaginosis, and yeast infection.  Hypertension Chronic and bp was 128/78 Fluctuating blood  pressure at home. Patient has stopped taking Clonidine and Hydralazine as she reported side effects. On 11.15.24 Continue taking Diovan-HCT 160-12.5  -Continue monitoring blood pressure at home, low salt diet  -Consult with a pharmacist to review and optimize medication regimen.  Hyperlipidemia  Prescribed Crestor 20mg  and Zetia 10mg , but only one medication was received from the pharmacy. Will check previous note for assistance -[facility] Orders for lipids, A1c from 06/2023 still pending  Anxiety Chronic    10/08/2023   10:06 AM 09/07/2023   11:09 AM 07/25/2023   10:10 AM 06/26/2023   11:52 AM  GAD 7 : Generalized Anxiety Score  Nervous, Anxious, on Edge 1 2 2 2   Control/stop worrying 1 1 2 1   Worry too much - different things 1 1 2 2   Trouble relaxing 1 1 2 2   Restless 0 0 1 0  Easily annoyed or irritable 0 0 1 0  Afraid - awful might happen 1 1 3 2   Total GAD 7 Score 5 6 13 9   Anxiety Difficulty Somewhat difficult Somewhat difficult    Despite GAD7 score of 5, pt states that her symptoms worsened. She requested a medication. Patient has been taking Lorazepam due to cognitive side effects but is open to trying new medication for better memory preservation. -Start Duloxetine at a small dose, monitor for side effects and efficacy. She does not remember that she has ever taken this medication. She agreed to proceed with Duloxetine.  General Health Maintenance / Followup Plans -Schedule appointment with a pharmacologist within 7 days. -Schedule physical exam with Dr. Roxan Hockey in one month or up to Pcp 's discretion. -Check blood work and urine test. -Continue diabetes diet, consider adding a third smaller meal per day. -Follow up in one month.     Memory changes Pat agreed to proceed with a referral to neurology Per Crystal Land's notes  Of note, pt was advised to schedule her appointment with Dr. Roxan Hockey for today. See the note from  09/07/23 Orders Placed This Encounter  Procedures   Urine Culture   AMB Referral VBCI Care Management    Referral Priority:   Routine    Referral Type:   Consultation    Referral Reason:   Care Coordination    Number of Visits Requested:   1   POCT urinalysis dipstick    Return for CPE with Dr. Roxan Hockey, CPE.   The patient was advised to call back or seek an in-person evaluation if the symptoms worsen or if the condition fails to improve as anticipated.  I discussed the assessment and treatment plan with the patient. The patient was provided an opportunity to ask questions and all were answered. The patient agreed with the plan and demonstrated an understanding of the instructions.  I, Debera Lat, PA-C have reviewed all documentation for this visit. The documentation on 10/08/2023  for the exam, diagnosis, procedures, and orders are all accurate and complete.  Debera Lat, [facility], MMS [facility] [phone] (phone) [phone] (fax)  [facility]

## 2023-10-09 NOTE — Telephone Encounter (Signed)
[facility] called back order canceled will call patient for re swab

## 2023-10-09 NOTE — Telephone Encounter (Signed)
Please advise lab on testing   Copied from CRM [id number]. Topic: General - Other >> Oct 09, 2023  8:37 AM Turkey B wrote: Reason for CRM: marion from [facility], states she is missing a specimment. Please cb

## 2023-10-10 ENCOUNTER — Telehealth: Payer: Self-pay

## 2023-10-10 NOTE — Telephone Encounter (Signed)
Can you send to [facility] Bastos, if specimen was not returned or received she will need to come back for repeat.

## 2023-10-10 NOTE — Progress Notes (Signed)
Care Guide Pharmacy Note  10/10/2023 Name: Vanessa Hogan MRN: [id number] DOB: 13-Aug-1941  Referred By: Ronnald Ramp, MD Reason for referral: Care Coordination (Outreach to schedule with Pharm d )   Vanessa Hogan is a 82 y.o. year old female who is a primary care patient of Simmons-Robinson, Tawanna Cooler, MD.  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 The patient agreed to meet with the pharmacist via telephone visit on (date/time).10/25/2023  Penne Lash , RMA     [facility]  [facility], [facility] Guide  Direct Dial: [phone]  Website: [email or web address]

## 2023-10-11 NOTE — Telephone Encounter (Signed)
Spoke  to pt --will come to the office tomorrow to get re-swab (orange tube).

## 2023-10-12 ENCOUNTER — Other Ambulatory Visit: Payer: Self-pay

## 2023-10-12 ENCOUNTER — Other Ambulatory Visit (HOSPITAL_COMMUNITY)
Admission: RE | Admit: 2023-10-12 | Discharge: 2023-10-12 | Disposition: A | Discharge status: Home / Self Care | Payer: Medicare HMO | Source: Ambulatory Visit | Attending: Physician Assistant | Admitting: Physician Assistant

## 2023-10-12 DIAGNOSIS — N898 Other specified noninflammatory disorders of vagina: Secondary | ICD-10-CM

## 2023-10-15 ENCOUNTER — Other Ambulatory Visit: Payer: Self-pay | Admitting: Physician Assistant

## 2023-10-16 LAB — CERVICOVAGINAL ANCILLARY ONLY
Comment: NEGATIVE
Comment: NEGATIVE
Comment: NEGATIVE
Comment: NEGATIVE
Comment: NEGATIVE
Comment: NORMAL

## 2023-10-18 NOTE — Progress Notes (Signed)
Please, let pt know that her urine culture is negative for UTI but an urine sample was contaminated.As for an evaluation of vaginal swab, there was no sufficient quantity to adequately perform test. If she will experience urinary symptoms, pt advised to RTC.

## 2023-10-19 LAB — URINE CULTURE

## 2023-10-19 LAB — SPECIMEN STATUS REPORT

## 2023-10-25 ENCOUNTER — Other Ambulatory Visit: Payer: Self-pay | Admitting: Pharmacist

## 2023-10-25 NOTE — Progress Notes (Signed)
 10/25/2023 Name: Vanessa Hogan MRN: [id number] DOB: February 13, 1941  Chief Complaint  Patient presents with   Medication Management    Vanessa Hogan is a 83 y.o. year old female who presented for a telephone visit.   They were referred to the pharmacist by their PCP for assistance in managing hypertension.    Subjective:  Care Team: Primary Care Provider: Sharma Coyer, MD ; Next Scheduled Visit: 11/08/23 Clinical Pharmacist: Aloysius Breeding, PharmD  Medication Access/Adherence  [phone] [address], [place] - [address] [place] [address] Phone: [phone] Fax: [phone]   Patient reports affordability concerns with their medications: No  Patient reports access/transportation concerns to their pharmacy: No  Patient reports adherence concerns with their medications:  No  - has a system where bottles she is taking are placed in a separate container   Hypertension:  Current medications: Clonidine  0.2mg  twice a day, Valsartan -hydrochlorothiazide  160-12.5mg  half tablet twice a day Medications previously tried: Hydralazine  (heart flutter + headaches), Hydrochlorothiazide  (dizzy at higher dose)  Patient has a validated, automated, upper arm home BP cuff Current blood pressure readings readings: 148/78, 142/85, 137/79, 138/82, 128/69 HR: 67, 68, 75, 71, 65  Patient denies hypotensive s/sx including dizziness, lightheadedness.  Patient denies hypertensive symptoms including headache, chest pain, shortness of breath  Aware to try to minimize salt intake  BG readings (twice a day readings): 120, 100, 151, 142, 162, 120, 163, 122, 140, 107, 168 (pizza), 152 (cold medicine), 160, 123, 158, 114, 201 (Chinese soup), 136, 165, 131, 173, 119, 152, 139, 131, 140, 160, 122, 190 (ice cream), 135, 134    Objective:  Lab Results  Component Value Date   HGBA1C 7.4 (H) 12/28/2022    Lab Results   Component Value Date   CREATININE 0.92 08/07/2023   BUN 11 08/07/2023   NA 139 08/07/2023   K 3.8 08/07/2023   CL 101 08/07/2023   CO2 28 08/07/2023    Lab Results  Component Value Date   CHOL 204 (H) 12/28/2021   HDL 38 (L) 12/28/2021   LDLCALC 137 (H) 12/28/2021   TRIG 161 (H) 12/28/2021   CHOLHDL 5.4 (H) 12/28/2021    Medications Reviewed Today   Medications were not reviewed in this encounter       Assessment/Plan:   Hypertension: - Currently controlled - Reviewed long term cardiovascular and renal outcomes of uncontrolled blood pressure - Reviewed appropriate blood pressure monitoring technique and reviewed goal blood pressure. Recommended to check home blood pressure and heart rate daily - Recommend to continue BP medications as recommended    For anxiety concerns: - Does NOT want to take Duloxetine  due to potential of increased BP while taking - Lorazepam  was for monthly anxiety issues- worked well and wants a refill - Currently using alcohol with orange juice to resolve concerns - Felt poorly on sertraline - Dr. Dineen discussed Escitalopram with her - Hydroxyzine or Buspirone  may be a better option as she prefers to take as-needed for anxiety attacks/issues monthly   Follow Up Plan:  - No follow-up needed at this time - Patient is taking medications as prescribed and closely monitoring both BG and BP- both seem controlled - Aware of how diet affects both chronic conditions - Has all of her medications currently - Completed online application for Ozempic  0.25mg  weekly- will send notification to PCP to sign her portion   Aloysius Breeding, PharmD [facility] Phone Number: [phone]

## 2023-10-29 ENCOUNTER — Ambulatory Visit: Payer: Medicare HMO | Attending: Medical

## 2023-10-29 DIAGNOSIS — R079 Chest pain, unspecified: Secondary | ICD-10-CM | POA: Diagnosis not present

## 2023-10-29 DIAGNOSIS — R011 Cardiac murmur, unspecified: Secondary | ICD-10-CM

## 2023-10-29 LAB — ECHOCARDIOGRAM COMPLETE: Area-P 1/2: 3.03 cm2

## 2023-11-04 ENCOUNTER — Other Ambulatory Visit: Payer: Self-pay | Admitting: Physician Assistant

## 2023-11-04 DIAGNOSIS — F419 Anxiety disorder, unspecified: Secondary | ICD-10-CM

## 2023-11-06 ENCOUNTER — Other Ambulatory Visit: Payer: Self-pay | Admitting: Physician Assistant

## 2023-11-06 DIAGNOSIS — E119 Type 2 diabetes mellitus without complications: Secondary | ICD-10-CM

## 2023-11-06 NOTE — Telephone Encounter (Signed)
 Requested Prescriptions  Refused Prescriptions Disp Refills   DULoxetine  (CYMBALTA ) 20 MG capsule [Pharmacy Med Name: DULOXETINE  HCL DR 20 MG CAP] 90 capsule 2    Sig: TAKE 1 CAPSULE BY MOUTH EVERY DAY     Psychiatry: Antidepressants - SNRI - duloxetine  Passed - 11/06/2023 11:43 AM      Passed - Cr in normal range and within 360 days    Creatinine, Ser  Date Value Ref Range Status  08/07/2023 0.92 0.44 - 1.00 mg/dL Final         Passed - eGFR is 30 or above and within 360 days    GFR, Estimated  Date Value Ref Range Status  08/07/2023 >60 >60 mL/min Final    Comment:    (NOTE) Calculated using the CKD-EPI Creatinine Equation (2021)    eGFR  Date Value Ref Range Status  11/29/2022 66 >59 mL/min/1.73 Final         Passed - Completed PHQ-2 or PHQ-9 in the last 360 days      Passed - Last BP in normal range    BP Readings from Last 1 Encounters:  10/08/23 128/78         Passed - Valid encounter within last 6 months    Recent Outpatient Visits           4 weeks ago Dysuria   [facility] [place], [place], PA-C   2 months ago Primary hypertension   [facility] [place], [place], PA-C   2 months ago Primary hypertension   [place] [facility] [place], [place], PA-C   2 months ago Hospital discharge follow-up   [facility] [place], [place], PA-C   3 months ago Annual physical exam   [facility] [place], [place], PA-C       Future Appointments             Tomorrow Furth, Cadence H, PA-C [facility]   In 8 months Ostwalt, Janna, PA-C [facility]

## 2023-11-06 NOTE — Telephone Encounter (Signed)
 Requested Prescriptions  Pending Prescriptions Disp Refills   pantoprazole  (PROTONIX ) 40 MG tablet [Pharmacy Med Name: PANTOPRAZOLE  40MG  TABLETS] 90 tablet 0    Sig: TAKE 1 TABLET(40 MG) BY MOUTH DAILY     Gastroenterology: Proton Pump Inhibitors Passed - 11/06/2023 11:15 AM      Passed - Valid encounter within last 12 months    Recent Outpatient Visits           4 weeks ago Dysuria   [facility] [place], [place], PA-C   2 months ago Primary hypertension   [place] [facility] [facility] [place], [place], PA-C   2 months ago Primary hypertension   [place] [facility] [place], [place], PA-C   2 months ago Hospital discharge follow-up   [facility] [place], [place], PA-C   3 months ago Annual physical exam   [facility] [place], [place], PA-C       Future Appointments             Tomorrow Furth, Cadence H, PA-C [facility] [place]   In 8 months Ostwalt, Janna, PA-C [facility]

## 2023-11-06 NOTE — Telephone Encounter (Signed)
**Note De-identified  [place] Obfuscation** Please advise 

## 2023-11-07 ENCOUNTER — Encounter: Payer: Self-pay | Admitting: Medical

## 2023-11-07 ENCOUNTER — Ambulatory Visit: Payer: Medicare HMO | Attending: Medical | Admitting: Medical

## 2023-11-07 VITALS — BP 140/76 | HR 81 | Ht 66.0 in | Wt 211.1 lb

## 2023-11-07 DIAGNOSIS — R079 Chest pain, unspecified: Secondary | ICD-10-CM | POA: Diagnosis not present

## 2023-11-07 DIAGNOSIS — Z79899 Other long term (current) drug therapy: Secondary | ICD-10-CM | POA: Diagnosis not present

## 2023-11-07 DIAGNOSIS — I10 Essential (primary) hypertension: Secondary | ICD-10-CM | POA: Diagnosis not present

## 2023-11-07 DIAGNOSIS — I5032 Chronic diastolic (congestive) heart failure: Secondary | ICD-10-CM | POA: Diagnosis not present

## 2023-11-07 DIAGNOSIS — R600 Localized edema: Secondary | ICD-10-CM | POA: Diagnosis not present

## 2023-11-07 MED ORDER — VALSARTAN 160 MG PO TABS: 160.0000 mg | ORAL_TABLET | Freq: Two times a day (BID) | ORAL | 3 refills | Status: AC

## 2023-11-07 MED ORDER — FUROSEMIDE 20 MG PO TABS
20.0000 mg | ORAL_TABLET | Freq: Every day | ORAL | 3 refills | Status: AC
Start: 2023-11-07 — End: 2024-02-05

## 2023-11-07 NOTE — Patient Instructions (Signed)
 Medication Instructions:  Your physician recommends the following medication changes.  STOP TAKING: Diovan - HCT 160-12.5 MG twice daily.  START TAKING: Valsartan  160 MG twice daily. Lasix  20 MG daily.     *If you need a refill on your cardiac medications before your next appointment, please call your pharmacy*   Lab Work: Your provider would like for you to return in 2 weeks to have the following labs drawn: BMP.   Please go to [facility] [address], [place] [address] You do not need an appointment.  They are open from 8 am- 4:30 pm.  Lunch from 1:00 pm- 2:00 pm You will need to be fasting.   You may also go to one of the following [facility]:  [facility] [employer] [id number] [facility] [address], [address] [place], [place] [address], [place] , [place] [address] Phone: [phone]: [phone]: [phone] Lab hours: Mon-Fri 8 am- 5 pm Lab Hours: Mon-Fri 8 am- 5 pm   Lab Hours: Mon-Fri 8 am- 5 pm Lunch 12 pm- 1 pmLunch 12 pm- 1 pmLunch: 12 pm- 1 pm    If you have labs (blood work) drawn today and your tests are completely normal, you will receive your results only by: MyChart Message (if you have MyChart) OR A paper copy in the mail If you have any lab test that is abnormal or we need to change your treatment, we will call you to review the results.   Testing/Procedures: None ordered.    Follow-Up: At [facility], you and your health needs are our priority.  As part of our continuing mission to provide you with exceptional heart care, we have created designated Provider Care Teams.  These Care Teams include your primary Cardiologist (physician) and Advanced Practice Providers (APPs -  Physician Assistants and Nurse Practitioners) who all work together to provide you with the care you need, when you need it.  We recommend signing up for  the patient portal called "MyChart".  Sign up information is provided on this After Visit Summary.  MyChart is used to connect with patients for Virtual Visits (Telemedicine).  Patients are able to view lab/test results, encounter notes, upcoming appointments, etc.  Non-urgent messages can be sent to your provider as well.   To learn more about what you can do with MyChart, go to [email or web address].    Your next appointment:   4 month(s)  Provider:   You may see Dr. Addie Holstein or one of the following Advanced Practice Providers on your designated Care Team:   Laneta Pintos, NP Varney Gentleman, PA-C [name], PA-C [name], NP Morey Ar, NP

## 2023-11-07 NOTE — Progress Notes (Signed)
 Cardiology Office Note:    Date:  11/07/2023   ID:  Rhina Center, DOB [birth year], MRN [id number]  PCP:  Mimi Alt, MD  [facility] Cardiologist:  None  [facility] Electrophysiologist:  None   Referring MD: Blane Bunting, PA-C   Chief Complaint: 3 month follow-up  History of Present Illness:    Vanessa Hogan is a 83 y.o. female with a hx of hypertension, type 2, HFpEF, diabetes on insulin  who presents for follow-up.   Patient was initially seen in 2022 for evaluation of chest pain.  She reported prior treadmill stress test and Lexi Myoview's that were normal.  Plan was to reassess in a couple months.  She was last seen 2023 was overall stable from a cardiac perspective.   The patient went to the ER 08/07/23 for elevated BP. BP was 160/81. She was having no symptoms. It was as high was 190/73. Labs overall looked good.   She reports adverse side effects (muscle cramps and headaches) to Amlodipine, Lisinopril, micardis, triamterene, Losartan, Atenolol, Lopressor.   Patient was last seen in October 2024 reporting elevated blood pressures.  Patient was weaned off clonidine  and started on hydralazine .  Echo was ordered for murmur.  Echocardiogram showed LVEF 60 to 65%, grade 1 diastolic dysfunction.  Today, the patient reports she could not tolerate hydralazine . She restarted clonidine . Bps are normal to high at home. She denies chest pain, SOB, lightheadedness or dizziness. She has anxiety and this can cause palpitations. She is taking valsartan -hydrochlorothiazide  twice daily. Swelling on the feet is the same. She reports occasional chest pressure associated with palpitations from anxiety.  Past Medical History:  Diagnosis Date   Allergy    Anemia    Anxiety    Arthritis    Depression    Diabetes mellitus without complication (HCC)    GERD (gastroesophageal reflux disease)    Gout    Hypertension    Peptic ulcer 2023   Thyroid  disease      Past Surgical History:  Procedure Laterality Date   BREAST SURGERY     COLONOSCOPY WITH PROPOFOL  N/A 03/28/2023   Procedure: COLONOSCOPY WITH PROPOFOL ;  Surgeon: Selena Daily, MD;  Location: [facility] ENDOSCOPY;  Service: Gastroenterology;  Laterality: N/A;   ESOPHAGOGASTRODUODENOSCOPY (EGD) WITH PROPOFOL  N/A 03/28/2023   Procedure: ESOPHAGOGASTRODUODENOSCOPY (EGD) WITH PROPOFOL ;  Surgeon: Selena Daily, MD;  Location: [facility] ENDOSCOPY;  Service: Gastroenterology;  Laterality: N/A;   TUBAL LIGATION      Current Medications: Current Meds  Medication Sig   cloNIDine  (CATAPRES ) 0.2 MG tablet Take 0.2 mg by mouth 2 (two) times daily.   diphenhydrAMINE (BENADRYL) 25 mg capsule Take 25 mg by mouth daily.   furosemide  (LASIX ) 20 MG tablet Take 1 tablet (20 mg total) by mouth daily.   insulin  aspart protamine - aspart (NOVOLOG  MIX 70/30 FLEXPEN) (70-30) 100 UNIT/ML FlexPen INJECT 18 UNITS SUBCUTANEOUSLY EVERY MORNING AND 20 UNITS EVERY EVENING; if having any lows, can also consider reducing by up to 20% and then monitor to see if needs to adjust back up   Multiple Vitamins-Minerals (MULTIVITAMIN ADULTS 50+ PO) Take 1 tablet by mouth daily.   pantoprazole  (PROTONIX ) 40 MG tablet TAKE 1 TABLET(40 MG) BY MOUTH DAILY   Semaglutide ,0.25 or 0.5MG /DOS, (OZEMPIC , 0.25 OR 0.5 MG/DOSE,) 2 MG/3ML SOPN 0.25mg  weekly for 1 month, then increase to 0.5mg  weekly   sucralfate  (CARAFATE ) 1 g tablet Take 1 g by mouth daily.   valsartan  (DIOVAN ) 160 MG tablet Take 1 tablet (  160 mg total) by mouth 2 (two) times daily.   [DISCONTINUED] valsartan -hydrochlorothiazide  (DIOVAN -HCT) 160-12.5 MG tablet Take 1 tablet by mouth in the morning and at bedtime.     Allergies:   Asa [aspirin], Metformin  and related, Morphine and codeine, Nitroglycerin, Penicillins, and Sertraline   Social History   Socioeconomic History   Marital status: Married    Spouse name: Not on file   Number of children: Not on file   Years  of education: Not on file   Highest education level: Not on file  Occupational History   Not on file  Tobacco Use   Smoking status: Former    Current packs/day: 0.00    Average packs/day: 0.3 packs/day for 5.0 years (1.3 ttl pk-yrs)    Types: Cigarettes    Start date: 67    Quit date: 49    Years since quitting: 53.0   Smokeless tobacco: Former  Building services engineer status: Never Used  Substance and Sexual Activity   Alcohol use: Yes    Alcohol/week: 1.0 standard drink of alcohol    Types: 1 Glasses of wine per week    Comment: occasionally   Drug use: Never   Sexual activity: Not Currently    Birth control/protection: None  Other Topics Concern   Not on file  Social History Narrative   Not on file   Social Drivers of Health   Financial Resource Strain: Medium Risk (01/02/2023)   Overall Financial Resource Strain (CARDIA)    Difficulty of Paying Living Expenses: Somewhat hard  Food Insecurity: No Food Insecurity (08/27/2023)   Hunger Vital Sign    Worried About Running Out of Food in the Last Year: Never true    Ran Out of Food in the Last Year: Never true  Transportation Needs: No Transportation Needs (08/27/2023)   PRAPARE - Administrator, Civil Service (Medical): No    Lack of Transportation (Non-Medical): No  Physical Activity: Insufficiently Active (01/02/2023)   Exercise Vital Sign    Days of Exercise per Week: 7 days    Minutes of Exercise per Session: 10 min  Stress: No Stress Concern Present (12/26/2022)   [employer] of Occupational Health - Occupational Stress Questionnaire    Feeling of Stress : Only a little  Social Connections: Socially Isolated (08/27/2023)   Social Connection and Isolation Panel [NHANES]    Frequency of Communication with Friends and Family: Once a week    Frequency of Social Gatherings with Friends and Family: Never    Attends Religious Services: More than 4 times per year    Active Member of [employer] or Organizations:  Patient unable to answer    Attends Banker Meetings: Never    Marital Status: Separated     Family History: The patient's family history includes Dementia in her sister; Diabetes in her daughter; Hypertension in her brother and son; Stroke in her father and mother.  ROS:   Please see the history of present illness.     All other systems reviewed and are negative.  EKGs/Labs/Other Studies Reviewed:    The following studies were reviewed today:  Echo 10/2023  1. Left ventricular ejection fraction, by estimation, is 60 to 65%. The  left ventricle has normal function. The left ventricle has no regional  wall motion abnormalities. Left ventricular diastolic parameters are  consistent with Grade I diastolic  dysfunction (impaired relaxation).   2. Right ventricular systolic function is normal. The right ventricular  size  is normal.   3. The mitral valve is normal in structure. No evidence of mitral valve  regurgitation. No evidence of mitral stenosis.   4. The aortic valve is normal in structure. Aortic valve regurgitation is  not visualized. No aortic stenosis is present.   5. The inferior vena cava is normal in size with greater than 50%  respiratory variability, suggesting right atrial pressure of 3 mmHg.   EKG:  EKG is not  ordered today.   Recent Labs: 01/28/2023: Magnesium 1.9 02/19/2023: B Natriuretic Peptide 74.6 07/25/2023: TSH 0.991 08/07/2023: BUN 11; Creatinine, Ser 0.92; Hemoglobin 14.1; Platelets 227; Potassium 3.8; Sodium 139  Recent Lipid Panel    Component Value Date/Time   CHOL 204 (H) 12/28/2021 0933   TRIG 161 (H) 12/28/2021 0933   HDL 38 (L) 12/28/2021 0933   CHOLHDL 5.4 (H) 12/28/2021 0933   LDLCALC 137 (H) 12/28/2021 0933    Physical Exam:    VS:  BP (!) 140/76 (BP Location: Left Arm, Patient Position: Sitting, Cuff Size: Normal)   Pulse 81   Ht 5\' 6"  (1.676 m)   Wt 211 lb 2 oz (95.8 kg)   SpO2 98%   BMI 34.08 kg/m     Wt Readings  from Last 3 Encounters:  11/07/23 211 lb 2 oz (95.8 kg)  10/08/23 206 lb (93.4 kg)  09/07/23 208 lb 8 oz (94.6 kg)     GEN:  Well nourished, well developed in no acute distress HEENT: Normal NECK: No JVD; No carotid bruits LYMPHATICS: No lymphadenopathy CARDIAC: RRR, no murmurs, rubs, gallops RESPIRATORY:  Clear to auscultation without rales, wheezing or rhonchi  ABDOMEN: Soft, non-tender, non-distended MUSCULOSKELETAL:  1+ lower leg edema; No deformity  SKIN: Warm and dry NEUROLOGIC:  Alert and oriented x 3 PSYCHIATRIC:  Normal affect   ASSESSMENT:    1. Medication management   2. Essential hypertension   3. Lower leg edema   4. Chronic diastolic heart failure (HCC)   5. Chest pain, unspecified type    PLAN:    In order of problems listed above:  HTN Patient reports she did not tolerate hydralazine  due to palpitations.  Since last visit, her valsartan /hydrochlorothiazide  was doubled and she restarted her clonidine .  Blood pressure today is 140/76.  Blood pressures at home are normal to mildly elevated.  Patient has lower leg edema on exam.  I will stop hydrochlorothiazide  and start Lasix  20 mg daily we will continue valsartan  160 twice a day and clonidine  0.2 mg twice daily.  Lower leg edema HFpEF Echocardiogram showed normal pump function with grade 1 diastolic dysfunction.  She has 1+ pitting edema on exam.  I will stop hydrochlorothiazide  and start Lasix  20 mg daily as above. BMET in 2 weeks.  Chest pain Patient reports chest pressure associated with palpitations likely from anxiety.  Echo with normal pulm function and no wall motion abnormality.  No further workup at this time  Disposition: Follow up in 4 month(s) with MD/APP    Signed, Maecie Sevcik Rebekah Canada, PA-C  11/07/2023 10:59 AM    [facility]

## 2023-11-08 ENCOUNTER — Ambulatory Visit: Payer: Medicare HMO | Admitting: Family Medicine

## 2023-11-09 ENCOUNTER — Other Ambulatory Visit: Payer: Self-pay | Admitting: Family Medicine

## 2023-11-09 MED ORDER — PANTOPRAZOLE SODIUM 40 MG PO TBEC: 40.0000 mg | DELAYED_RELEASE_TABLET | Freq: Every day | ORAL | 0 refills | Status: DC

## 2023-11-09 NOTE — Telephone Encounter (Signed)
Medication Refill -  Most Recent Primary Care Visit:  Provider: Debera Lat  Department: [facility]  Visit Type: OFFICE VISIT  Date: 10/08/2023  Medication: pantoprazole (PROTONIX) 40 MG tablet   Has the patient contacted their pharmacy? Yes Contact provider for refills  Is this the correct pharmacy for this prescription? Yes  This is the patient's preferred pharmacy:  [id number] [place], [place] - [address] [place] [address] Phone: [phone] Fax: [phone]  Has the prescription been filled recently? Yes  Is the patient out of the medication? Yes  Has the patient been seen for an appointment in the last year OR does the patient have an upcoming appointment? Yes  Can we respond through MyChart? No  Agent: Please be advised that Rx refills may take up to 3 business days. We ask that you follow-up with your pharmacy.

## 2023-11-09 NOTE — Telephone Encounter (Signed)
Requested by patient to send to different pharmacy  Requested Prescriptions  Pending Prescriptions Disp Refills   pantoprazole (PROTONIX) 40 MG tablet 90 tablet 0    Sig: Take 1 tablet (40 mg total) by mouth daily.     Gastroenterology: Proton Pump Inhibitors Passed - 11/09/2023  3:15 PM      Passed - Valid encounter within last 12 months    Recent Outpatient Visits           1 month ago Dysuria   [facility] [place], [place], [place]   2 months ago Primary hypertension   [facility] [place], [place], [place]   2 months ago Primary hypertension   [facility] [place], [place], PA-C   3 months ago Hospital discharge follow-up   [facility] [place], [place], PA-C   3 months ago Annual physical exam   [facility] [place], [place], PA-C       Future Appointments             In 3 months Furth, Cadence H, PA-C [employer] [facility] at [place]   In 8 months Debera Lat, PA-C [facility]

## 2023-11-12 ENCOUNTER — Telehealth: Payer: Self-pay | Admitting: Family Medicine

## 2023-11-12 NOTE — Telephone Encounter (Signed)
Historical provider Can you refill?

## 2023-11-12 NOTE — Telephone Encounter (Signed)
[facility] pharmacy faxed refill request for the following medications:  sucralfate (CARAFATE) 1 g tablet    Please advise

## 2023-11-13 ENCOUNTER — Other Ambulatory Visit: Payer: Self-pay

## 2023-11-13 ENCOUNTER — Emergency Department
Admission: EM | Admit: 2023-11-13 | Discharge: 2023-11-13 | Disposition: A | Discharge status: Home / Self Care | Payer: Medicare HMO | Attending: Emergency Medicine | Admitting: Emergency Medicine

## 2023-11-13 DIAGNOSIS — E119 Type 2 diabetes mellitus without complications: Secondary | ICD-10-CM | POA: Insufficient documentation

## 2023-11-13 DIAGNOSIS — Z20822 Contact with and (suspected) exposure to covid-19: Secondary | ICD-10-CM | POA: Diagnosis not present

## 2023-11-13 DIAGNOSIS — H9202 Otalgia, left ear: Secondary | ICD-10-CM | POA: Insufficient documentation

## 2023-11-13 DIAGNOSIS — J029 Acute pharyngitis, unspecified: Secondary | ICD-10-CM | POA: Diagnosis not present

## 2023-11-13 DIAGNOSIS — I10 Essential (primary) hypertension: Secondary | ICD-10-CM | POA: Diagnosis not present

## 2023-11-13 LAB — RESP PANEL BY RT-PCR (RSV, FLU A&B, COVID)  RVPGX2
Influenza A by PCR: NEGATIVE
Influenza B by PCR: NEGATIVE
Resp Syncytial Virus by PCR: NEGATIVE
SARS Coronavirus 2 by RT PCR: NEGATIVE

## 2023-11-13 LAB — GROUP A STREP BY PCR: Group A Strep by PCR: NOT DETECTED

## 2023-11-13 NOTE — ED Triage Notes (Signed)
Pt reports left side otalgia and sore throat that began yesterday. Pt denies cough or congestion

## 2023-11-13 NOTE — Discharge Instructions (Signed)
You tested negative for strep, COVID, RSV and the flu today.   Please continue to take the allergy medicine. You can also use the vitamin E oil. You can have tylenol and motrin for throat pain.   If you have any new or worsening symptoms please return to the ED.

## 2023-11-13 NOTE — ED Provider Notes (Signed)
[facility] Provider Note    Event Date/Time   First MD Initiated Contact with Patient 11/13/23 2218     (approximate)   History   Otalgia and Sore Throat   HPI  Vanessa Hogan is a 83 y.o. female with PMH of diabetes, hypertension, GERD, anemia, anxiety, arthritis who presents for evaluation of sore throat and left-sided earache that began yesterday.  Patient states that she often has ear pain when the weather gets cold, she uses vitamin EE oil and puts a cotton ball in her ear which usually sounds a problem.  However the pain is now moving from her ear down into her throat which is new for her and this brought her to the ED.  She denies any associated URI symptoms like cough, congestion, fevers and headaches.      Physical Exam   Triage Vital Signs: ED Triage Vitals  Encounter Vitals Group     BP 11/13/23 2047 (!) 181/67     Systolic BP Percentile --      Diastolic BP Percentile --      Pulse Rate 11/13/23 2047 73     Resp 11/13/23 2047 18     Temp 11/13/23 2047 98.2 F (36.8 C)     Temp Source 11/13/23 2047 Oral     SpO2 11/13/23 2047 98 %     Weight 11/13/23 2047 210 lb (95.3 kg)     Height 11/13/23 2047 5\' 6"  (1.676 m)     Head Circumference --      Peak Flow --      Pain Score 11/13/23 2046 7     Pain Loc --      Pain Education --      Exclude from Growth Chart --     Most recent vital signs: Vitals:   11/13/23 2047  BP: (!) 181/67  Pulse: 73  Resp: 18  Temp: 98.2 F (36.8 C)  SpO2: 98%   General: Awake, no distress.  CV:  Good peripheral perfusion.  Resp:  Normal effort.  Abd:  No distention.  Other:  Bilateral TMs have dry flaky wax, unable to fully visualize the TM but does appear to be translucent from what I can see.  Oral mucous membranes are moist, there is no tonsillar enlargement or exudates, no tender lymphadenopathy.  No trismus.   ED Results / Procedures / Treatments   Labs (all labs ordered are  listed, but only abnormal results are displayed) Labs Reviewed  RESP PANEL BY RT-PCR (RSV, FLU A&B, COVID)  [id number]  GROUP A STREP BY PCR     PROCEDURES:  Critical Care performed: No  Procedures   MEDICATIONS ORDERED IN ED: Medications - No data to display   IMPRESSION / MDM / ASSESSMENT AND PLAN / ED COURSE  I reviewed the triage vital signs and the nursing notes.                             83 year old female presents for evaluation of left ear pain and sore throat. Patient was hypertensive but does have a history of hypertension, VSS otherwise. Patient NAD on exam.   Differential diagnosis includes, but is not limited to, viral URI, strep pharyngitis, otitis media, otitis externa.  Patient's presentation is most consistent with acute, uncomplicated illness.  Resp panel and strep test were negative.   I do not believe patient has otitis media or externa. There  is no erythema of the ear canal and the TM is not erythematous or bulging.   I am not sure what is causing patient's ear pain and sore throat but I do not feel it warrants further emergent work up.  It is most likely caused by a viral infection.  I discussed symptomatic management using Tylenol and ibuprofen as well as allergy medication.  Patient was given strict return precautions.  She voiced understanding, all questions were answered and she was stable at discharge.      FINAL CLINICAL IMPRESSION(S) / ED DIAGNOSES   Final diagnoses:  Left ear pain  Sore throat     Rx / DC Orders   ED Discharge Orders     None        Note:  This document was prepared using Dragon voice recognition software and may include unintentional dictation errors.   Cameron Ali, PA-C 11/13/23 2351    Merwyn Katos, MD 11/21/23 [phone]

## 2023-11-13 NOTE — Addendum Note (Signed)
Addended by: Lily Kocher on: 11/13/2023 03:55 PM   Modules accepted: Orders

## 2023-11-13 NOTE — Telephone Encounter (Signed)
Recommend office visit to meet new PCP and discuss medications to make sure med list is current

## 2023-11-14 ENCOUNTER — Telehealth: Payer: Self-pay

## 2023-11-14 NOTE — Transitions of Care (Post Inpatient/ED Visit) (Signed)
11/14/2023  Name: Vanessa Hogan MRN: [id number] DOB: June 21, 1941  Today's TOC FU Call Status: Today's TOC FU Call Status:: Successful TOC FU Call Completed TOC FU Call Complete Date: 11/14/23 Patient's Name and Date of Birth confirmed.  Transition Care Management Follow-up Telephone Call Date of Discharge: 11/13/23 Discharge Facility[facility]) Type of Discharge: Emergency Department Reason for ED Visit: Other: (sore throat) How have you been since you were released from the hospital?: Better Any questions or concerns?: No  Items Reviewed: Did you receive and understand the discharge instructions provided?: No Medications obtained,verified, and reconciled?: Yes (Medications Reviewed) Any new allergies since your discharge?: No Dietary orders reviewed?: Yes Do you have support at home?: No  Medications Reviewed Today: Medications Reviewed Today     Reviewed by Karena Addison, LPN (Licensed Practical Nurse) on 11/14/23 at 1157  Med List Status: <None>   Medication Order Taking? Sig Documenting Provider Last Dose Status Informant  ACCU-CHEK GUIDE test strip [id number] No 1 each by Other route 3 (three) times daily.  Patient not taking: Reported on 11/07/2023   Debera Lat, PA-C Not Taking Active   Accu-Chek Softclix Lancets lancets [id number] No Use as instructed  Patient not taking: Reported on 11/07/2023   Debera Lat, PA-C Not Taking Active   cloNIDine (CATAPRES) 0.2 MG tablet [id number] No Take 0.2 mg by mouth 2 (two) times daily. [provider] Taking Active   diphenhydrAMINE (BENADRYL) 25 mg capsule [id number] No Take 25 mg by mouth daily. [provider] Taking Active            Med Note Lorenso Courier, RAVEN M   Thu Oct 25, 2023 10:13 AM) For allergies- Claritin not strong enough  DULoxetine (CYMBALTA) 20 MG capsule [id number] No Take 1 capsule (20 mg total) by mouth daily.  Patient not taking: Reported on 11/07/2023    Debera Lat, PA-C Not Taking Active   furosemide (LASIX) 20 MG tablet [id number]  Take 1 tablet (20 mg total) by mouth daily. Furth, Cadence H, PA-C  Active   Glucosamine Sulfate-MSM (GLUCOSAMINE-MSM) 500-500 MG TABS [id number] No Take 1 tablet by mouth daily.  Patient not taking: Reported on 11/07/2023   [provider] Not Taking Active   insulin aspart protamine - aspart (NOVOLOG MIX 70/30 FLEXPEN) (70-30) 100 UNIT/ML FlexPen [id number] No INJECT 18 UNITS SUBCUTANEOUSLY EVERY MORNING AND 20 UNITS EVERY EVENING; if having any lows, can also consider reducing by up to 20% and then monitor to see if needs to adjust back up Debera Lat, PA-C Taking Active            Med Note (POWERS, RAVEN M   Thu Oct 25, 2023  9:39 AM) If BG <120, skips in the morning  Insulin Pen Needle (BD PEN NEEDLE NANO 2ND GEN) 32G X 4 MM MISC [id number] No 1 each by Does not apply route in the morning, at noon, and at bedtime.  Patient not taking: Reported on 11/07/2023   Dorcas Carrow, DO Not Taking Active   Insulin Pen Needle 32G X 4 MM MISC [id number] No Use to inject insulin twice daily  Patient not taking: Reported on 11/07/2023   Dorcas Carrow, DO Not Taking Active   LORazepam (ATIVAN) 0.5 MG tablet [id number] No Take 1 tablet by mouth at bedtime as needed  Patient not taking: Reported on 11/07/2023   Debera Lat, PA-C Not Taking Active   Multiple Vitamins-Minerals (MULTIVITAMIN ADULTS 50+ PO) [id number] No Take 1 tablet by  mouth daily. [provider] Taking Active   pantoprazole (PROTONIX) 40 MG tablet [id number]  Take 1 tablet (40 mg total) by mouth daily. Simmons-Robinson, Makiera, MD  Active   Semaglutide,0.25 or 0.5MG /DOS, (OZEMPIC, 0.25 OR 0.5 MG/DOSE,) 2 MG/3ML SOPN [id number] No 0.25mg  weekly for 1 month, then increase to 0.5mg  weekly Dorcas Carrow, DO Taking Active            Med Note Lorenso Courier, Marjory Lies   Thu Oct 25, 2023  9:47 AM) Injects Wednesday. Taking 0.25mg   sucralfate  (CARAFATE) 1 g tablet [id number] No Take 1 g by mouth daily. [provider] Taking Active            Med Note Lorenso Courier, RAVEN M   Thu Oct 25, 2023  9:47 AM) Prescribed as four times a day, but taking daily  valsartan (DIOVAN) 160 MG tablet [id number]  Take 1 tablet (160 mg total) by mouth 2 (two) times daily. Fransico Michael, Cadence H, PA-C  Active             Home Care and Equipment/Supplies: Were Home Health Services Ordered?: NA Any new equipment or medical supplies ordered?: NA  Functional Questionnaire: Do you need assistance with bathing/showering or dressing?: No Do you need assistance with meal preparation?: No Do you need assistance with eating?: No Do you have difficulty maintaining continence: No Do you need assistance with getting out of bed/getting out of a chair/moving?: No Do you have difficulty managing or taking your medications?: No  Follow up appointments reviewed: PCP Follow-up appointment confirmed?: No (declined) MD Provider Line Number:[phone] Given: No Specialist Hospital Follow-up appointment confirmed?: NA Do you need transportation to your follow-up appointment?: No Do you understand care options if your condition(s) worsen?: Yes-patient verbalized understanding    SIGNATURE Karena Addison, LPN [facility] Nurse Health Advisor Direct Dial [phone]

## 2023-11-15 ENCOUNTER — Ambulatory Visit: Payer: Medicare HMO | Admitting: Obstetrics and Gynecology

## 2023-11-15 ENCOUNTER — Encounter: Payer: Self-pay | Admitting: Obstetrics and Gynecology

## 2023-11-15 ENCOUNTER — Other Ambulatory Visit (HOSPITAL_COMMUNITY)
Admission: RE | Admit: 2023-11-15 | Discharge: 2023-11-15 | Disposition: A | Discharge status: Home / Self Care | Payer: Medicare HMO | Source: Ambulatory Visit | Attending: Obstetrics and Gynecology | Admitting: Obstetrics and Gynecology

## 2023-11-15 VITALS — BP 164/85 | HR 85 | Ht 66.0 in | Wt 213.8 lb

## 2023-11-15 DIAGNOSIS — Z113 Encounter for screening for infections with a predominantly sexual mode of transmission: Secondary | ICD-10-CM

## 2023-11-15 DIAGNOSIS — N898 Other specified noninflammatory disorders of vagina: Secondary | ICD-10-CM

## 2023-11-15 NOTE — Progress Notes (Signed)
HPI:      [name] is a 83 y.o. [id number] who LMP was No LMP recorded. Patient is postmenopausal.  Subjective:   She presents today stating that she had been separated from her husband for a number of years but they have now reunited.  She has begun having intercourse again.  Since beginning intercourse she has noticed a yellowish discharge.  She says that this is unusual for her as she did not have it over the last several years.  She has concerns regarding vaginal infection including the possibility of STD.  Cultures were attempted by her primary care physician and failed.  She would like to have a vaginal examination today and have me perform the cultures.    Hx: The following portions of the patient's history were reviewed and updated as appropriate:             She  has a past medical history of Allergy, Anemia, Anxiety, Arthritis, Depression, Diabetes mellitus without complication (HCC), GERD (gastroesophageal reflux disease), Gout, Hypertension, Peptic ulcer (2023), and Thyroid disease. She does not have any pertinent problems on file. She  has a past surgical history that includes Breast surgery; Tubal ligation; Esophagogastroduodenoscopy (egd) with propofol (N/A, 03/28/2023); and Colonoscopy with propofol (N/A, 03/28/2023). Her family history includes Dementia in her sister; Diabetes in her daughter; Hypertension in her brother and son; Stroke in her father and mother. She  reports that she quit smoking about 53 years ago. Her smoking use included cigarettes. She started smoking about 58 years ago. She has a 1.3 pack-year smoking history. She has quit using smokeless tobacco. She reports that she does not currently use alcohol after a past usage of about 1.0 standard drink of alcohol per week. She reports that she does not use drugs. She has a current medication list which includes the following prescription(s): accu-chek guide, accu-chek softclix lancets, clonidine,  diphenhydramine, novolog mix 70/30 flexpen, multiple vitamins-minerals, ozempic (0.25 or 0.5 mg/dose), sucralfate, valsartan, duloxetine, furosemide, glucosamine-msm, bd pen needle nano 2nd gen, insulin pen needle, lorazepam, and pantoprazole. She is allergic to asa [aspirin], metformin and related, morphine and codeine, nitroglycerin, penicillins, and sertraline.       Review of Systems:  Review of Systems  Constitutional: Denied constitutional symptoms, night sweats, recent illness, fatigue, fever, insomnia and weight loss.  Eyes: Denied eye symptoms, eye pain, photophobia, vision change and visual disturbance.  Ears/Nose/Throat/Neck: Denied ear, nose, throat or neck symptoms, hearing loss, nasal discharge, sinus congestion and sore throat.  Cardiovascular: Denied cardiovascular symptoms, arrhythmia, chest pain/pressure, edema, exercise intolerance, orthopnea and palpitations.  Respiratory: Denied pulmonary symptoms, asthma, pleuritic pain, productive sputum, cough, dyspnea and wheezing.  Gastrointestinal: Denied, gastro-esophageal reflux, melena, nausea and vomiting.  Genitourinary: See HPI for additional information.  Musculoskeletal: Denied musculoskeletal symptoms, stiffness, swelling, muscle weakness and myalgia.  Dermatologic: Denied dermatology symptoms, rash and scar.  Neurologic: Denied neurology symptoms, dizziness, headache, neck pain and syncope.  Psychiatric: Denied psychiatric symptoms, anxiety and depression.  Endocrine: Denied endocrine symptoms including hot flashes and night sweats.   Meds:   Current Outpatient Medications on File Prior to Visit  Medication Sig Dispense Refill   ACCU-CHEK GUIDE test strip 1 each by Other route 3 (three) times daily. 100 each 12   Accu-Chek Softclix Lancets lancets Use as instructed 100 each 1   cloNIDine (CATAPRES) 0.2 MG tablet Take 0.2 mg by mouth 2 (two) times daily.     diphenhydrAMINE (BENADRYL) 25 mg capsule Take 25 mg  by mouth  daily.     insulin aspart protamine - aspart (NOVOLOG MIX 70/30 FLEXPEN) (70-30) 100 UNIT/ML FlexPen INJECT 18 UNITS SUBCUTANEOUSLY EVERY MORNING AND 20 UNITS EVERY EVENING; if having any lows, can also consider reducing by up to 20% and then monitor to see if needs to adjust back up 15 mL 11   Multiple Vitamins-Minerals (MULTIVITAMIN ADULTS 50+ PO) Take 1 tablet by mouth daily.     Semaglutide,0.25 or 0.5MG /DOS, (OZEMPIC, 0.25 OR 0.5 MG/DOSE,) 2 MG/3ML SOPN 0.25mg  weekly for 1 month, then increase to 0.5mg  weekly 3 mL 6   sucralfate (CARAFATE) 1 g tablet Take 1 g by mouth daily.     valsartan (DIOVAN) 160 MG tablet Take 1 tablet (160 mg total) by mouth 2 (two) times daily. 180 tablet 3   DULoxetine (CYMBALTA) 20 MG capsule Take 1 capsule (20 mg total) by mouth daily. (Patient not taking: Reported on 10/25/2023) 30 capsule 3   furosemide (LASIX) 20 MG tablet Take 1 tablet (20 mg total) by mouth daily. (Patient not taking: Reported on 11/15/2023) 90 tablet 3   Glucosamine Sulfate-MSM (GLUCOSAMINE-MSM) 500-500 MG TABS Take 1 tablet by mouth daily. (Patient not taking: Reported on 11/15/2023)     Insulin Pen Needle (BD PEN NEEDLE NANO 2ND GEN) 32G X 4 MM MISC 1 each by Does not apply route in the morning, at noon, and at bedtime. (Patient not taking: Reported on 11/15/2023) 200 each 3   Insulin Pen Needle 32G X 4 MM MISC Use to inject insulin twice daily (Patient not taking: Reported on 11/15/2023) 200 each 12   LORazepam (ATIVAN) 0.5 MG tablet Take 1 tablet by mouth at bedtime as needed (Patient not taking: Reported on 10/25/2023) 20 tablet 0   pantoprazole (PROTONIX) 40 MG tablet Take 1 tablet (40 mg total) by mouth daily. (Patient not taking: Reported on 11/15/2023) 90 tablet 0   No current facility-administered medications on file prior to visit.      Objective:     Vitals:   11/15/23 0959  BP: (!) 164/85  Pulse: 85   Filed Weights   11/15/23 0959  Weight: 213 lb 12.8 oz (97 kg)               Physical examination   Pelvic:   Vulva: Normal appearance.  No lesions.  Vagina: No lesions or abnormalities noted.  Support: Normal pelvic support.  Urethra No masses tenderness or scarring.  Meatus Normal size without lesions or prolapse.  Cervix: Normal appearance.  No lesions.  Anus: Normal exam.  No lesions.  Perineum: Normal exam.  No lesions.            Assessment:    [id number] Patient Active Problem List   Diagnosis Date Noted   Primary hypertension 08/09/2023   Hypertension associated with diabetes (HCC) 05/18/2023   Frequent headaches 05/18/2023   Dementia (HCC) 05/18/2023   Increased urinary frequency 04/22/2023   Diabetes mellitus without complication (HCC) 04/22/2023   Gastric polyps 03/28/2023   Generalized abdominal pain 03/28/2023   Polyp of duodenum 03/28/2023   History of colonic polyps 03/28/2023   Polyp of colon 03/28/2023   Aortic atherosclerosis (HCC) 03/02/2023   Hyperlipidemia associated with type 2 diabetes mellitus (HCC) 10/02/2022   Osteoarthritis, knee 06/29/2022   Shoulder impingement syndrome, left 06/29/2022   Anxiety 06/29/2022   Epigastric abdominal pain 02/06/2022   Acute gout of right hand 10/04/2021   Type 2 diabetes mellitus with hyperglycemia, with long-term current use of  insulin (HCC) 09/13/2021   Dizziness 09/07/2021   Essential hypertension 08/25/2021   Foot pain, left 07/22/2021   Controlled type 2 diabetes mellitus with microalbuminuria (HCC) 07/21/2021   Gastroesophageal reflux disease 07/21/2021   Intermittent right-sided chest pain 2010     1. Vaginal discharge   2. Vaginal odor   3. Screening for STD (sexually transmitted disease)        Plan:            1.  Nuswab performed -will contact patient with any abnormal results. Orders No orders of the defined types were placed in this encounter.   No orders of the defined types were placed in this encounter.     F/U  Return for We will contact her with any abnormal  test results.  Elonda Husky, M.D. 11/15/2023 10:26 AM

## 2023-11-15 NOTE — Progress Notes (Signed)
Patient presents today due to vaginal discharge. She states starting in November she became sexually active again and since has had a malodorous yellow discharge.

## 2023-11-16 LAB — CERVICOVAGINAL ANCILLARY ONLY
Bacterial Vaginitis (gardnerella): NEGATIVE
Candida Glabrata: NEGATIVE
Candida Vaginitis: NEGATIVE
Chlamydia: NEGATIVE
Comment: NEGATIVE
Comment: NEGATIVE
Comment: NEGATIVE
Comment: NEGATIVE
Comment: NEGATIVE
Comment: NORMAL
Neisseria Gonorrhea: NEGATIVE
Trichomonas: NEGATIVE

## 2023-11-21 ENCOUNTER — Telehealth: Payer: Self-pay

## 2023-11-21 NOTE — Telephone Encounter (Signed)
Patient inquiring about 11/15/23 swab results. Advised negative/normal.

## 2023-11-22 ENCOUNTER — Telehealth: Payer: Self-pay

## 2023-11-22 ENCOUNTER — Telehealth: Payer: Self-pay | Admitting: Physician Assistant

## 2023-11-22 NOTE — Telephone Encounter (Signed)
Pt requesting call back to discuss medication authorization but did not leave name of medication in message   She would like refill sucralfate sent to [facility]. Please advise.

## 2023-11-22 NOTE — Telephone Encounter (Signed)
Gave pt a call regarding PAP Novo Nordisk [employer]) pt explain some one from Dr office had already started the process on her behalf.but ask to call her back in a few weeks.

## 2023-11-22 NOTE — Telephone Encounter (Signed)
Pt requesting call back to discuss medication authorization but did not leave name of medication in message

## 2023-11-28 ENCOUNTER — Encounter: Payer: Self-pay | Admitting: Family Medicine

## 2023-11-28 ENCOUNTER — Ambulatory Visit: Payer: Medicare Other | Admitting: Family Medicine

## 2023-11-28 VITALS — BP 132/76 | HR 70 | Temp 97.9°F | Resp 19 | Ht 66.0 in | Wt 211.0 lb

## 2023-11-28 DIAGNOSIS — K219 Gastro-esophageal reflux disease without esophagitis: Secondary | ICD-10-CM

## 2023-11-28 DIAGNOSIS — N898 Other specified noninflammatory disorders of vagina: Secondary | ICD-10-CM

## 2023-11-28 DIAGNOSIS — E1159 Type 2 diabetes mellitus with other circulatory complications: Secondary | ICD-10-CM | POA: Diagnosis not present

## 2023-11-28 DIAGNOSIS — I152 Hypertension secondary to endocrine disorders: Secondary | ICD-10-CM

## 2023-11-28 DIAGNOSIS — Z794 Long term (current) use of insulin: Secondary | ICD-10-CM

## 2023-11-28 DIAGNOSIS — Q809 Congenital ichthyosis, unspecified: Secondary | ICD-10-CM | POA: Diagnosis not present

## 2023-11-28 DIAGNOSIS — E1165 Type 2 diabetes mellitus with hyperglycemia: Secondary | ICD-10-CM

## 2023-11-28 MED ORDER — PANTOPRAZOLE SODIUM 40 MG PO TBEC
40.0000 mg | DELAYED_RELEASE_TABLET | Freq: Every day | ORAL | 0 refills | Status: DC
Start: 2023-11-28 — End: 2024-02-07

## 2023-11-28 MED ORDER — SUCRALFATE 1 G PO TABS
1.0000 g | ORAL_TABLET | Freq: Every day | ORAL | 0 refills | Status: DC
Start: 2023-11-28 — End: 2023-12-18

## 2023-11-28 NOTE — Progress Notes (Addendum)
 SUBJECTIVE:   Chief Complaint  Patient presents with   Medication Refill   HPI Presents for acute visit.    Discussed the use of AI scribe software for clinical note transcription with the patient, who gave verbal consent to proceed.  History of Present Illness Vanessa Hogan is a 83 year old female who presents for medication refills and management of her symptoms.  She has chronic abdominal pain that is worse in the morning, often waking her up, but subsides somewhat during the day. The pain was well-controlled with medication, which she has run out of due to a pharmacy mix-up. The medication was initially prescribed by a GI specialist. No associated symptoms like fever, diarrhea, constipation, blood in stool, nausea, or vomiting.  She experiences acid reflux, leading to a burning sensation in her chest and the need to sleep sitting up. These symptoms were also controlled with medication, which she has now run out of.  She has been experiencing a continuous vaginal discharge since November, which started after resuming intercourse with her husband. Tests for STDs, bacterial infections, and yeast infections were negative. The discharge is thin, and she uses a panty liner daily.  She mentions having extremely dry skin, which remains dry and flaky despite using face cream and drinking lots of water. She uses coconut oil regularly but still experiences dryness.      PERTINENT PMH / PSH: As above  OBJECTIVE:  BP 132/76   Pulse 70   Temp 97.9 F (36.6 C)   Resp 19   Ht 5' 6 (1.676 m)   Wt 211 lb (95.7 kg)   SpO2 97%   BMI 34.06 kg/m    Physical Exam Vitals reviewed.  Constitutional:      General: She is not in acute distress.    Appearance: Normal appearance. She is obese. She is not ill-appearing, toxic-appearing or diaphoretic.  Eyes:     General:        Right eye: No discharge.        Left eye: No discharge.     Conjunctiva/sclera: Conjunctivae normal.   Neck:     Thyroid : No thyromegaly or thyroid  tenderness.  Cardiovascular:     Rate and Rhythm: Normal rate and regular rhythm.     Heart sounds: Normal heart sounds.  Pulmonary:     Effort: Pulmonary effort is normal.     Breath sounds: Normal breath sounds.  Abdominal:     General: Bowel sounds are normal. There is no distension.     Palpations: Abdomen is soft.     Tenderness: There is no abdominal tenderness.  Musculoskeletal:        General: Normal range of motion.  Skin:    General: Skin is warm and dry.  Neurological:     General: No focal deficit present.     Mental Status: She is alert and oriented to person, place, and time. Mental status is at baseline.  Psychiatric:        Mood and Affect: Mood normal.        Behavior: Behavior normal.        Thought Content: Thought content normal.        Judgment: Judgment normal.           11/28/2023    2:23 PM 10/08/2023   10:06 AM 09/07/2023   11:09 AM 08/27/2023    9:35 AM 07/25/2023   10:09 AM  Depression screen PHQ 2/9  Decreased Interest 0 0 0  0 1  Down, Depressed, Hopeless 1 0 0 1 1  PHQ - 2 Score 1 0 0 1 2  Altered sleeping 1 2 2  1   Tired, decreased energy 1 1 1  1   Change in appetite 0 1 0  1  Feeling bad or failure about yourself  0 0 1  1  Trouble concentrating 0 1 1  1   Moving slowly or fidgety/restless 0 0 0  0  Suicidal thoughts 0 0 0  0  PHQ-9 Score 3 5 5  7   Difficult doing work/chores Somewhat difficult Somewhat difficult Somewhat difficult        11/28/2023    2:23 PM 10/08/2023   10:06 AM 09/07/2023   11:09 AM 07/25/2023   10:10 AM  GAD 7 : Generalized Anxiety Score  Nervous, Anxious, on Edge 1 1 2 2   Control/stop worrying 1 1 1 2   Worry too much - different things 1 1 1 2   Trouble relaxing 0 1 1 2   Restless 0 0 0 1  Easily annoyed or irritable 0 0 0 1  Afraid - awful might happen 0 1 1 3   Total GAD 7 Score 3 5 6 13   Anxiety Difficulty Somewhat difficult Somewhat difficult Somewhat  difficult     ASSESSMENT/PLAN:  Gastroesophageal reflux disease, unspecified whether esophagitis present Assessment & Plan: Patient reports burning in chest and need to sleep sitting up, symptoms previously managed with medication. -Refill Protonix  40 mg for 30 days. -Refill sulcrafate for 30 days.  Orders: -     Pantoprazole  Sodium; Take 1 tablet (40 mg total) by mouth daily.  Dispense: 30 tablet; Refill: 0 -     Sucralfate ; Take 1 tablet (1 g total) by mouth daily.  Dispense: 30 tablet; Refill: 0  Hypertension associated with diabetes (HCC) Assessment & Plan: Patient's blood pressure readings are variable, but generally elevated. Currently on Catapres  and Lasix . -Continue current medications and monitor blood pressure. -Consider consultation with cardiologist for further management.   Xeroderma Assessment & Plan: Patient reports extremely dry skin, not improved with over-the-counter creams. -Recommend use of coconut oil and vitamin E, and increase hydration. -Consider dermatology referral if condition worsens.   Vaginal discharge Assessment & Plan: Patient reports continuous vaginal discharge since resuming sexual activity. Previous tests for STDs, bacterial infection, and yeast infection were negative. Evaluated by OBGYN.  -Recommend daily probiotics to balance vaginal flora.   Type 2 diabetes mellitus with hyperglycemia, with long-term current use of insulin  [facility]) Assessment & Plan: Needs follow up for Diabetes management -Follow up with PCP      Transfer of Care Patient wishes to establish primary care with current provider. -Schedule follow-up appointment for transfer of care and comprehensive review of medical history and current issues.    PDMP reviewed  Return if symptoms worsen or fail to improve, for PCP.  Glenys Ferrari, MD

## 2023-11-28 NOTE — Patient Instructions (Addendum)
 It was a pleasure meeting you today. Thank you for allowing me to take part in your health care.  Our goals for today as we discussed include:  Refills sent for requested medication  You will need to schedule an appointment to establish care at this facility if not your PCP at [facility] is currently listed as Dr Rockie ShedGLENWOOD [place]. You can call them to set up an appointment  Phone: [phone] [address] [place] [address]    This is a list of the screening recommended for you and due dates:  Health Maintenance  Topic Date Due   Zoster (Shingles) Vaccine (1 of 2) Never done   DEXA scan (bone density measurement)  Never done   Hemoglobin A1C  06/30/2023   COVID-19 Vaccine (7 - 2024-25 season) 09/12/2023   Medicare Annual Wellness Visit  12/26/2023   Flu Shot  01/21/2024*   Yearly kidney health urinalysis for diabetes  12/28/2023   Complete foot exam   12/28/2023   Eye exam for diabetics  05/22/2024   Yearly kidney function blood test for diabetes  08/06/2024   DTaP/Tdap/Td vaccine (2 - Tdap) 12/27/2032   Pneumonia Vaccine  Completed   HPV Vaccine  Aged Out  *Topic was postponed. The date shown is not the original due date.      If you have any questions or concerns, please do not hesitate to call the office at [phone].  I look forward to our next visit and until then take care and stay safe.  Regards,   Glenys Ferrari, MD   [facility]

## 2023-11-29 ENCOUNTER — Encounter: Payer: Self-pay | Admitting: Family Medicine

## 2023-11-29 DIAGNOSIS — N898 Other specified noninflammatory disorders of vagina: Secondary | ICD-10-CM | POA: Insufficient documentation

## 2023-11-29 DIAGNOSIS — Q809 Congenital ichthyosis, unspecified: Secondary | ICD-10-CM | POA: Insufficient documentation

## 2023-11-29 NOTE — Assessment & Plan Note (Addendum)
 Patient reports continuous vaginal discharge since resuming sexual activity. Previous tests for STDs, bacterial infection, and yeast infection were negative. Evaluated by OBGYN.  -Recommend daily probiotics to balance vaginal flora.

## 2023-11-29 NOTE — Assessment & Plan Note (Signed)
 Patient reports extremely dry skin, not improved with over-the-counter creams. -Recommend use of coconut oil and vitamin E, and increase hydration. -Consider dermatology referral if condition worsens.

## 2023-11-29 NOTE — Assessment & Plan Note (Signed)
 Needs follow up for Diabetes management -Follow up with PCP

## 2023-11-29 NOTE — Assessment & Plan Note (Signed)
 Patient's blood pressure readings are variable, but generally elevated. Currently on Catapres  and Lasix . -Continue current medications and monitor blood pressure. -Consider consultation with cardiologist for further management.

## 2023-11-29 NOTE — Assessment & Plan Note (Signed)
 Patient reports burning in chest and need to sleep sitting up, symptoms previously managed with medication. -Refill Protonix  40 mg for 30 days. -Refill sulcrafate for 30 days.

## 2023-12-05 NOTE — Telephone Encounter (Signed)
Following up on pt PAP Novo Nordisk [employer] per [employer] pt has been APPROVED for 2025.

## 2023-12-18 ENCOUNTER — Other Ambulatory Visit: Payer: Self-pay | Admitting: Family Medicine

## 2023-12-18 DIAGNOSIS — K219 Gastro-esophageal reflux disease without esophagitis: Secondary | ICD-10-CM

## 2023-12-19 ENCOUNTER — Ambulatory Visit (INDEPENDENT_AMBULATORY_CARE_PROVIDER_SITE_OTHER): Payer: Medicare Other | Admitting: Family Medicine

## 2023-12-19 ENCOUNTER — Encounter: Payer: Self-pay | Admitting: Family Medicine

## 2023-12-19 VITALS — BP 154/70 | HR 74 | Ht 66.0 in | Wt 217.0 lb

## 2023-12-19 DIAGNOSIS — E1165 Type 2 diabetes mellitus with hyperglycemia: Secondary | ICD-10-CM | POA: Diagnosis not present

## 2023-12-19 DIAGNOSIS — I152 Hypertension secondary to endocrine disorders: Secondary | ICD-10-CM

## 2023-12-19 DIAGNOSIS — R42 Dizziness and giddiness: Secondary | ICD-10-CM

## 2023-12-19 DIAGNOSIS — I10 Essential (primary) hypertension: Secondary | ICD-10-CM | POA: Diagnosis not present

## 2023-12-19 DIAGNOSIS — F419 Anxiety disorder, unspecified: Secondary | ICD-10-CM

## 2023-12-19 DIAGNOSIS — E1159 Type 2 diabetes mellitus with other circulatory complications: Secondary | ICD-10-CM

## 2023-12-19 DIAGNOSIS — Z794 Long term (current) use of insulin: Secondary | ICD-10-CM

## 2023-12-19 MED ORDER — BUSPIRONE HCL 5 MG PO TABS
5.0000 mg | ORAL_TABLET | Freq: Two times a day (BID) | ORAL | 1 refills | Status: AC
Start: 2023-12-19 — End: ?

## 2023-12-19 MED ORDER — MECLIZINE HCL 12.5 MG PO TABS: 12.5000 mg | ORAL_TABLET | Freq: Three times a day (TID) | ORAL | 0 refills | Status: AC | PRN

## 2023-12-19 NOTE — Assessment & Plan Note (Signed)
 Chronic dizziness for a few months, primarily in the morning, associated with right-sided head sensations. No clear positional trigger. Previous carotid ultrasound and echocardiogram were normal. Differential includes labyrinthitis, atypical vertigo, and anxiety-related dizziness. No evidence of vertigo on exam. Discussed potential benefit of meclizine for symptom relief. Explained that if meclizine does not help, it should be discontinued. - Prescribe meclizine 12.5 mg, up to three times a day as needed - Follow up in one month to reassess dizziness

## 2023-12-19 NOTE — Assessment & Plan Note (Signed)
 Diabetes Mellitus Chronic  Lab Results  Component Value Date   HGBA1C 7.4 (H) 12/28/2022    Type 2 diabetes managed with Novolog 70/30. Recent blood glucose reading was elevated. Patient prefers fasting for lab tests. Discussed importance of regular monitoring and maintaining blood glucose levels within target range. - Order HbA1c test at next visit, pt prefers to fast before labs, reports being a "hard stick" - Continue current insulin regimen - Monitor blood glucose levels regularly

## 2023-12-19 NOTE — Progress Notes (Signed)
 Established patient visit   Patient: Vanessa Hogan   DOB: 29-Aug-1941   83 y.o. Female  MRN: [id number] Visit Date: 12/19/2023  Today's healthcare provider: Ronnald Ramp, MD   Chief Complaint  Patient presents with   Dizziness    She has been experiencing off and on dizzy spells for about a month recently    Subjective     HPI     Dizziness    Additional comments: She has been experiencing off and on dizzy spells for about a month recently       Last edited by Ronnald Ramp, MD on 12/19/2023  1:53 PM.       Discussed the use of AI scribe software for clinical note transcription with the patient, who gave verbal consent to proceed.  History of Present Illness   Vanessa Hogan is an 83 year old female with hypertension who presents with dizziness and fluctuating blood pressure.  She has been experiencing dizziness for several months, described as a sensation of 'water running in my head' on the right side, lasting about half an hour, followed by dizziness. The dizziness occurs without a specific pattern, sometimes while sitting or walking, and feels like she might lose consciousness. It builds up rather than coming on suddenly. She recalls passing out once in church, attributed to low blood pressure at the time. No current feelings of blacking out, but dizziness occurs a couple of times a day, lasting only a few moments, with no change in dizziness with position changes.  She has a history of hypertension, which has been stable for years but recently started fluctuating. Blood pressure readings vary, with some days being normal and others elevated. She was advised by a cardiologist to take medication for fluid in her legs, which is a new symptom. She has been on various antihypertensive medications in the past, including clonidine, which she currently takes at 0.2 mg twice a day, and Lasix 20 mg daily for swelling. She also takes  Novolog 70/30 insulin, 18 units in the morning and 20 units in the evening, adjusting based on her blood sugar readings.  She has a history of adverse reactions to several medications, including severe headaches with diltiazem, vision problems with myocarditis, and muscle pains with triamterene and hydrochlorothiazide. She is allergic to aspirin, metformin, morphine, nitroglycerin, penicillins, and sertraline.  She experienced ear pain last month, which led her to the emergency room, but no cause was found. She has a history of ear infections and uses cotton in her ears due to past issues with ear wax and infections.  She mentions experiencing significant stress recently, including an upcoming move to [place] to be closer to her children and issues with her husband, which she believes may be contributing to her symptoms.         Past Medical History:  Diagnosis Date   Allergy    Anemia    Anxiety    Arthritis    Depression    Diabetes mellitus without complication (HCC)    GERD (gastroesophageal reflux disease)    Gout    Hypertension    Peptic ulcer 2023   Thyroid disease     Medications: Outpatient Medications Prior to Visit  Medication Sig   ACCU-CHEK GUIDE test strip 1 each by Other route 3 (three) times daily.   Accu-Chek Softclix Lancets lancets Use as instructed   cloNIDine (CATAPRES) 0.2 MG tablet Take 0.2 mg by mouth 2 (two) times daily.  diphenhydrAMINE (BENADRYL) 25 mg capsule Take 25 mg by mouth daily.   furosemide (LASIX) 20 MG tablet Take 1 tablet (20 mg total) by mouth daily.   Glucosamine Sulfate-MSM (GLUCOSAMINE-MSM) 500-500 MG TABS Take 1 tablet by mouth daily.   insulin aspart protamine - aspart (NOVOLOG MIX 70/30 FLEXPEN) (70-30) 100 UNIT/ML FlexPen INJECT 18 UNITS SUBCUTANEOUSLY EVERY MORNING AND 20 UNITS EVERY EVENING; if having any lows, can also consider reducing by up to 20% and then monitor to see if needs to adjust back up   Insulin Pen Needle (BD  PEN NEEDLE NANO 2ND GEN) 32G X 4 MM MISC 1 each by Does not apply route in the morning, at noon, and at bedtime.   Insulin Pen Needle 32G X 4 MM MISC Use to inject insulin twice daily   Multiple Vitamins-Minerals (MULTIVITAMIN ADULTS 50+ PO) Take 1 tablet by mouth daily.   pantoprazole (PROTONIX) 40 MG tablet Take 1 tablet (40 mg total) by mouth daily.   Semaglutide,0.25 or 0.5MG /DOS, (OZEMPIC, 0.25 OR 0.5 MG/DOSE,) 2 MG/3ML SOPN 0.25mg  weekly for 1 month, then increase to 0.5mg  weekly   sucralfate (CARAFATE) 1 g tablet TAKE 1 TABLET BY MOUTH DAILY.   valsartan (DIOVAN) 160 MG tablet Take 1 tablet (160 mg total) by mouth 2 (two) times daily.   [DISCONTINUED] hydrALAZINE (APRESOLINE) 25 MG tablet Take 25 mg by mouth 2 (two) times daily.   DULoxetine (CYMBALTA) 20 MG capsule Take 1 capsule (20 mg total) by mouth daily.   LORazepam (ATIVAN) 0.5 MG tablet Take 1 tablet by mouth at bedtime as needed (Patient not taking: Reported on 12/19/2023)   No facility-administered medications prior to visit.    Review of Systems  Last CBC Lab Results  Component Value Date   WBC 4.6 08/07/2023   HGB 14.1 08/07/2023   HCT 44.8 08/07/2023   MCV 76.7 (L) 08/07/2023   MCH 24.1 (L) 08/07/2023   RDW 16.7 (H) 08/07/2023   PLT 227 08/07/2023   Last metabolic panel Lab Results  Component Value Date   GLUCOSE 87 08/07/2023   NA 139 08/07/2023   K 3.8 08/07/2023   CL 101 08/07/2023   CO2 28 08/07/2023   BUN 11 08/07/2023   CREATININE 0.92 08/07/2023   GFRNONAA >60 08/07/2023   CALCIUM 9.2 08/07/2023   PROT 6.9 10/18/2022   ALBUMIN 4.0 10/18/2022   LABGLOB 2.9 10/18/2022   AGRATIO 1.4 10/18/2022   BILITOT 0.3 10/18/2022   ALKPHOS 62 10/18/2022   AST 20 10/18/2022   ALT 14 10/18/2022   ANIONGAP 10 08/07/2023   Last lipids Lab Results  Component Value Date   CHOL 204 (H) 12/28/2021   HDL 38 (L) 12/28/2021   LDLCALC 137 (H) 12/28/2021   TRIG 161 (H) 12/28/2021   CHOLHDL 5.4 (H) 12/28/2021    Last hemoglobin A1c Lab Results  Component Value Date   HGBA1C 7.4 (H) 12/28/2022   Last thyroid functions Lab Results  Component Value Date   TSH 0.991 07/25/2023        Objective    BP (!) 154/70   Pulse 74   Ht 5\' 6"  (1.676 m)   Wt 217 lb (98.4 kg)   SpO2 100%   BMI 35.02 kg/m   BP Readings from Last 3 Encounters:  12/19/23 (!) 154/70  11/28/23 132/76  11/15/23 (!) 164/85   Wt Readings from Last 3 Encounters:  12/19/23 217 lb (98.4 kg)  11/28/23 211 lb (95.7 kg)  11/15/23 213 lb 12.8 oz (  97 kg)        Physical Exam  Physical Exam   VITALS: BP- 140/90 CHEST: Lungs clear to auscultation. CARDIOVASCULAR: No murmurs or bruits.  Neuro: PERRLA, 5/5 strength in bilateral upper extremities, no carotid bruits auscultated during exam       No results found for any visits on 12/19/23.  Assessment & Plan     Problem List Items Addressed This Visit       Cardiovascular and Mediastinum   Hypertension associated with diabetes (HCC) (Chronic)   Long-standing hypertension with recent fluctuations. Current medications include clonidine 0.2 mg twice daily and valsartan 160mg  BID. Blood pressure readings vary, with recent measurements showing elevated systolic pressure. Intolerance to multiple antihypertensive medications. Discussed importance of maintaining blood pressure under 150/90 and monitoring regularly. 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 Recent blood glucose reading was elevated. Patient prefers fasting for lab tests. Discussed importance of regular monitoring and maintaining blood glucose levels within  target range. - Order HbA1c test at next visit, pt prefers to fast before labs, reports being a "hard stick" - Continue current insulin regimen - Monitor blood glucose levels regularly        Other   Dizziness - Primary   Chronic dizziness for a few months, primarily in the morning, associated with right-sided head sensations. No clear positional trigger. Previous carotid ultrasound and echocardiogram were normal. Differential includes labyrinthitis, atypical vertigo, and anxiety-related dizziness. No evidence of vertigo on exam. Discussed potential benefit of meclizine for symptom relief. Explained that if meclizine does not help, it should be discontinued. - Prescribe meclizine 12.5 mg, up to three times a day as needed - Follow up in one month to reassess dizziness      Relevant Medications   busPIRone (BUSPAR) 5 MG tablet   meclizine (ANTIVERT) 12.5 MG tablet   Anxiety (Chronic)   Increased anxiety possibly contributing to dizziness. Lorazepam use for anxiety, but not taken recently. Discussed alternative treatment options. Explained that Buspar is a safer alternative for anxiety management in older adults and should be tried before considering other medications. Pt concerned about drinking alcohol to manage anxiety symptoms  Chronic, not well controlled  - Prescribe Buspar 5 mg twice daily as needed for anxiety - Advise to avoid alcohol to prevent exacerbation of dizziness and anxiety      Relevant Medications   busPIRone (BUSPAR) 5 MG tablet   Other Visit Diagnoses       Essential hypertension  (Chronic)                 Peripheral Edema New onset peripheral edema. Managed with Lasix 20 mg daily. Intolerance to multiple diuretics. Discussed monitoring for changes in edema and adjusting treatment as necessary. - Continue Lasix 20 mg daily - Monitor for changes in edema and adjust treatment as necessary   Return in about 1 month (around 01/16/2024) for Dizziness, DM,  HTN, Anxiety.         Ronnald Ramp, MD  [facility] [phone] (phone) [phone] (fax)  [facility]

## 2023-12-19 NOTE — Assessment & Plan Note (Deleted)
 Long-standing hypertension with recent fluctuations. Current medications include clonidine 0.2 mg twice daily and valsartan 160mg  BID. Blood pressure readings vary, with recent measurements showing elevated systolic pressure. Intolerance to multiple antihypertensive medications. Discussed importance of maintaining blood pressure under 150/90 and monitoring regularly. - Continue current antihypertensive regimen - Monitor blood pressure regularly - Reassess blood pressure control in follow-up visit

## 2023-12-19 NOTE — Patient Instructions (Signed)
 VISIT SUMMARY:  Today, we discussed your ongoing dizziness, anxiety, fluctuating blood pressure, new swelling in your legs, and diabetes management. We reviewed your symptoms, current medications, and recent stressors. We have made some adjustments to your treatment plan to help manage these issues more effectively.  YOUR PLAN:  -DIZZINESS: Dizziness can be caused by various factors, including inner ear problems or anxiety. We have prescribed meclizine 12.5 mg, which you can take up to three times a day as needed. If it does not help, please discontinue its use. We will reassess your dizziness in one month.  -ANXIETY: Anxiety can contribute to dizziness and other symptoms. We have prescribed Buspar 5 mg, which you can take twice daily as needed for anxiety. Please avoid alcohol as it can worsen dizziness and anxiety.  -HYPERTENSION: Hypertension, or high blood pressure, can fluctuate and needs to be monitored regularly. Continue taking your current medications, clonidine and valsartan, and keep track of your blood pressure. We aim to keep it under 150/90. We will reassess your blood pressure control at your next visit.  -PERIPHERAL EDEMA: Peripheral edema is swelling in the legs, often due to fluid retention. Continue taking Lasix 20 mg daily and monitor for any changes in the swelling. We will adjust your treatment if necessary.  -DIABETES MELLITUS: Type 2 diabetes requires regular monitoring of blood glucose levels. Continue with your current insulin regimen, Novolog 70/30, and monitor your blood glucose levels regularly. We will order an HbA1c test at your next visit to check your average blood sugar levels.  INSTRUCTIONS:  Please schedule a follow-up appointment in one month. Continue to follow up with cardiology as scheduled in May.

## 2023-12-19 NOTE — Assessment & Plan Note (Signed)
 Increased anxiety possibly contributing to dizziness. Lorazepam use for anxiety, but not taken recently. Discussed alternative treatment options. Explained that Buspar is a safer alternative for anxiety management in older adults and should be tried before considering other medications. Pt concerned about drinking alcohol to manage anxiety symptoms  Chronic, not well controlled  - Prescribe Buspar 5 mg twice daily as needed for anxiety - Advise to avoid alcohol to prevent exacerbation of dizziness and anxiety

## 2023-12-19 NOTE — Assessment & Plan Note (Signed)
 Long-standing hypertension with recent fluctuations. Current medications include clonidine 0.2 mg twice daily and valsartan 160mg  BID. Blood pressure readings vary, with recent measurements showing elevated systolic pressure. Intolerance to multiple antihypertensive medications. Discussed importance of maintaining blood pressure under 150/90 and monitoring regularly. - Continue current antihypertensive regimen - Monitor blood pressure regularly - Reassess blood pressure control in follow-up visit

## 2023-12-23 ENCOUNTER — Emergency Department

## 2023-12-23 ENCOUNTER — Emergency Department
Admission: EM | Admit: 2023-12-23 | Discharge: 2023-12-23 | Disposition: A | Discharge status: Home / Self Care | Attending: Emergency Medicine | Admitting: Emergency Medicine

## 2023-12-23 ENCOUNTER — Other Ambulatory Visit: Payer: Self-pay

## 2023-12-23 DIAGNOSIS — R0789 Other chest pain: Secondary | ICD-10-CM | POA: Diagnosis not present

## 2023-12-23 DIAGNOSIS — R0602 Shortness of breath: Secondary | ICD-10-CM | POA: Insufficient documentation

## 2023-12-23 DIAGNOSIS — I10 Essential (primary) hypertension: Secondary | ICD-10-CM | POA: Insufficient documentation

## 2023-12-23 DIAGNOSIS — E119 Type 2 diabetes mellitus without complications: Secondary | ICD-10-CM | POA: Diagnosis not present

## 2023-12-23 DIAGNOSIS — R079 Chest pain, unspecified: Secondary | ICD-10-CM | POA: Diagnosis present

## 2023-12-23 LAB — BASIC METABOLIC PANEL
Anion gap: 9 (ref 5–15)
BUN: 9 mg/dL (ref 8–23)
CO2: 25 mmol/L (ref 22–32)
Calcium: 9.4 mg/dL (ref 8.9–10.3)
Chloride: 105 mmol/L (ref 98–111)
Creatinine, Ser: 0.95 mg/dL (ref 0.44–1.00)
GFR, Estimated: 60 mL/min — ABNORMAL LOW (ref >60)
Glucose, Bld: 93 mg/dL (ref 70–99)
Potassium: 3.6 mmol/L (ref 3.5–5.1)
Sodium: 139 mmol/L (ref 135–145)

## 2023-12-23 LAB — CBC
HCT: 47.7 % — ABNORMAL HIGH (ref 36.0–46.0)
Hemoglobin: 14.8 g/dL (ref 12.0–15.0)
MCH: 24.6 pg — ABNORMAL LOW (ref 26.0–34.0)
MCHC: 31 g/dL (ref 30.0–36.0)
MCV: 79.2 fL — ABNORMAL LOW (ref 80.0–100.0)
Platelets: 220 10*3/uL (ref 150–400)
RBC: 6.02 MIL/uL — ABNORMAL HIGH (ref 3.87–5.11)
RDW: 17.4 % — ABNORMAL HIGH (ref 11.5–15.5)
WBC: 4.7 10*3/uL (ref 4.0–10.5)
nRBC: 0 % (ref 0.0–0.2)

## 2023-12-23 LAB — HEPATIC FUNCTION PANEL
ALT: 19 U/L (ref 0–44)
AST: 23 U/L (ref 15–41)
Albumin: 3.6 g/dL (ref 3.5–5.0)
Alkaline Phosphatase: 45 U/L (ref 38–126)
Bilirubin, Direct: <0.1 mg/dL (ref 0.0–0.2)
Total Bilirubin: 0.8 mg/dL (ref 0.0–1.2)
Total Protein: 7.2 g/dL (ref 6.5–8.1)

## 2023-12-23 LAB — TROPONIN I (HIGH SENSITIVITY)
Troponin I (High Sensitivity): 6 ng/L (ref <18)
Troponin I (High Sensitivity): 7 ng/L (ref <18)

## 2023-12-23 LAB — LIPASE, BLOOD: Lipase: 27 U/L (ref 11–51)

## 2023-12-23 MED ORDER — CLONIDINE HCL 0.3 MG PO TABS: 0.3000 mg | ORAL_TABLET | Freq: Two times a day (BID) | ORAL | 0 refills | Status: DC

## 2023-12-23 NOTE — ED Triage Notes (Signed)
 Patient reports sob and HTN that started yesterday.  Reports pain in her left arm.  Patient reports she took and ativan and it came down some but it was 190 SBP before coming to ER.  Patient denies actual chest pain but reports pressure.  +dizziness.

## 2023-12-23 NOTE — ED Provider Notes (Addendum)
 [facility] Provider Note    Event Date/Time   First MD Initiated Contact with Patient 12/23/23 0601     (approximate)   History   Chest Pain and Shortness of Breath   HPI Vanessa Hogan is a 83 y.o. female whose medical history includes hypertension, hyperlipidemia, diabetes, acid reflux, and anxiety.  She sees [facility] cardiology.  The more she presents for evaluation of elevated blood pressure over the last 24 hours or so as well as some mild chest pressure.  She states she did not feel quite right yesterday and she felt like her blood pressure was consistently elevated.  She says she took an Ativan which she often does to help bring down her blood pressure and that helped but did not completely resolve the issue.  She went to sleep and when she woke up she was having some numbness and tingling in her left arm as well as the mild chest pressure.  She checked her blood pressure again at home and it was more than 190 systolic so she came to the ED.  Without intervention she is now comfortable and reading a book in bed with a systolic blood pressure of 144.  She feels asymptomatic currently.     Physical Exam   Triage Vital Signs: ED Triage Vitals  Encounter Vitals Group     BP 12/23/23 0527 (!) 176/95     Systolic BP Percentile --      Diastolic BP Percentile --      Pulse Rate 12/23/23 0527 72     Resp 12/23/23 0527 18     Temp 12/23/23 0527 98.4 F (36.9 C)     Temp Source 12/23/23 0527 Oral     SpO2 12/23/23 0527 100 %     Weight 12/23/23 0528 98.4 kg (217 lb)     Height 12/23/23 0528 1.676 m (5\' 6" )     Head Circumference --      Peak Flow --      Pain Score 12/23/23 0528 8     Pain Loc --      Pain Education --      Exclude from Growth Chart --     Most recent vital signs: Vitals:   12/23/23 0527  BP: (!) 176/95  Pulse: 72  Resp: 18  Temp: 98.4 F (36.9 C)  SpO2: 100%    General: Awake, no distress.  CV:  Good peripheral  perfusion.  Regular rate and rhythm. Resp:  Normal effort. Speaking easily and comfortably, no accessory muscle usage nor intercostal retractions.   Abd:  No distention.    ED Results / Procedures / Treatments   Labs (all labs ordered are listed, but only abnormal results are displayed) Labs Reviewed  BASIC METABOLIC PANEL - Abnormal; Notable for the following components:      Result Value   GFR, Estimated 60 (*)    All other components within normal limits  CBC - Abnormal; Notable for the following components:   RBC 6.02 (*)    HCT 47.7 (*)    MCV 79.2 (*)    MCH 24.6 (*)    RDW 17.4 (*)    All other components within normal limits  HEPATIC FUNCTION PANEL  LIPASE, BLOOD  TROPONIN I (HIGH SENSITIVITY)  TROPONIN I (HIGH SENSITIVITY)     EKG  ED ECG REPORT I, Loleta Rose, the attending physician, personally viewed and interpreted this ECG.  Date: 12/23/2023 EKG Time: 5:30 AM Rate: 66  Rhythm: normal sinus rhythm QRS Axis: Left axis deviation Intervals: Right bundle branch block ST/T Wave abnormalities: Non-specific ST segment / T-wave changes, but no clear evidence of acute ischemia. Narrative Interpretation: no definitive evidence of acute ischemia; does not meet STEMI criteria.    RADIOLOGY I viewed and interpreted the patient's chest x-ray and I see no evidence of acute abnormality such as pneumonia or pulmonary edema.  I also read the radiologist's report, which confirmed no acute findings.   PROCEDURES:  Critical Care performed: No  Procedures    IMPRESSION / MDM / ASSESSMENT AND PLAN / ED COURSE  I reviewed the triage vital signs and the nursing notes.                              Differential diagnosis includes, but is not limited to, anxiety, aingina, ACS including unstable angina, PNA, PE.  Patient's presentation is most consistent with acute presentation with potential threat to life or bodily function.  Labs/studies ordered: CXR, EKG, CBC,  BMP, hepatic functional panel, lipase, troponin  Interventions/Medications given:  Medications - No data to display  (Note:  hospital course my include additional interventions and/or labs/studies not listed above.)   Patient is well-appearing and in no distress, comfortably reading a book and asymptomatic.  Her blood pressure has come down substantially without any intervention.  I had my usual hypertension discussion with the patient including the importance of not intervening, particularly when her blood pressure is still labile without intervention.  She is comfortable with this plan and just wanted make sure everything is okay.  She is low risk for ACS and I reviewed her cardiology notes from clinic including a note indicating that she has had a relatively recent Myoview study which was reassuring and she has an echocardiogram demonstrating normal ejection fraction.  I recommend to the patient she continue taking her regular medications and follow-up with cardiology.  The current plan is to repeat her troponin and then she will be discharged barring any laboratory abnormalities or recurrence of symptoms.  She understands and agrees with the plan.  The patient is on the cardiac monitor to evaluate for evidence of arrhythmia and/or significant heart rate changes.   Clinical Course as of 12/23/23 0730  Wynelle Link Dec 23, 2023  0730 Transferring ED care to Dr. Vicente Males to follow-up on the repeat troponin and the patient know she will be discharged if the repeat is normal. [CF]    Clinical Course User Index [CF] Loleta Rose, MD     FINAL CLINICAL IMPRESSION(S) / ED DIAGNOSES   Final diagnoses:  Atypical chest pain     Rx / DC Orders   ED Discharge Orders     None        Note:  This document was prepared using Dragon voice recognition software and may include unintentional dictation errors.   Loleta Rose, MD 12/23/23 Jearld Pies    Loleta Rose, MD 12/23/23 0730

## 2023-12-23 NOTE — ED Provider Notes (Signed)
 Emergency department handoff note  Care of this patient was signed out to me at the end of the previous provider shift.  All pertinent patient information was conveyed and all questions were answered.  Patient pending second troponin which is within normal limits.  Patient is not having any further chest pain at this time The patient has been reexamined and is ready to be discharged.  All diagnostic results have been reviewed and discussed with the patient/family.  Care plan has been outlined and the patient/family understands all current diagnoses, results, and treatment plans.  There are no new complaints, changes, or physical findings at this time.  All questions have been addressed and answered.  Patient was instructed to, and agrees to follow-up with their primary care physician, cardiologist, as well as return to the emergency department if any new or worsening symptoms develop.   Merwyn Katos, MD 12/23/23 [phone]

## 2023-12-23 NOTE — ED Notes (Signed)
Pt refusing offer of pain medication at this time

## 2023-12-23 NOTE — ED Notes (Signed)
Pt wheeled to waiting room. Pt verbalized understanding of discharge instructions.   

## 2023-12-27 ENCOUNTER — Encounter: Payer: Self-pay | Admitting: Cardiology

## 2023-12-27 ENCOUNTER — Ambulatory Visit: Attending: Cardiology | Admitting: Cardiology

## 2023-12-27 VITALS — BP 126/74 | HR 79 | Ht 66.0 in | Wt 214.8 lb

## 2023-12-27 DIAGNOSIS — E1165 Type 2 diabetes mellitus with hyperglycemia: Secondary | ICD-10-CM

## 2023-12-27 DIAGNOSIS — R079 Chest pain, unspecified: Secondary | ICD-10-CM

## 2023-12-27 DIAGNOSIS — R600 Localized edema: Secondary | ICD-10-CM

## 2023-12-27 DIAGNOSIS — I5032 Chronic diastolic (congestive) heart failure: Secondary | ICD-10-CM

## 2023-12-27 DIAGNOSIS — I10 Essential (primary) hypertension: Secondary | ICD-10-CM

## 2023-12-27 DIAGNOSIS — Z794 Long term (current) use of insulin: Secondary | ICD-10-CM

## 2023-12-27 DIAGNOSIS — E66811 Obesity, class 1: Secondary | ICD-10-CM

## 2023-12-27 MED ORDER — CLONIDINE HCL 0.3 MG PO TABS: 0.3000 mg | ORAL_TABLET | Freq: Two times a day (BID) | ORAL | 3 refills | Status: AC

## 2023-12-27 NOTE — Patient Instructions (Signed)
 Medication Instructions:   Your Physician recommend you continue on your current medication as directed.     *If you need a refill on your cardiac medications before your next appointment, please call your pharmacy*   Lab Work: None ordered. If you have labs (blood work) drawn today and your tests are completely normal, you will receive your results only by: MyChart Message (if you have MyChart) OR A paper copy in the mail If you have any lab test that is abnormal or we need to change your treatment, we will call you to review the results.   Testing/Procedures: None ordered.    Follow-Up: At [facility], you and your health needs are our priority.  As part of our continuing mission to provide you with exceptional heart care, we have created designated Provider Care Teams.  These Care Teams include your primary Cardiologist (physician) and Advanced Practice Providers (APPs -  Physician Assistants and Nurse Practitioners) who all work together to provide you with the care you need, when you need it.  We recommend signing up for the patient portal called "MyChart".  Sign up information is provided on this After Visit Summary.  MyChart is used to connect with patients for Virtual Visits (Telemedicine).  Patients are able to view lab/test results, encounter notes, upcoming appointments, etc.  Non-urgent messages can be sent to your provider as well.   To learn more about what you can do with MyChart, go to [email or web address].    Your next appointment:   3 month(s)  Provider:   Bryan Lemma, MD

## 2023-12-27 NOTE — Progress Notes (Signed)
 Cardiology Office Note:  .   Date:  12/27/2023  ID:  Vanessa Hogan, DOB Nov 04, 1940, MRN [id number] PCP: Ronnald Ramp, MD  [facility] Providers Cardiologist:  None    History of Present Illness: .   Vanessa Hogan is a 83 y.o. female with a past medical history of hypertension, type 2 diabetes, HFpEF, who presents for complaints of atypical chest pain after recent visit to the emergency department.  She was initially seen in 2022 for evaluation of chest pain.  She reported prior treadmill stress testing and Lexiscan Myoview that were normal.  Plan was to reassess in a couple of months.  She was last seen in 2023 was overall stable from a cardiac perspective without the need for further testing at that time.  She been evaluated in the emergency department 08/07/2023 for elevated blood pressure.  Blood pressure was 160/81.  No other associated symptoms.  Labs overall looked good workup was unrevealing.  She reported several adverse side effects to amlodipine, lisinopril, Micardis, triamterene, losartan, atenolol, and Lopressor.  She was seen in October 2024 reporting elevated blood pressures.  She had been on clonidine and started on eyedrops only.  Echocardiogram was ordered for appointment.  It revealed an LVEF of 60 to 65%, G1 DD.  She was last seen in clinic 11/07/2023 which she reports she could not tolerate hydralazine.  She restarted clonidine.  Blood pressures were normal to high at home.  She denied any other associated symptoms.  She was also taking valsartan HCTZ twice daily.  She continued to complain of swelling in her feet and occasional chest pressure associated with palpitations from anxiety.  Her HCTZ was stopped and she was started on furosemide and continued on valsartan and clonidine.  She was scheduled for labs in 2 weeks.  She presented to the [facility] emergency department 12/23/2023 with complaints of chest pain and shortness of breath.  She  reported elevated blood pressures in the last 24 hours as well as mild chest pressure.  Stated did not feel quite right yesterday and she felt like her blood pressure was consistently elevated.  She says she took Ativan which she often does to help her bring her blood pressure down but it did not completely resolve her issues.  Blood pressure was noted to be 190 systolic so she came to the emergency department.  Without intervention she was now comfortable awaiting a book in bed with systolic blood pressure 140 and was asymptomatic.  Blood pressure on arrival was 176/95, pulse of 72, respiration of 18, temperature of 98.4.  Labs revealed a GFR of 60, troponins were negative x 2.EKG revealed sinus rhythm with a rate of 66 with right bundle branch block but no definitive evidence of acute ischemia.  Workup was revealing and she was discharged.  She returns to clinic today stating that she continues to have occasional labile blood pressure with associated dizziness and lightheadedness.  She continues to have chronic swelling to her bilateral lower extremities.  She stated that when she was taking furosemide she felt like she was going to pass out while sitting in church.  So she has stopped taking furosemide.  In the emergency department her clonidine was increased from 0.2 mg to 0.3 mg twice daily.  She did not think or shortness of breath.  She continues to have anxiety which is longstanding.  She also notes occasional palpitations are related primarily to her anxiety.  ROS: 10 point review of systems has been  reviewed and considered negative with exception was been listed in HPI  Studies Reviewed: Marland Kitchen   EKG Interpretation Date/Time:  Thursday December 27 2023 10:56:44 EST Ventricular Rate:  79 PR Interval:  276 QRS Duration:  148 QT Interval:  406 QTC Calculation: 465 R Axis:   -52  Text Interpretation: Sinus rhythm with 1st degree A-V block Left axis deviation Right bundle branch block Inferior infarct ,  age undetermined When compared with ECG of 23-Dec-2023 05:30, Confirmed by Charlsie Quest ([id number]) on 12/27/2023 11:01:40 AM    Echo 10/2023  1. Left ventricular ejection fraction, by estimation, is 60 to 65%. The  left ventricle has normal function. The left ventricle has no regional  wall motion abnormalities. Left ventricular diastolic parameters are  consistent with Grade I diastolic  dysfunction (impaired relaxation).   2. Right ventricular systolic function is normal. The right ventricular  size is normal.   3. The mitral valve is normal in structure. No evidence of mitral valve  regurgitation. No evidence of mitral stenosis.   4. The aortic valve is normal in structure. Aortic valve regurgitation is  not visualized. No aortic stenosis is present.   5. The inferior vena cava is normal in size with greater than 50%  respiratory variability, suggesting right atrial pressure of 3 mmHg.    Risk Assessment/Calculations:             Physical Exam:   VS:  BP 126/74 (BP Location: Left Arm, Patient Position: Sitting, Cuff Size: Normal)   Pulse 79   Ht 5\' 6"  (1.676 m)   Wt 214 lb 12.8 oz (97.4 kg)   SpO2 98%   BMI 34.67 kg/m    Wt Readings from Last 3 Encounters:  12/27/23 214 lb 12.8 oz (97.4 kg)  12/23/23 217 lb (98.4 kg)  12/19/23 217 lb (98.4 kg)    GEN: Well nourished, well developed in no acute distress NECK: No JVD; No carotid bruits CARDIAC: RRR, no murmurs, rubs, gallops RESPIRATORY:  Clear to auscultation without rales, wheezing or rhonchi  ABDOMEN: Soft, non-tender, non-distended EXTREMITIES:  1+ pitting edema; No deformity   ASSESSMENT AND PLAN: .   Atypical chest pain that was noted prior to her recent emergency department visit with elevated blood pressures.  Chest pain has resolved.  Echocardiogram revealed no wall motion abnormality.  Workup in the emergency department was unrevealing.  EKG today reveals sinus rhythm rate of 79 with first-degree AV block elected to  pursue elective right bundle branch block which is unchanged from prior studies.  No further testing needed at this time.  Essential hypertension with blood pressure today 126/74.  Blood pressure has been stable since recent adjustment of her clonidine in the emergency department.  She has been sent a refill of her clonidine 0.3 mg twice daily today per her request.  She has also been encouraged to continue taking furosemide as needed and valsartan 60 mg twice daily.  Kidney function and electrolytes were reassessed her recent emergency department visit and were found to be normal.  Chronic HFpEF with lower leg edema with recent echocardiogram completed 10/2023 with an LVEF of 60 to 65%, no RWMA, G1 DD, right ventricular systolic function is normal with normal size , no valvular abnormalities noted.  She continues to have chronic swelling to her bilateral lower extremities.  She had stopped taking previously scheduled furosemide.  She has been encouraged to take a first as needed basis for weight gain, shortness of breath, or worsening swelling.  She has also been advised to avoid high sodium foods and avoid fast food, prepackaged and preprocessed foods.  She is also been encouraged to participate in conservative therapy of elevating extremities, vacuum pumps, decreased sodium intake, compression stockings.  Obesity with a BMI of 34.67.  Encouraged to increase her activity.  She has also been continued on Ozempic.  Type 2 diabetes which she notes improvement in A1c.  She is continued on insulin and Ozempic.  Ongoing management by PCP.       Dispo: Patient return to clinic to see primary cardiologist Dr. Herbie Baltimore in 3 months or sooner if needed for reevaluation of symptoms.  Signed, Chanel Mcadams, NP

## 2024-01-07 ENCOUNTER — Other Ambulatory Visit: Payer: Self-pay | Admitting: Physician Assistant

## 2024-01-07 DIAGNOSIS — E119 Type 2 diabetes mellitus without complications: Secondary | ICD-10-CM

## 2024-01-08 ENCOUNTER — Telehealth: Payer: Self-pay | Admitting: Family Medicine

## 2024-01-08 NOTE — Telephone Encounter (Signed)
 Discontinud None Debera Lat, [place] 05/07/23 [id number]       Requested Prescriptions  Refused Prescriptions Disp Refills   Insulin Lispro Prot & Lispro (HUMALOG MIX 75/25 KWIKPEN) (75-25) 100 UNIT/ML [employer] Med Name: HUMALOG MIX 75-25 KWIKPEN]  1    Sig: INJECT 18 UNITS SUBCUTANEOUSLY EVERY MORNING AND 20 UNITS EVERY EVENING     Endocrinology:  Diabetes - Insulins Failed - 01/08/2024  2:49 PM      Failed - HBA1C is between 0 and 7.9 and within 180 days    HB A1C (BAYER DCA - WAIVED)  Date Value Ref Range Status  12/28/2022 7.4 (H) 4.8 - 5.6 % Final    Comment:             Prediabetes: 5.7 - 6.4          Diabetes: >6.4          Glycemic control for adults with diabetes: <7.0          Passed - Valid encounter within last 6 months    Recent Outpatient Visits           3 months ago Dysuria   [facility] [place], [place], PA-C   4 months ago Primary hypertension   [facility] [place], [place], PA-C   4 months ago Primary hypertension   [place] [facility] [place], [place], PA-C   5 months ago Hospital discharge follow-up   [facility] [place], [place], PA-C   5 months ago Annual physical exam   [facility] [place], [place], PA-C       Future Appointments             In 2 weeks Simmons-Robinson, Tawanna Cooler, MD [facility], [place]   In 1 month Herbie Baltimore, Piedad Climes, MD [facility] at [place]   In 6 months [place], Estill Batten [facility], [facility]

## 2024-01-08 NOTE — Telephone Encounter (Signed)
 Called to inform Patient that her Ozempic and flex pens has arrived.. No answer, no VM set up and no mychart set up..Left message with Daughter-Angela Bell.

## 2024-01-12 ENCOUNTER — Other Ambulatory Visit: Payer: Self-pay | Admitting: Family Medicine

## 2024-01-12 DIAGNOSIS — R42 Dizziness and giddiness: Secondary | ICD-10-CM

## 2024-01-14 NOTE — Telephone Encounter (Signed)
 Requested medication (s) are due for refill today: No  Requested medication (s) are on the active medication list: Yes  Last refill:  12/19/23  Future visit scheduled: Yes  Notes to clinic:  See pharmacy request.    Requested Prescriptions  Pending Prescriptions Disp Refills   busPIRone (BUSPAR) 5 MG tablet [Pharmacy Med Name: BUSPIRONE HCL 5 MG TABLET] 180 tablet 1    Sig: TAKE 1 TABLET BY MOUTH TWICE A DAY     Psychiatry: Anxiolytics/Hypnotics - Non-controlled Passed - 01/14/2024  3:37 PM      Passed - Valid encounter within last 12 months    Recent Outpatient Visits           3 months ago Dysuria   [facility] [facility] [place], [place], PA-C   4 months ago Primary hypertension   [place] [facility] [place], [place], PA-C   4 months ago Primary hypertension   [place] [facility] [place], [place], PA-C   5 months ago Hospital discharge follow-up   [facility] [place], [place], PA-C   5 months ago Annual physical exam   [facility] [place], [place], PA-C       Future Appointments             In 1 week Simmons-Robinson, Tawanna Cooler, MD [facility], [place]   In 1 month Herbie Baltimore, Piedad Climes, MD [facility] at [place]   In 6 months [place], [place] Batten [facility], [facility]

## 2024-01-23 ENCOUNTER — Other Ambulatory Visit: Payer: Self-pay | Admitting: Family Medicine

## 2024-01-23 ENCOUNTER — Ambulatory Visit (INDEPENDENT_AMBULATORY_CARE_PROVIDER_SITE_OTHER): Payer: Medicare Other | Admitting: Family Medicine

## 2024-01-23 ENCOUNTER — Encounter: Payer: Self-pay | Admitting: Family Medicine

## 2024-01-23 VITALS — BP 147/70 | HR 66 | Resp 16 | Ht 66.0 in | Wt 213.0 lb

## 2024-01-23 DIAGNOSIS — G8929 Other chronic pain: Secondary | ICD-10-CM

## 2024-01-23 DIAGNOSIS — Z794 Long term (current) use of insulin: Secondary | ICD-10-CM

## 2024-01-23 DIAGNOSIS — I152 Hypertension secondary to endocrine disorders: Secondary | ICD-10-CM

## 2024-01-23 DIAGNOSIS — E1159 Type 2 diabetes mellitus with other circulatory complications: Secondary | ICD-10-CM

## 2024-01-23 DIAGNOSIS — R519 Headache, unspecified: Secondary | ICD-10-CM | POA: Diagnosis not present

## 2024-01-23 DIAGNOSIS — N6452 Nipple discharge: Secondary | ICD-10-CM

## 2024-01-23 DIAGNOSIS — B351 Tinea unguium: Secondary | ICD-10-CM

## 2024-01-23 DIAGNOSIS — N63 Unspecified lump in unspecified breast: Secondary | ICD-10-CM

## 2024-01-23 DIAGNOSIS — E1165 Type 2 diabetes mellitus with hyperglycemia: Secondary | ICD-10-CM

## 2024-01-23 DIAGNOSIS — N6489 Other specified disorders of breast: Secondary | ICD-10-CM

## 2024-01-23 LAB — POCT GLYCOSYLATED HEMOGLOBIN (HGB A1C): Hemoglobin A1C: 7.4 % — AB (ref 4.0–5.6)

## 2024-01-23 NOTE — Progress Notes (Signed)
 Established patient visit   Patient: Vanessa Hogan   DOB: 1941/02/06   83 y.o. Female  MRN: [id number] Visit Date: 01/23/2024  Today's healthcare provider: Ronnald Ramp, MD   Chief Complaint  Patient presents with   Medical Management of Chronic Issues    Follow-up dizziness,HTN anxiety and DM. Reports that she is having pain, right side of head daily now.   Breast Problem    Frequency: x 2 month Symptoms: discharge, brown color. No swelling,no pain.   Subjective     HPI     Medical Management of Chronic Issues    Additional comments: Follow-up dizziness,HTN anxiety and DM. Reports that she is having pain, right side of head daily now.        Breast Problem    Additional comments: Frequency: x 2 month Symptoms: discharge, brown color. No swelling,no pain.      Last edited by Marjie Skiff, CMA on 01/23/2024 10:27 AM.       Discussed the use of AI scribe software for clinical note transcription with the patient, who gave verbal consent to proceed.  History of Present Illness Vanessa Hogan is a 83 year old female with anxiety, diabetes, and hypertension who presents with breast discharge and right-sided headache.  She experiences a right-sided headache that sometimes begins with a sensation akin to 'when your foot falls asleep' on the right side of her head or near the top. This sensation can develop into a non-throbbing pain lasting about five minutes or longer, occurring intermittently, sometimes more than once a day. Recently, the headache has been waking her from sleep, but walking around helps alleviate it. She associates the headache with her clonidine use, noting that when she stopped taking it, the headaches ceased, but they returned after the dose was increased to 300 mg. She continues to take clonidine, 0.3 mg twice daily, but it does not significantly impact the headaches. A carotid ultrasound was negative, and a brain MRI  two years ago did not reveal any abnormalities. No weakness, balance issues, or vertigo.  She reports a right breast discharge that has been occurring for about two months. The discharge was darker initially, now lighter, and she associates its onset with taking a supplement for pH balance, which she stopped without improvement in the discharge. No breast pain, lumps, or trauma. She also reports a history of vaginal discharge that resolved after starting the supplement. No vaginal bleeding.  Her blood pressure was recorded at 147/70 mmHg. She is currently on clonidine 0.3 mg twice daily for hypertension. She associates the increase in headache frequency with the increase in clonidine dosage.  For diabetes management, an A1c and urine albumin were collected during this visit. She mentions a previous issue with insufficient urine sample collection.     Past Medical History:  Diagnosis Date   Allergy    Anemia    Anxiety    Arthritis    Depression    Diabetes mellitus without complication (HCC)    GERD (gastroesophageal reflux disease)    Gout    Hypertension    Peptic ulcer 2023   Thyroid disease     Medications: Outpatient Medications Prior to Visit  Medication Sig   ACCU-CHEK GUIDE test strip 1 each by Other route 3 (three) times daily.   Accu-Chek Softclix Lancets lancets Use as instructed   cloNIDine (CATAPRES) 0.3 MG tablet Take 1 tablet (0.3 mg total) by mouth 2 (two) times daily.  diphenhydrAMINE (BENADRYL) 25 mg capsule Take 25 mg by mouth daily.   DULoxetine (CYMBALTA) 20 MG capsule Take 1 capsule (20 mg total) by mouth daily.   Glucosamine Sulfate-MSM (GLUCOSAMINE-MSM) 500-500 MG TABS Take 1 tablet by mouth daily.   insulin aspart protamine - aspart (NOVOLOG MIX 70/30 FLEXPEN) (70-30) 100 UNIT/ML FlexPen INJECT 18 UNITS SUBCUTANEOUSLY EVERY MORNING AND 20 UNITS EVERY EVENING; if having any lows, can also consider reducing by up to 20% and then monitor to see if needs to  adjust back up   Insulin Pen Needle (BD PEN NEEDLE NANO 2ND GEN) 32G X 4 MM MISC 1 each by Does not apply route in the morning, at noon, and at bedtime.   Insulin Pen Needle 32G X 4 MM MISC Use to inject insulin twice daily   Multiple Vitamins-Minerals (MULTIVITAMIN ADULTS 50+ PO) Take 1 tablet by mouth daily.   pantoprazole (PROTONIX) 40 MG tablet Take 1 tablet (40 mg total) by mouth daily.   Semaglutide,0.25 or 0.5MG /DOS, (OZEMPIC, 0.25 OR 0.5 MG/DOSE,) 2 MG/3ML SOPN 0.25mg  weekly for 1 month, then increase to 0.5mg  weekly   sucralfate (CARAFATE) 1 g tablet TAKE 1 TABLET BY MOUTH DAILY.   valsartan (DIOVAN) 160 MG tablet Take 1 tablet (160 mg total) by mouth 2 (two) times daily.   busPIRone (BUSPAR) 5 MG tablet Take 1 tablet (5 mg total) by mouth 2 (two) times daily. (Patient not taking: Reported on 01/23/2024)   furosemide (LASIX) 20 MG tablet Take 1 tablet (20 mg total) by mouth daily. (Patient not taking: Reported on 01/23/2024)   LORazepam (ATIVAN) 0.5 MG tablet Take 1 tablet by mouth at bedtime as needed (Patient not taking: Reported on 12/19/2023)   meclizine (ANTIVERT) 12.5 MG tablet Take 1 tablet (12.5 mg total) by mouth 3 (three) times daily as needed for dizziness. (Patient not taking: Reported on 01/23/2024)   No facility-administered medications prior to visit.    Review of Systems  Last CBC Lab Results  Component Value Date   WBC 4.7 12/23/2023   HGB 14.8 12/23/2023   HCT 47.7 (H) 12/23/2023   MCV 79.2 (L) 12/23/2023   MCH 24.6 (L) 12/23/2023   RDW 17.4 (H) 12/23/2023   PLT 220 12/23/2023   Last metabolic panel Lab Results  Component Value Date   GLUCOSE 93 12/23/2023   NA 139 12/23/2023   K 3.6 12/23/2023   CL 105 12/23/2023   CO2 25 12/23/2023   BUN 9 12/23/2023   CREATININE 0.95 12/23/2023   GFRNONAA 60 (L) 12/23/2023   CALCIUM 9.4 12/23/2023   PROT 7.2 12/23/2023   ALBUMIN 3.6 12/23/2023   LABGLOB 2.9 10/18/2022   AGRATIO 1.4 10/18/2022   BILITOT 0.8  12/23/2023   ALKPHOS 45 12/23/2023   AST 23 12/23/2023   ALT 19 12/23/2023   ANIONGAP 9 12/23/2023   Last lipids Lab Results  Component Value Date   CHOL 204 (H) 12/28/2021   HDL 38 (L) 12/28/2021   LDLCALC 137 (H) 12/28/2021   TRIG 161 (H) 12/28/2021   CHOLHDL 5.4 (H) 12/28/2021   Last hemoglobin A1c Lab Results  Component Value Date   HGBA1C 7.4 (A) 01/23/2024   Last thyroid functions Lab Results  Component Value Date   TSH 0.991 07/25/2023   Last vitamin D Lab Results  Component Value Date   VD25OH 37.6 10/18/2022   Last vitamin B12 and Folate Lab Results  Component Value Date   VITAMINB12 716 07/04/2021  Objective    BP (!) 147/70 (BP Location: Right Arm, Patient Position: Sitting, Cuff Size: Normal)   Pulse 66   Resp 16   Ht 5\' 6"  (1.676 m)   Wt 213 lb (96.6 kg)   SpO2 99%   BMI 34.38 kg/m   BP Readings from Last 3 Encounters:  01/23/24 (!) 147/70  12/27/23 126/74  12/23/23 (!) 175/85   Wt Readings from Last 3 Encounters:  01/23/24 213 lb (96.6 kg)  12/27/23 214 lb 12.8 oz (97.4 kg)  12/23/23 217 lb (98.4 kg)        Physical Exam Constitutional:      General: She is not in acute distress.    Appearance: Normal appearance. She is not ill-appearing.  Pulmonary:     Effort: Pulmonary effort is normal. No respiratory distress.  Chest:    Feet:     Right foot:     Protective Sensation: 6 sites tested.  6 sites sensed.     Skin integrity: Dry skin present. No ulcer or skin breakdown.     Toenail Condition: Right toenails are abnormally thick. Fungal disease present.    Left foot:     Protective Sensation: 6 sites tested.  6 sites sensed.     Skin integrity: Dry skin present. No ulcer or skin breakdown.     Toenail Condition: Left toenails are abnormally thick. Fungal disease present. Neurological:     Mental Status: She is alert and oriented to person, place, and time.       Results for orders placed or performed in visit on  01/23/24  POCT glycosylated hemoglobin (Hb A1C)  Result Value Ref Range   Hemoglobin A1C 7.4 (A) 4.0 - 5.6 %    Assessment & Plan     Problem List Items Addressed This Visit       Cardiovascular and Mediastinum   Hypertension associated with diabetes (HCC) (Chronic)     Endocrine   Type 2 diabetes mellitus with hyperglycemia, with long-term current use of insulin (HCC) (Chronic)   Relevant Orders   POCT glycosylated hemoglobin (Hb A1C) (Completed)   Urine microalbumin-creatinine with uACR   Other Visit Diagnoses       Breast discharge    -  Primary   Relevant Orders   MM 3D DIAGNOSTIC MAMMOGRAM BILATERAL BREAST   US LIMITED ULTRASOUND INCLUDING AXILLA LEFT BREAST    [place] LIMITED ULTRASOUND INCLUDING AXILLA RIGHT BREAST     Daily headache       Relevant Orders   MR BRAIN WO CONTRAST   Ambulatory referral to Neurology     Chronic nonintractable headache, unspecified headache type       Relevant Orders   MR BRAIN WO CONTRAST   Ambulatory referral to Neurology     Fungal toenail infection       Relevant Orders   Ambulatory referral to Podiatry       Assessment & Plan Right-sided headache Intermittent right-sided headaches with paresthesia, non-throbbing, lasting five minutes, occurring daily, sometimes nocturnal. Frequency increased. Concern about scar tissue from previous neck surgery affecting nerves. Previous brain MRI unremarkable. Possible association with clonidine use, unconfirmed. Discussed potential pressure or inflammation. Options: brain MRI and neurology referral, or neurology referral first. - Order brain MRI - Refer to neurology  Breast discharge Right-sided breast discharge for months, variable color. Possible link to pH balance supplement, unconfirmed. No pain or palpable lumps, but small bleeding area noted on right areola (5 oclock position) as if healing from  a possible abrasion. Discussed diagnostic mammogram and ultrasound. - Order diagnostic  mammogram - Order breast ultrasound  Hypertension Blood pressure 147/70 mmHg. On clonidine 0.3 mg twice daily. Possible link to headaches, unconfirmed.  Diabetes mellitus Monitoring with A1c and urine albumin collected. Foot exam deferred for breast exam focus. 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 Previous podiatry consultation without treatment. Changes to left foot noted, plans to address infection. - Refer to podiatry  Follow-up Requires follow-up for conditions and imaging/referral results. - Schedule follow-up in 3 months - Schedule breast imaging - Schedule podiatry referral     Return in about 3 months (around 04/23/2024) for CHRONIC F/U.         Ronnald Ramp, MD  [facility] [phone] (phone) [phone] (fax)  [facility]

## 2024-01-24 ENCOUNTER — Other Ambulatory Visit: Payer: Self-pay | Admitting: Family Medicine

## 2024-01-24 DIAGNOSIS — K219 Gastro-esophageal reflux disease without esophagitis: Secondary | ICD-10-CM

## 2024-01-24 LAB — MICROALBUMIN / CREATININE URINE RATIO
Creatinine, Urine: 210.3 mg/dL
Microalb/Creat Ratio: 83 mg/g{creat} — ABNORMAL HIGH (ref 0–29)
Microalbumin, Urine: 175.5 ug/mL

## 2024-01-29 ENCOUNTER — Ambulatory Visit
Admission: RE | Admit: 2024-01-29 | Discharge: 2024-01-29 | Disposition: A | Discharge status: Home / Self Care | Source: Ambulatory Visit | Attending: Family Medicine | Admitting: Family Medicine

## 2024-01-29 DIAGNOSIS — R519 Headache, unspecified: Secondary | ICD-10-CM | POA: Diagnosis present

## 2024-01-29 DIAGNOSIS — G8929 Other chronic pain: Secondary | ICD-10-CM | POA: Diagnosis present

## 2024-02-06 ENCOUNTER — Other Ambulatory Visit: Payer: Self-pay | Admitting: Family Medicine

## 2024-02-06 DIAGNOSIS — K219 Gastro-esophageal reflux disease without esophagitis: Secondary | ICD-10-CM

## 2024-02-13 ENCOUNTER — Ambulatory Visit
Admission: RE | Admit: 2024-02-13 | Discharge: 2024-02-13 | Disposition: A | Discharge status: Home / Self Care | Source: Ambulatory Visit | Attending: Family Medicine | Admitting: Family Medicine

## 2024-02-13 ENCOUNTER — Ambulatory Visit
Admission: RE | Admit: 2024-02-13 | Discharge: 2024-02-13 | Disposition: A | Discharge status: Home / Self Care | Source: Ambulatory Visit | Attending: Family Medicine

## 2024-02-13 DIAGNOSIS — N6489 Other specified disorders of breast: Secondary | ICD-10-CM

## 2024-02-13 DIAGNOSIS — N6452 Nipple discharge: Secondary | ICD-10-CM | POA: Insufficient documentation

## 2024-02-13 DIAGNOSIS — N63 Unspecified lump in unspecified breast: Secondary | ICD-10-CM | POA: Insufficient documentation

## 2024-02-14 ENCOUNTER — Other Ambulatory Visit: Payer: Self-pay | Admitting: Family Medicine

## 2024-02-14 DIAGNOSIS — R928 Other abnormal and inconclusive findings on diagnostic imaging of breast: Secondary | ICD-10-CM

## 2024-02-21 ENCOUNTER — Other Ambulatory Visit

## 2024-02-21 ENCOUNTER — Encounter

## 2024-02-27 ENCOUNTER — Ambulatory Visit
Admission: RE | Admit: 2024-02-27 | Discharge: 2024-02-27 | Disposition: A | Discharge status: Home / Self Care | Source: Ambulatory Visit | Attending: Family Medicine

## 2024-02-27 ENCOUNTER — Ambulatory Visit: Payer: Self-pay | Admitting: Podiatry

## 2024-02-27 ENCOUNTER — Ambulatory Visit
Admission: RE | Admit: 2024-02-27 | Discharge: 2024-02-27 | Disposition: A | Discharge status: Home / Self Care | Source: Ambulatory Visit | Attending: Family Medicine | Admitting: Family Medicine

## 2024-02-27 DIAGNOSIS — R928 Other abnormal and inconclusive findings on diagnostic imaging of breast: Secondary | ICD-10-CM | POA: Insufficient documentation

## 2024-02-27 HISTORY — PX: BREAST BIOPSY: SHX20

## 2024-02-27 MED ORDER — LIDOCAINE-EPINEPHRINE 1 %-1:100000 IJ SOLN
5.0000 mL | Freq: Once | INTRAMUSCULAR | Status: AC
  Administered 2024-02-27: 5 mL
  Filled 2024-02-27: qty 5

## 2024-02-27 MED ORDER — LIDOCAINE-EPINEPHRINE 1 %-1:100000 IJ SOLN
8.0000 mL | Freq: Once | INTRAMUSCULAR | Status: AC
  Administered 2024-02-27: 8 mL
  Filled 2024-02-27: qty 8

## 2024-02-27 MED ORDER — LIDOCAINE HCL 1 % IJ SOLN
2.0000 mL | Freq: Once | INTRAMUSCULAR | Status: AC
  Administered 2024-02-27: 2 mL
  Filled 2024-02-27: qty 2

## 2024-02-27 MED ORDER — LIDOCAINE 1 % OPTIME INJ - NO CHARGE
2.0000 mL | Freq: Once | INTRAMUSCULAR | Status: AC
  Administered 2024-02-27: 2 mL
  Filled 2024-02-27: qty 2

## 2024-02-28 ENCOUNTER — Ambulatory Visit: Payer: Self-pay | Attending: Cardiology | Admitting: Cardiology

## 2024-02-28 ENCOUNTER — Encounter: Payer: Self-pay | Admitting: Cardiology

## 2024-02-28 ENCOUNTER — Ambulatory Visit: Admitting: Cardiology

## 2024-02-28 VITALS — BP 138/78 | HR 94 | Ht 66.0 in | Wt 217.2 lb

## 2024-02-28 DIAGNOSIS — R600 Localized edema: Secondary | ICD-10-CM | POA: Insufficient documentation

## 2024-02-28 DIAGNOSIS — R0789 Other chest pain: Secondary | ICD-10-CM | POA: Diagnosis not present

## 2024-02-28 DIAGNOSIS — I152 Hypertension secondary to endocrine disorders: Secondary | ICD-10-CM | POA: Diagnosis not present

## 2024-02-28 DIAGNOSIS — E1165 Type 2 diabetes mellitus with hyperglycemia: Secondary | ICD-10-CM

## 2024-02-28 DIAGNOSIS — Z794 Long term (current) use of insulin: Secondary | ICD-10-CM

## 2024-02-28 DIAGNOSIS — F419 Anxiety disorder, unspecified: Secondary | ICD-10-CM

## 2024-02-28 DIAGNOSIS — E1159 Type 2 diabetes mellitus with other circulatory complications: Secondary | ICD-10-CM | POA: Diagnosis not present

## 2024-02-28 NOTE — Assessment & Plan Note (Signed)
 Managed by PCP.  She is on NovoLog  and Ozempic  for diabetes, not on any medications for her lipids that are not well-controlled but have not been checked in a long time.  In the absence of any active CAD concerns, would hold off on being aggressive.  She is not interested in taking any additional medications.

## 2024-02-28 NOTE — Assessment & Plan Note (Signed)
 Anxiety noted, previously used alcohol as a substitute for medication. No alcohol use in past month. Avoiding substitutes for lorazepam  due to side effect concerns.

## 2024-02-28 NOTE — Assessment & Plan Note (Signed)
 Longstanding issue.  With her advanced age I think we simply just need to live with blood pressure range between 120s to 160s.  Where she to have extra high pressures over 160/90, I would recommend that she rechecks her blood pressure in an hour or 2 if still elevated, can take a dose of Lasix  or an additional dose of clonidine .  Otherwise I would simply leave her medications as they are.  For the most part she has been well-managed with clonidine  0.3 mg twice daily which she takes morning and afternoon, along with valsartan  160 mg twice daily.  She did not tolerate furosemide .Blood pressure 130-145 mmHg acceptable for age. Avoid aggressive control to prevent dizziness and falls.  I voiced my concerns of using clonidine  given her advanced age, but this is the only medicine she is tolerating. - Take clonidine  earlier if blood pressure is high and due for a dose in two hours. - Take extra clonidine  if blood pressure exceeds 160 mmHg and remains high after rechecking in an hour. - Avoid Lasix  in the evening to prevent nocturia.

## 2024-02-28 NOTE — Assessment & Plan Note (Signed)
 Previous chest pain episode attributed to anxiety and medication adjustments. No further episodes or testing needed unless symptoms recur.

## 2024-02-28 NOTE — Assessment & Plan Note (Signed)
 Managed with compression stockings. Lasix  causes dizziness and confusion, used only if blood pressure is very high with shortness of breath. - Use Lasix  if blood pressure is very high and shortness of breath is present, but avoid evening doses.

## 2024-02-28 NOTE — Patient Instructions (Signed)
 Medication Instructions:  Your Physician recommend you continue on your current medication as directed.    *If you need a refill on your cardiac medications before your next appointment, please call your pharmacy*  Follow-Up: At [facility], you and your health needs are our priority.  As part of our continuing mission to provide you with exceptional heart care, our providers are all part of one team.  This team includes your primary Cardiologist (physician) and Advanced Practice Providers or APPs (Physician Assistants and Nurse Practitioners) who all work together to provide you with the care you need, when you need it.  Your next appointment:   6 month(s)  Provider:   You may see Dr. Addie Holstein or one of the following Advanced Practice Providers on your designated Care Team:   Laneta Pintos, NP Gildardo Labrador, PA-C Varney Gentleman, PA-C Cad[facility], PA-C [name], NP Morey Ar, NP    We recommend signing up for the patient portal called "MyChart".  Sign up information is provided on this After Visit Summary.  MyChart is used to connect with patients for Virtual Visits (Telemedicine).  Patients are able to view lab/test results, encounter notes, upcoming appointments, etc.  Non-urgent messages can be sent to your provider as well.   To learn more about what you can do with MyChart, go to [email or web address].

## 2024-02-28 NOTE — Progress Notes (Signed)
 Cardiology Office Note:  .   Date:  02/28/2024  ID:  Vanessa Hogan, DOB [birth year], MRN [id number] PCP: Vanessa Hogan  [facility] Providers Cardiologist:  None     No chief complaint on file.   Patient Profile: .     Vanessa Hogan is a moderately obese 83 y.o. female with a PMH notable for DM-2, HTN (with mild hypertensive heart disease), HLD who presents here for 6-week follow-up (implanted midodrine 85-month) at the request of Vanessa Hogan, [facility]*.    I last saw Vanessa Hogan in January 2023 for follow-up of atypical chest pain.  She was having issues with blood pressure but have been somewhat stable on clonidine  and Diovan  HCTZ with the HCTZ having subsequently been discontinued.  She has not tolerated many other options.  No further chest pain episodes.  Referred for further management to PCP.  She was then seen by Dr. Parks Hogan from the [facility] 3 times in 2024-initially as a ER follow-up from July 2023 evaluation for chest pain.  The initial visit was actually following a presyncopal episode (01/28/2023) while at church-noted to be her blood pressure was 70/50.  She was also noted to be hypokalemic.  Likely related to dehydration, UTI.  Her last visit with Dr. Parks Hogan was on July 09, 2023 -she was doing well.  Mild exertional dyspnea.  No palpitations.  No edema.  Activity no exercise.  13 pound weight loss.  No changes made.  She went to the ER on October 15 for elevated blood pressure as high as 190/73.  Apparently she had stopped taking her clonidine  as a twice daily medicine and was taking only once daily.  She was then seen by Vanessa Gennaro Khat, PA, PA back at our [facility] and the consideration was to switch from HCTZ to Lasix .  Hydralazine  started (but was not tolerated) with an attempt to reduce clonidine  to 0.1 mg 3 times daily => Follow-up in January 2025 with Vanessa Furth, PA-unable to tolerate  hydralazine -so she restarted clonidine  blood pressures are normal to high at home. => Hydralazine  changed for Lasix .  Valsartan  and clonidine  0.2 mg twice daily continued.  She was then seen on December 27, 2023 for follow-up after ER visit on March 2 with chest pain and dyspnea with elevated blood pressures.  Her pressures are in the 170s over 90s (EDP increased from 0.2 to 0.3 mg of clonidine  twice daily).  => At that time she was seen by Vanessa Cockayne, NP-=> was having issues with Lasix  due to fatigue and dizziness.  Recommended PRN usage along with compression stockings.  Subjective  Discussed the use of AI scribe software for clinical note transcription with the patient, who gave verbal consent to proceed.  History of Present Illness Vanessa Hogan is an 83 year old female with hypertension and diabetes who presents for follow-up after an emergency room visit.  She recently underwent a bilateral breast biopsy and is awaiting results. She reports no pain from the procedure.  She has a history of hypertension, currently managed with clonidine  0.3 mg twice daily, valsartan  160 mg twice daily, and occasional furosemide  for swelling. She experiences dizziness and confusion with furosemide , so it is used sparingly. Blood pressure readings have been between 130 and 145 mmHg. She is concerned about the effectiveness of her current medications, noting that clonidine  and desirudin were previously effective.  Her diabetes is managed with Novolog , and she reports stable blood sugar levels.  She experiences occasional  dizziness, particularly when standing, with a significant episode occurring while showering. No orthopnea or paroxysmal nocturnal dyspnea is noted, and she sleeps on two pillows.  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  She has a history of anxiety and previously used  lorazepam , which was discontinued due to her age. She has not taken substitute medications due to concerns about side effects and has occasionally used alcohol to manage anxiety, though she has not consumed alcohol in the past month.  Her diet has been influenced by her husband's preference for fried foods, leading to weight gain. She acknowledges the need to change her eating habits.   Objective   Medications - Clonidine  0.3 mg twice a day; - Valsartan  160 mg twice a day - Furosemide  as needed for swelling (not taking due to extreme dizziness, fatigue and near syncope) - Novolog , 70-30 for diabetes - Ozempic  0.25 mg weekly with plans to increase to 0.5 mg. -Protonix  40 mg daily   Studies Reviewed: Vanessa Hogan   EKG Interpretation Date/Time:  Thursday Feb 28 2024 08:56:10 EDT Ventricular Rate:  94 PR Interval:  244 QRS Duration:  136 QT Interval:  404 QTC Calculation: 505 R Axis:   -33  Text Interpretation: Sinus rhythm with 1st degree A-V block with occasional Premature ventricular complexes Left axis deviation Right bundle branch block When compared with ECG of 27-Dec-2023 10:56, Premature ventricular complexes are now Present Criteria for Inferior infarct are no longer Present Confirmed by Vanessa Hogan ([id number]) on 02/28/2024 9:04:07 AM    ECHO 10/29/2023: NORMAL ECHO.  LVEF 60 to 65%.  No RWMA.  GR 1 DD.  Normal RV.  Normal MV and AoV.  Normal RAP.   72-hour monitor October 2024 sinus rhythm with rate range 47 to 100 bpm and average 66 bpm.  1 episode of secondary leg block with rare PACs and PVCs.    Lab Results  Component Value Date   NA 139 12/23/2023   K 3.6 12/23/2023   CREATININE 0.95 12/23/2023   GFRNONAA 60 (L) 12/23/2023   GLUCOSE 93 12/23/2023   Lab Results  Component Value Date   CHOL 204 (H) 12/28/2021   HDL 38 (L) 12/28/2021   LDLCALC 137 (H) 12/28/2021   TRIG 161 (H) 12/28/2021   CHOLHDL 5.4 (H) 12/28/2021   Risk Assessment/Calculations:          Physical Exam:    VS:  BP 138/78   Pulse 94   Ht 5\' 6"  (1.676 m)   Wt 217 lb 3.2 oz (98.5 kg)   SpO2 98%   BMI 35.06 kg/m    Wt Readings from Last 3 Encounters:  02/28/24 217 lb 3.2 oz (98.5 kg)  01/23/24 213 lb (96.6 kg)  12/27/23 214 lb 12.8 oz (97.4 kg)    GEN: Well nourished, well groomed in no acute distress; moderately obese but otherwise healthy-appearing NECK: No JVD; No carotid bruits CARDIAC: RRR with occasional ectopy, distant, but normal S1, S2; no murmurs, rubs, gallops RESPIRATORY:  Clear to auscultation without rales, wheezing or rhonchi ; nonlabored, good air movement. ABDOMEN: Soft, non-tender, non-distended EXTREMITIES:  No edema; No deformity     ASSESSMENT AND PLAN: .    Problem List Items Addressed This Visit       Cardiology Problems   Hypertension associated with type 2 diabetes mellitus (HCC) - Primary (Chronic)   Longstanding issue.  With her advanced age  I think we simply just need to live with blood pressure range between 120s to 160s.  Where she to have extra high pressures over 160/90, I would recommend that she rechecks her blood pressure in an hour or 2 if still elevated, can take a dose of Lasix  or an additional dose of clonidine .  Otherwise I would simply leave her medications as they are.  For the most part she has been well-managed with clonidine  0.3 mg twice daily which she takes morning and afternoon, along with valsartan  160 mg twice daily.  She did not tolerate furosemide .Blood pressure 130-145 mmHg acceptable for age. Avoid aggressive control to prevent dizziness and falls.  I voiced my concerns of using clonidine  given her advanced age, but this is the only medicine she is tolerating. - Take clonidine  earlier if blood pressure is high and due for a dose in two hours. - Take extra clonidine  if blood pressure exceeds 160 mmHg and remains high after rechecking in an hour. - Avoid Lasix  in the evening to prevent nocturia.      Relevant Orders   EKG 12-Lead  (Completed)     Other   Anxiety (Chronic)   Anxiety noted, previously used alcohol as a substitute for medication. No alcohol use in past month. Avoiding substitutes for lorazepam  due to side effect concerns.      Bilateral lower extremity edema (Chronic)   Managed with compression stockings. Lasix  causes dizziness and confusion, used only if blood pressure is very high with shortness of breath. - Use Lasix  if blood pressure is very high and shortness of breath is present, but avoid evening doses.      Intermittent right-sided chest pain (Chronic)   Previous chest pain episode attributed to anxiety and medication adjustments. No further episodes or testing needed unless symptoms recur.      Type 2 diabetes mellitus with hyperglycemia, with long-term current use of insulin  (HCC) (Chronic)   Managed by PCP.  She is on NovoLog  and Ozempic  for diabetes, not on any medications for her lipids that are not well-controlled but have not been checked in a long time.  In the absence of any active CAD concerns, would hold off on being aggressive.  She is not interested in taking any additional medications.             Follow-Up: Return in about 6 months (around 08/30/2024) for [place] office.  I spent 44 minutes in the care of [facility] today including reviewing labs (3 minutes trying to find labs to review.), reviewing studies (studies from [facility] as well as [facility] reviewed-6 minutes), face to face time discussing treatment options (20 minutes), reviewing records from several ER visits, 3 notes by Dr. Parks Hogan, 2 notes by Toribio Frees, PA and 1 note by Vanessa Cockayne, NP  (8 min), 7, and documenting in the encounter.       Signed, Arleen Lacer, MD, MS Vanessa Hogan, M.D., M.S. Interventional Chartered certified accountant  Pager # [phone]

## 2024-02-29 LAB — SURGICAL PATHOLOGY

## 2024-03-03 ENCOUNTER — Encounter: Payer: Self-pay | Admitting: *Deleted

## 2024-03-03 DIAGNOSIS — C50919 Malignant neoplasm of unspecified site of unspecified female breast: Secondary | ICD-10-CM

## 2024-03-03 NOTE — Progress Notes (Signed)
 Received referral for newly diagnosed breast cancer from [facility].  Navigation initiated.  Spoke with Vanessa Hogan and she would like to meet with medical oncology only at this time.  I have not been able to reach Ms. Pen back with appointment options.  I have attempted to call her back multiple times, voice mail is not set up.

## 2024-03-04 ENCOUNTER — Encounter: Payer: Self-pay | Admitting: *Deleted

## 2024-03-04 NOTE — Progress Notes (Signed)
 Ms. Rowin will see Dr. Wilhelmenia Harada on 5/19 at 11:15 (patient preference).   She would like to speak with Dr. Wilhelmenia Harada prior to scheduling a consultation with a surgeon.

## 2024-03-06 ENCOUNTER — Ambulatory Visit: Payer: Medicare HMO | Admitting: Medical

## 2024-03-10 ENCOUNTER — Encounter: Payer: Self-pay | Admitting: *Deleted

## 2024-03-10 ENCOUNTER — Encounter: Payer: Self-pay | Admitting: Oncology

## 2024-03-10 ENCOUNTER — Inpatient Hospital Stay

## 2024-03-10 ENCOUNTER — Inpatient Hospital Stay: Attending: Oncology | Admitting: Oncology

## 2024-03-10 VITALS — BP 174/67 | HR 82 | Temp 98.6°F | Resp 18 | Wt 215.0 lb

## 2024-03-10 DIAGNOSIS — Z885 Allergy status to narcotic agent status: Secondary | ICD-10-CM | POA: Diagnosis not present

## 2024-03-10 DIAGNOSIS — I10 Essential (primary) hypertension: Secondary | ICD-10-CM | POA: Diagnosis not present

## 2024-03-10 DIAGNOSIS — D241 Benign neoplasm of right breast: Secondary | ICD-10-CM | POA: Insufficient documentation

## 2024-03-10 DIAGNOSIS — C50811 Malignant neoplasm of overlapping sites of right female breast: Secondary | ICD-10-CM | POA: Insufficient documentation

## 2024-03-10 DIAGNOSIS — Z800 Family history of malignant neoplasm of digestive organs: Secondary | ICD-10-CM | POA: Insufficient documentation

## 2024-03-10 DIAGNOSIS — E119 Type 2 diabetes mellitus without complications: Secondary | ICD-10-CM | POA: Diagnosis not present

## 2024-03-10 DIAGNOSIS — Z1721 Progesterone receptor positive status: Secondary | ICD-10-CM | POA: Insufficient documentation

## 2024-03-10 DIAGNOSIS — Z880 Allergy status to penicillin: Secondary | ICD-10-CM | POA: Diagnosis not present

## 2024-03-10 DIAGNOSIS — Z818 Family history of other mental and behavioral disorders: Secondary | ICD-10-CM | POA: Diagnosis not present

## 2024-03-10 DIAGNOSIS — Z833 Family history of diabetes mellitus: Secondary | ICD-10-CM | POA: Insufficient documentation

## 2024-03-10 DIAGNOSIS — Z87891 Personal history of nicotine dependence: Secondary | ICD-10-CM | POA: Diagnosis not present

## 2024-03-10 DIAGNOSIS — F419 Anxiety disorder, unspecified: Secondary | ICD-10-CM | POA: Diagnosis not present

## 2024-03-10 DIAGNOSIS — Z886 Allergy status to analgesic agent status: Secondary | ICD-10-CM | POA: Insufficient documentation

## 2024-03-10 DIAGNOSIS — Z79899 Other long term (current) drug therapy: Secondary | ICD-10-CM | POA: Diagnosis not present

## 2024-03-10 DIAGNOSIS — Z1732 Human epidermal growth factor receptor 2 negative status: Secondary | ICD-10-CM | POA: Insufficient documentation

## 2024-03-10 DIAGNOSIS — Z823 Family history of stroke: Secondary | ICD-10-CM | POA: Diagnosis not present

## 2024-03-10 DIAGNOSIS — I2510 Atherosclerotic heart disease of native coronary artery without angina pectoris: Secondary | ICD-10-CM | POA: Diagnosis not present

## 2024-03-10 DIAGNOSIS — I700 Atherosclerosis of aorta: Secondary | ICD-10-CM | POA: Diagnosis not present

## 2024-03-10 DIAGNOSIS — N6323 Unspecified lump in the left breast, lower outer quadrant: Secondary | ICD-10-CM | POA: Diagnosis not present

## 2024-03-10 DIAGNOSIS — N6041 Mammary duct ectasia of right breast: Secondary | ICD-10-CM | POA: Insufficient documentation

## 2024-03-10 DIAGNOSIS — Z170 Estrogen receptor positive status [ER+]: Secondary | ICD-10-CM | POA: Diagnosis not present

## 2024-03-10 DIAGNOSIS — C50919 Malignant neoplasm of unspecified site of unspecified female breast: Secondary | ICD-10-CM | POA: Insufficient documentation

## 2024-03-10 DIAGNOSIS — N6325 Unspecified lump in the left breast, overlapping quadrants: Secondary | ICD-10-CM | POA: Insufficient documentation

## 2024-03-10 DIAGNOSIS — Z8249 Family history of ischemic heart disease and other diseases of the circulatory system: Secondary | ICD-10-CM | POA: Diagnosis not present

## 2024-03-10 NOTE — Progress Notes (Signed)
 Accompanied patient and family to initial medical oncology appointment.   Reviewed Breast Cancer treatment handbook.   Care plan summary given to patient.   Reviewed outreach programs and cancer center services. Referral faxed to [facility] breast clinic for 2nd opinion per patient request.   They will call her to set up the appointment.

## 2024-03-10 NOTE — Assessment & Plan Note (Signed)
 With ADH features.  Recommend surgical resection.

## 2024-03-10 NOTE — Progress Notes (Addendum)
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 multifocal bilateral disease, pretreatment breast MRI with and without contrast would be recommended if breast conservation surgery is being considered. Pathology results reported by Ladonna Pickup RN on 02/29/2024. Electronically Signed   By: Clancy Crimes M.D.   On: 03/01/2024 10:21   Result Date: 03/01/2024 CLINICAL DATA:  Indeterminate bilateral breast masses EXAM: ULTRASOUND GUIDED RIGHT BREAST CORE NEEDLE BIOPSY x3 ULTRASOUND GUIDED LEFT BREAST CORE NEEDLE BIOPSY COMPARISON:  Previous exam(s). PROCEDURE: I met with the patient and we discussed the procedure of ultrasound-guided biopsy, including benefits and alternatives. We discussed the high likelihood of a successful procedure. We discussed the risks of the procedure, including infection, bleeding, tissue injury, clip migration, and inadequate sampling. Informed written consent was given. The usual time-out protocol was performed immediately  prior to the procedure. RIGHT breast: Site 1: RIGHT breast 9 o'clock 12 cm from the nipple Lesion quadrant: Upper outer quadrant Using sterile technique and 1% lidocaine  and 1% lidocaine  with epinephrine  as local anesthetic, under direct ultrasound visualization, a 14 gauge spring-loaded device was used to perform biopsy of a mass at 9 o'clock 12 cm from the nipple using an inferior approach. At the conclusion of the procedure a venus shaped tissue marker clip was deployed into the biopsy cavity. Follow up 2 view mammogram was performed and dictated separately. Site 2: RIGHT breast 9 o'clock 10 cm from the nipple Lesion quadrant: Upper outer quadrant Using sterile technique and 1% lidocaine  and 1% lidocaine  with epinephrine  as local anesthetic, under direct ultrasound visualization, a 14 gauge spring-loaded device was used to perform biopsy of a mass at 9 o'clock 10 cm from the nipple using an inferior approach. At the conclusion of the procedure a COIL shaped tissue marker clip was deployed into the biopsy cavity. Follow up 2 view mammogram was performed and dictated separately. Site 3: RIGHT breast 3 o'clock retroareolar breast Lesion quadrant: Upper inner quadrant Using sterile technique and 1% lidocaine  and 1% lidocaine  with epinephrine  as local anesthetic, under direct ultrasound visualization, a 14 gauge spring-loaded device was used to perform biopsy of a mass in the retroareolar breast at 3 o'clock using a medial approach. At the conclusion of the procedure a RIBBON shaped tissue marker clip was deployed into the biopsy cavity. Follow up 2 view mammogram was performed and dictated separately. LEFT breast: Site 4: LEFT breast 4 o'clock 8 cm from the nipple Lesion quadrant: Lower outer quadrant Using sterile technique and 1% lidocaine  and 1% lidocaine  with epinephrine  as local anesthetic, under direct ultrasound visualization, a 14 gauge spring-loaded device was used to perform biopsy of a mass at 4 o'clock 8  cm from the nipple using a lateral approach. At the conclusion of the procedure a heart shaped tissue marker clip was deployed into the biopsy cavity. Follow up 2 view mammogram was performed and dictated separately. IMPRESSION: Ultrasound guided biopsy of 3 RIGHT breast masses and 1 LEFT breast mass as described above. No apparent complications. Electronically Signed: By: Clancy Crimes M.D. On: 02/27/2024 10:05   US  RT BREAST BX W LOC DEV EA ADD LESION IMG BX SPEC US  GUIDE Addendum Date: 03/01/2024 ADDENDUM REPORT: 03/01/2024 10:21 ADDENDUM: PATHOLOGY revealed: 1. Breast, right, needle core biopsy, 9 o'clock, 12cmfn, venus : - INVASIVE MAMMARY CARCINOMA  WITH MUCINOUS FEATURES. - OVERALL GRADE: II/III - LYMPHOVASCULAR INVASION: NOT IDENTIFIED - CANCER LENGTH: 9 MM IN GREATEST LINEAR DIMENSION - CALCIFICATIONS: NOT IDENTIFIED. Pathology results are CONCORDANT with imaging findings, per Clancy Crimes. PATHOLOGY revealed: Site 2. Breast, right, needle core biopsy, 9 o'clock, 10cmfn, coil clip : - PAPILLARY NEOPLASM, NUCLEAR GRADE 2 OF 3 (THE DIFFERENTIAL DIAGNOSIS INCLUDES PAPILLARY DCIS, ENCAPSULATED PAPILLARY CARCINOMA AND LESS LIKELY SOLID PAPILLARY CARCINOMA) CLASSIFICATION SPECIMEN 8 ON THE EXCISIONAL SPECIMEN - NECROSIS: SINGLE CELL NECROSIS - CALCIFICATIONS: NOT IDENTIFIED - DCIS LENGTH: 6 MM IN GREATEST LINEAR DIMENSION. - NOTE: MYOEPITHELIAL STAINS (CALPONIN, P63 AND SMOOTH MUSCLE MYOSIN) SHOW PREDOMINANTLY RETAINED MYOEPITHELIAL CELLS. Pathology results are CONCORDANT with imaging findings, per Dr Clancy Crimes. PATHOLOGY revealed: Site 3. 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 NOTE: THERE IS LOSS OF CK5/6 AS WELL AS CLONAL ER TYPE STAINING CONSISTENT WITH ADH AS OPPOSED TO USUAL DUCTAL HYPERPLASIA. Pathology results are CONCORDANT with imaging findings, per Dr Clancy Crimes. PATHOLOGY revealed: Site 4. Breast, left, needle core biopsy, 4 o'clock, 8cmfn, heart clip : - PAPILLARY NEOPLASM, NUCLEAR GRADE 2 OF 3 (THE DIFFERENTIAL DIAGNOSIS INCLUDES PAPILLARY DCIS (FAVORED), ENCAPSULATED PAPILLARY CARCINOMA AND LESS LIKELY SOLID PAPILLARY CARCINOMA) CLASSIFICATION SPECIMEN 8 ON THE EXCISIONAL SPECIMEN - NECROSIS: SINGLE CELL NECROSIS - CALCIFICATIONS: NOT IDENTIFIED - DCIS LENGTH: 8 MM IN GREATEST LINEAR DIMENSION NOTE: MYOEPITHELIAL STAINS (CALPONIN, P63 AND SMOOTH MUSCLE MYOSIN) SHOW PREDOMINANTLY RETAINED MYOEPITHELIAL CELLS. Pathology results are CONCORDANT with imaging findings, per Dr Clancy Crimes. Pathology results and recommendations below were discussed with patient by telephone on 02/29/2024 by Ladonna Pickup RN. Patient reported biopsy site within normal limits with slight tenderness at the site. Post biopsy care instructions were reviewed, questions were answered and my direct phone number was provided to patient. Patient was instructed to call [facility] if any concerns or questions arise related to the biopsy. RECOMMENDATIONS: 1. Surgical and oncological consultation for all sites. Request for surgical and oncological consultation relayed to Gwenn Lenz RN at [facility] by Ladonna Pickup RN on 02/29/2024. 2. Recommend additional LEFT breast ultrasound guided biopsy of mass at 4 o'clock position 7 cm fn if breast conservation on LEFT breast being pursued. 3. Papilloma with ADH (site 3) is over 12.4 cm apart from [facility] (site 1). Site 1 and 2 span at least 3 cm. Given multifocal bilateral disease, pretreatment breast MRI with and without contrast would be recommended if breast conservation surgery is being considered. Pathology results reported by Ladonna Pickup RN on 02/29/2024. Electronically Signed   By: Clancy Crimes M.D.   On: 03/01/2024 10:21   Result Date: 03/01/2024 CLINICAL DATA:  Indeterminate bilateral breast masses EXAM: ULTRASOUND  GUIDED RIGHT BREAST CORE NEEDLE BIOPSY x3 ULTRASOUND GUIDED LEFT BREAST CORE NEEDLE BIOPSY COMPARISON:  Previous exam(s). PROCEDURE: I met with the patient and we discussed the procedure of ultrasound-guided biopsy, including benefits and alternatives. We discussed the high likelihood of a successful procedure. We discussed the risks of the procedure, including infection, bleeding, tissue injury, clip migration, and inadequate sampling. Informed written consent was given. The usual time-out protocol was performed immediately prior to the procedure. RIGHT breast: Site 1: RIGHT breast 9 o'clock 12 cm from the nipple Lesion quadrant: Upper outer quadrant Using sterile technique and 1% lidocaine  and 1% lidocaine  with epinephrine  as local anesthetic, under direct ultrasound visualization, a 14 gauge spring-loaded device was used to  perform biopsy of a mass at 9 o'clock 12 cm from the nipple using an inferior approach. At the conclusion of the procedure a venus shaped tissue marker clip was deployed into the biopsy cavity. Follow up 2 view mammogram was performed and dictated separately. Site 2: RIGHT breast 9 o'clock 10 cm from the nipple Lesion quadrant: Upper outer quadrant Using sterile technique and 1% lidocaine  and 1% lidocaine  with epinephrine  as local anesthetic, under direct ultrasound visualization, a 14 gauge spring-loaded device was used to perform biopsy of a mass at 9 o'clock 10 cm from the nipple using an inferior approach. At the conclusion of the procedure a COIL shaped tissue marker clip was deployed into the biopsy cavity. Follow up 2 view mammogram was performed and dictated separately. Site 3: RIGHT breast 3 o'clock retroareolar breast Lesion quadrant: Upper inner quadrant Using sterile technique and 1% lidocaine  and 1% lidocaine  with epinephrine  as local anesthetic, under direct ultrasound visualization, a 14 gauge spring-loaded device was used to perform biopsy of a mass in the retroareolar breast  at 3 o'clock using a medial approach. At the conclusion of the procedure a RIBBON shaped tissue marker clip was deployed into the biopsy cavity. Follow up 2 view mammogram was performed and dictated separately. LEFT breast: Site 4: LEFT breast 4 o'clock 8 cm from the nipple Lesion quadrant: Lower outer quadrant Using sterile technique and 1% lidocaine  and 1% lidocaine  with epinephrine  as local anesthetic, under direct ultrasound visualization, a 14 gauge spring-loaded device was used to perform biopsy of a mass at 4 o'clock 8 cm from the nipple using a lateral approach. At the conclusion of the procedure a heart shaped tissue marker clip was deployed into the biopsy cavity. Follow up 2 view mammogram was performed and dictated separately. IMPRESSION: Ultrasound guided biopsy of 3 RIGHT breast masses and 1 LEFT breast mass as described above. No apparent complications. Electronically Signed: By: Clancy Crimes M.D. On: 02/27/2024 10:05   US  LT BREAST BX W LOC DEV EA ADD LESION IMG BX SPEC US  GUIDE Addendum Date: 03/01/2024 ADDENDUM REPORT: 03/01/2024 10:21 ADDENDUM: PATHOLOGY revealed: 1. Breast, right, needle core biopsy, 9 o'clock, 12cmfn, venus : - INVASIVE MAMMARY CARCINOMA WITH MUCINOUS FEATURES. - OVERALL GRADE: II/III - LYMPHOVASCULAR INVASION: NOT IDENTIFIED - CANCER LENGTH: 9 MM IN GREATEST LINEAR DIMENSION - CALCIFICATIONS: NOT IDENTIFIED. Pathology results are CONCORDANT with imaging findings, per Clancy Crimes. PATHOLOGY revealed: Site 2. Breast, right, needle core biopsy, 9 o'clock, 10cmfn, coil clip : - PAPILLARY NEOPLASM, NUCLEAR GRADE 2 OF 3 (THE DIFFERENTIAL DIAGNOSIS INCLUDES PAPILLARY DCIS, ENCAPSULATED PAPILLARY CARCINOMA AND LESS LIKELY SOLID PAPILLARY CARCINOMA) CLASSIFICATION SPECIMEN 8 ON THE EXCISIONAL SPECIMEN - NECROSIS: SINGLE CELL NECROSIS - CALCIFICATIONS: NOT IDENTIFIED - DCIS LENGTH: 6 MM IN GREATEST LINEAR DIMENSION. - NOTE: MYOEPITHELIAL STAINS (CALPONIN, P63 AND SMOOTH  MUSCLE MYOSIN) SHOW PREDOMINANTLY RETAINED MYOEPITHELIAL CELLS. Pathology results are CONCORDANT with imaging findings, per Dr Clancy Crimes. PATHOLOGY revealed: Site 3. 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 NOTE: THERE IS LOSS OF CK5/6 AS WELL AS CLONAL ER TYPE STAINING CONSISTENT WITH ADH AS OPPOSED TO USUAL DUCTAL HYPERPLASIA. Pathology results are CONCORDANT with imaging findings, per Dr Clancy Crimes. PATHOLOGY revealed: Site 4. Breast, left, needle core biopsy, 4 o'clock, 8cmfn, heart clip : - PAPILLARY NEOPLASM, NUCLEAR GRADE 2 OF 3 (THE DIFFERENTIAL DIAGNOSIS INCLUDES PAPILLARY DCIS (FAVORED), ENCAPSULATED PAPILLARY CARCINOMA AND LESS LIKELY SOLID PAPILLARY CARCINOMA)  CLASSIFICATION SPECIMEN 8 ON THE EXCISIONAL SPECIMEN - NECROSIS: SINGLE CELL NECROSIS - CALCIFICATIONS: NOT IDENTIFIED - DCIS LENGTH: 8 MM IN GREATEST LINEAR DIMENSION NOTE: MYOEPITHELIAL STAINS (CALPONIN, P63 AND SMOOTH MUSCLE MYOSIN) SHOW PREDOMINANTLY RETAINED MYOEPITHELIAL CELLS. Pathology results are CONCORDANT with imaging findings, per Dr Clancy Crimes. Pathology results and recommendations below were discussed with patient by telephone on 02/29/2024 by Ladonna Pickup RN. Patient reported biopsy site within normal limits with slight tenderness at the site. Post biopsy care instructions were reviewed, questions were answered and my direct phone number was provided to patient. Patient was instructed to call [facility] if any concerns or questions arise related to the biopsy. RECOMMENDATIONS: 1. Surgical and oncological consultation for all sites. Request for surgical and oncological consultation relayed to Gwenn Lenz RN at [facility] by Ladonna Pickup RN on 02/29/2024. 2. Recommend additional LEFT breast ultrasound guided biopsy of mass at 4 o'clock position 7 cm fn if breast conservation  on LEFT breast being pursued. 3. Papilloma with ADH (site 3) is over 12.4 cm apart from [facility] (site 1). Site 1 and 2 span at least 3 cm. Given multifocal bilateral disease, pretreatment breast MRI with and without contrast would be recommended if breast conservation surgery is being considered. Pathology results reported by Ladonna Pickup RN on 02/29/2024. Electronically Signed   By: Clancy Crimes M.D.   On: 03/01/2024 10:21   Result Date: 03/01/2024 CLINICAL DATA:  Indeterminate bilateral breast masses EXAM: ULTRASOUND GUIDED RIGHT BREAST CORE NEEDLE BIOPSY x3 ULTRASOUND GUIDED LEFT BREAST CORE NEEDLE BIOPSY COMPARISON:  Previous exam(s). PROCEDURE: I met with the patient and we discussed the procedure of ultrasound-guided biopsy, including benefits and alternatives. We discussed the high likelihood of a successful procedure. We discussed the risks of the procedure, including infection, bleeding, tissue injury, clip migration, and inadequate sampling. Informed written consent was given. The usual time-out protocol was performed immediately prior to the procedure. RIGHT breast: Site 1: RIGHT breast 9 o'clock 12 cm from the nipple Lesion quadrant: Upper outer quadrant Using sterile technique and 1% lidocaine  and 1% lidocaine  with epinephrine  as local anesthetic, under direct ultrasound visualization, a 14 gauge spring-loaded device was used to perform biopsy of a mass at 9 o'clock 12 cm from the nipple using an inferior approach. At the conclusion of the procedure a venus shaped tissue marker clip was deployed into the biopsy cavity. Follow up 2 view mammogram was performed and dictated separately. Site 2: RIGHT breast 9 o'clock 10 cm from the nipple Lesion quadrant: Upper outer quadrant Using sterile technique and 1% lidocaine  and 1% lidocaine  with epinephrine  as local anesthetic, under direct ultrasound visualization, a 14 gauge spring-loaded device was used to perform biopsy of a mass at 9 o'clock 10 cm from the  nipple using an inferior approach. At the conclusion of the procedure a COIL shaped tissue marker clip was deployed into the biopsy cavity. Follow up 2 view mammogram was performed and dictated separately. Site 3: RIGHT breast 3 o'clock retroareolar breast Lesion quadrant: Upper inner quadrant Using sterile technique and 1% lidocaine  and 1% lidocaine  with epinephrine  as local anesthetic, under direct ultrasound visualization, a 14 gauge spring-loaded device was used to perform biopsy of a mass in the retroareolar breast at 3 o'clock using a medial approach. At the conclusion of the procedure a RIBBON shaped tissue marker clip was deployed into the biopsy cavity. Follow up 2 view mammogram was performed and dictated separately. LEFT breast: Site 4: LEFT breast 4  o'clock 8 cm from the nipple Lesion quadrant: Lower outer quadrant Using sterile technique and 1% lidocaine  and 1% lidocaine  with epinephrine  as local anesthetic, under direct ultrasound visualization, a 14 gauge spring-loaded device was used to perform biopsy of a mass at 4 o'clock 8 cm from the nipple using a lateral approach. At the conclusion of the procedure a heart shaped tissue marker clip was deployed into the biopsy cavity. Follow up 2 view mammogram was performed and dictated separately. IMPRESSION: Ultrasound guided biopsy of 3 RIGHT breast masses and 1 LEFT breast mass as described above. No apparent complications. Electronically Signed: By: Clancy Crimes M.D. On: 02/27/2024 10:05   MM CLIP PLACEMENT RIGHT Result Date: 02/27/2024 CLINICAL DATA:  Status post 3 site RIGHT breast biopsy EXAM: 3D DIAGNOSTIC RIGHT MAMMOGRAM POST ULTRASOUND BIOPSY COMPARISON:  Previous exam(s). ACR Breast Density Category b: There are scattered areas of fibroglandular density. FINDINGS: 3D Mammographic images were obtained following ultrasound guided biopsy of mass at 9 o'clock 12 cm from the nipple. The the venus biopsy marking clip is in expected position at  the site of biopsy. 3D Mammographic images were obtained following ultrasound guided biopsy of 9 o'clock 10 cm from the nipple. The COIL biopsy marking clip is in expected position at the site of biopsy. 3D Mammographic images were obtained following ultrasound guided biopsy of retroareolar breast at 3 o'clock. The RIBBON biopsy marking clip is in expected position at the site of biopsy. IMPRESSION: 1. Appropriate positioning of the venus shaped biopsy marking clip at the site of biopsy in the upper outer breast at 9 o'clock 12 cm from the nipple. 2. Appropriate positioning of the COIL shaped biopsy marking clip at the site of biopsy in the upper outer breast at 9 o'clock 10 cm from nipple. 3. Appropriate positioning of the RIBBON shaped biopsy marking clip at the site of biopsy in the retroareolar breast at 3 o'clock. Final Assessment: Post Procedure Mammograms for Marker Placement Electronically Signed   By: Clancy Crimes M.D.   On: 02/27/2024 10:06   MM CLIP PLACEMENT LEFT Result Date: 02/27/2024 CLINICAL DATA:  Status post ultrasound-guided biopsy EXAM: 3D DIAGNOSTIC LEFT MAMMOGRAM POST ULTRASOUND BIOPSY COMPARISON:  Previous exam(s). ACR Breast Density Category b: There are scattered areas of fibroglandular density. FINDINGS: 3D Mammographic images were obtained following ultrasound guided biopsy of a LEFT breast mass at 4 o'clock. The heart biopsy marking clip is in expected position at the site of biopsy. IMPRESSION: Appropriate positioning of the heart shaped biopsy marking clip at the site of biopsy in the outer breast. Final Assessment: Post Procedure Mammograms for Marker Placement Electronically Signed   By: Clancy Crimes M.D.   On: 02/27/2024 10:00   MM 3D DIAGNOSTIC MAMMOGRAM BILATERAL BREAST Result Date: 02/13/2024 CLINICAL DATA:  Patient was recalled for a mass in the right breast and asymmetry in the left breast new baseline screening mammogram performed July 2024; delayed initial  evaluation. Patient also reports spontaneous dark brown right nipple discharge for a few weeks. EXAM: DIGITAL DIAGNOSTIC BILATERAL MAMMOGRAM WITH TOMOSYNTHESIS AND CAD; ULTRASOUND RIGHT BREAST LIMITED; ULTRASOUND LEFT BREAST LIMITED TECHNIQUE: Bilateral digital diagnostic mammography and breast tomosynthesis was performed. The images were evaluated with computer-aided detection. ; Targeted ultrasound examination of the right breast was performed; Targeted ultrasound examination of the left breast was performed. COMPARISON:  Screening mammogram 05/09/2023 ACR Breast Density Category b: There are scattered areas of fibroglandular density. FINDINGS: RIGHT breast: The previously noted possible mass in the outer central  right breast persists on additional views as a oval mass with partially circumscribed, partially obscured margins measuring approximately 1 cm at middle depth. Just anterior to this mass is a 0.6 cm oval circumscribed mass with central lucency, suggestive fatty. In the retroareolar right breast, there is a 0.6 cm mass versus ectatic duct. No suspicious calcifications in the right breast. Targeted ultrasound of the retroareolar right breast at the 3 o'clock position demonstrates and anechoic ectatic duct containing a 0.2 x 0.2 x 0.2 cm hypoechoic possible intraductal mass. No internal vascularity is demonstrated. Targeted ultrasound of the right breast at the 9 o'clock position 10 cm from the nipple demonstrates a 0.6 x 0.4 x 0.4 cm oval, slightly lobulated circumscribed mass, corresponding to the 0.6 cm mass in the outer central right breast at middle depth. Targeted ultrasound the right breast at the 9 o'clock position 12 cm from the nipple demonstrates a 1.2 x 0.7 x 0.7 cm irregular hypoechoic mass with angular margins, corresponding to the partially obscured mass in the outer central breast at middle depth. Targeted ultrasound the right axilla demonstrates lymph nodes with normal morphology. LEFT  breast: The previously noted possible asymmetry in the central outer left breast at middle depth partially effaces on additional views, suggestive of overlapping fibroglandular tissue. No suspicious calcifications breast. Targeted ultrasound of the left breast at the 4 o'clock position 8 cm from the nipple demonstrates a 2.5 x 0.8 x 2.0 cm irregular hypoechoic mass versus coalescing ducts with hypoechoic debris versus masses. At the 4 o'clock position 7 cm from the nipple, there is a 0.5 x 0.3 x 0.3 cm oval hypoechoic mass with mild internal vascularity. Likely mammographic correlate on the craniocaudal view frame 65/75. Targeted ultrasound of the left axilla demonstrates lymph nodes normal morphology. IMPRESSION: RIGHT breast: 1. Suspicious 1.2 cm mass at the right breast at the 9 o'clock position. 2. Indeterminate 0.6 cm mass at the right breast at the 9 o'clock position. 3. Indeterminate intraductal mass versus debris at the retroareolar right breast 3 o'clock position. 4. No right axillary lymphadenopathy. LEFT breast: 1. Indeterminate mass versus coalescing ducts at the left breast 4 o'clock position. 2. Probably benign 0.5 cm mass at the left breast 4 o'clock position. 3. No left axillary lymphadenopathy. RECOMMENDATION: Right breast ultrasound-guided biopsy at 3 sites and left breast ultrasound-guided biopsy at 1 site. I have discussed the findings and recommendations with the patient. The biopsy procedures were discussed with the patient and questions were answered. Patient expressed their understanding of the biopsy recommendation. Ordering provider will be notified regarding the imaging findings and recommendations. Once orders for the biopsies are received, patient will be scheduled to contact the procedure at her earliest convenience. BI-RADS CATEGORY  4: Suspicious. Electronically Signed   By: Mercie Stalker M.D.   On: 02/13/2024 16:29   US  LIMITED ULTRASOUND INCLUDING AXILLA LEFT BREAST  Result Date:  02/13/2024 CLINICAL DATA:  Patient was recalled for a mass in the right breast and asymmetry in the left breast new baseline screening mammogram performed July 2024; delayed initial evaluation. Patient also reports spontaneous dark brown right nipple discharge for a few weeks. EXAM: DIGITAL DIAGNOSTIC BILATERAL MAMMOGRAM WITH TOMOSYNTHESIS AND CAD; ULTRASOUND RIGHT BREAST LIMITED; ULTRASOUND LEFT BREAST LIMITED TECHNIQUE: Bilateral digital diagnostic mammography and breast tomosynthesis was performed. The images were evaluated with computer-aided detection. ; Targeted ultrasound examination of the right breast was performed; Targeted ultrasound examination of the left breast was performed. COMPARISON:  Screening mammogram 05/09/2023 ACR Breast Density  Category b: There are scattered areas of fibroglandular density. FINDINGS: RIGHT breast: The previously noted possible mass in the outer central right breast persists on additional views as a oval mass with partially circumscribed, partially obscured margins measuring approximately 1 cm at middle depth. Just anterior to this mass is a 0.6 cm oval circumscribed mass with central lucency, suggestive fatty. In the retroareolar right breast, there is a 0.6 cm mass versus ectatic duct. No suspicious calcifications in the right breast. Targeted ultrasound of the retroareolar right breast at the 3 o'clock position demonstrates and anechoic ectatic duct containing a 0.2 x 0.2 x 0.2 cm hypoechoic possible intraductal mass. No internal vascularity is demonstrated. Targeted ultrasound of the right breast at the 9 o'clock position 10 cm from the nipple demonstrates a 0.6 x 0.4 x 0.4 cm oval, slightly lobulated circumscribed mass, corresponding to the 0.6 cm mass in the outer central right breast at middle depth. Targeted ultrasound the right breast at the 9 o'clock position 12 cm from the nipple demonstrates a 1.2 x 0.7 x 0.7 cm irregular hypoechoic mass with angular margins,  corresponding to the partially obscured mass in the outer central breast at middle depth. Targeted ultrasound the right axilla demonstrates lymph nodes with normal morphology. LEFT breast: The previously noted possible asymmetry in the central outer left breast at middle depth partially effaces on additional views, suggestive of overlapping fibroglandular tissue. No suspicious calcifications breast. Targeted ultrasound of the left breast at the 4 o'clock position 8 cm from the nipple demonstrates a 2.5 x 0.8 x 2.0 cm irregular hypoechoic mass versus coalescing ducts with hypoechoic debris versus masses. At the 4 o'clock position 7 cm from the nipple, there is a 0.5 x 0.3 x 0.3 cm oval hypoechoic mass with mild internal vascularity. Likely mammographic correlate on the craniocaudal view frame 65/75. Targeted ultrasound of the left axilla demonstrates lymph nodes normal morphology. IMPRESSION: RIGHT breast: 1. Suspicious 1.2 cm mass at the right breast at the 9 o'clock position. 2. Indeterminate 0.6 cm mass at the right breast at the 9 o'clock position. 3. Indeterminate intraductal mass versus debris at the retroareolar right breast 3 o'clock position. 4. No right axillary lymphadenopathy. LEFT breast: 1. Indeterminate mass versus coalescing ducts at the left breast 4 o'clock position. 2. Probably benign 0.5 cm mass at the left breast 4 o'clock position. 3. No left axillary lymphadenopathy. RECOMMENDATION: Right breast ultrasound-guided biopsy at 3 sites and left breast ultrasound-guided biopsy at 1 site. I have discussed the findings and recommendations with the patient. The biopsy procedures were discussed with the patient and questions were answered. Patient expressed their understanding of the biopsy recommendation. Ordering provider will be notified regarding the imaging findings and recommendations. Once orders for the biopsies are received, patient will be scheduled to contact the procedure at her earliest  convenience. BI-RADS CATEGORY  4: Suspicious. Electronically Signed   By: Mercie Stalker M.D.   On: 02/13/2024 16:29   US  LIMITED ULTRASOUND INCLUDING AXILLA RIGHT BREAST Result Date: 02/13/2024 CLINICAL DATA:  Patient was recalled for a mass in the right breast and asymmetry in the left breast new baseline screening mammogram performed July 2024; delayed initial evaluation. Patient also reports spontaneous dark brown right nipple discharge for a few weeks. EXAM: DIGITAL DIAGNOSTIC BILATERAL MAMMOGRAM WITH TOMOSYNTHESIS AND CAD; ULTRASOUND RIGHT BREAST LIMITED; ULTRASOUND LEFT BREAST LIMITED TECHNIQUE: Bilateral digital diagnostic mammography and breast tomosynthesis was performed. The images were evaluated with computer-aided detection. ; Targeted ultrasound examination of the right  breast was performed; Targeted ultrasound examination of the left breast was performed. COMPARISON:  Screening mammogram 05/09/2023 ACR Breast Density Category b: There are scattered areas of fibroglandular density. FINDINGS: RIGHT breast: The previously noted possible mass in the outer central right breast persists on additional views as a oval mass with partially circumscribed, partially obscured margins measuring approximately 1 cm at middle depth. Just anterior to this mass is a 0.6 cm oval circumscribed mass with central lucency, suggestive fatty. In the retroareolar right breast, there is a 0.6 cm mass versus ectatic duct. No suspicious calcifications in the right breast. Targeted ultrasound of the retroareolar right breast at the 3 o'clock position demonstrates and anechoic ectatic duct containing a 0.2 x 0.2 x 0.2 cm hypoechoic possible intraductal mass. No internal vascularity is demonstrated. Targeted ultrasound of the right breast at the 9 o'clock position 10 cm from the nipple demonstrates a 0.6 x 0.4 x 0.4 cm oval, slightly lobulated circumscribed mass, corresponding to the 0.6 cm mass in the outer central right breast at  middle depth. Targeted ultrasound the right breast at the 9 o'clock position 12 cm from the nipple demonstrates a 1.2 x 0.7 x 0.7 cm irregular hypoechoic mass with angular margins, corresponding to the partially obscured mass in the outer central breast at middle depth. Targeted ultrasound the right axilla demonstrates lymph nodes with normal morphology. LEFT breast: The previously noted possible asymmetry in the central outer left breast at middle depth partially effaces on additional views, suggestive of overlapping fibroglandular tissue. No suspicious calcifications breast. Targeted ultrasound of the left breast at the 4 o'clock position 8 cm from the nipple demonstrates a 2.5 x 0.8 x 2.0 cm irregular hypoechoic mass versus coalescing ducts with hypoechoic debris versus masses. At the 4 o'clock position 7 cm from the nipple, there is a 0.5 x 0.3 x 0.3 cm oval hypoechoic mass with mild internal vascularity. Likely mammographic correlate on the craniocaudal view frame 65/75. Targeted ultrasound of the left axilla demonstrates lymph nodes normal morphology. IMPRESSION: RIGHT breast: 1. Suspicious 1.2 cm mass at the right breast at the 9 o'clock position. 2. Indeterminate 0.6 cm mass at the right breast at the 9 o'clock position. 3. Indeterminate intraductal mass versus debris at the retroareolar right breast 3 o'clock position. 4. No right axillary lymphadenopathy. LEFT breast: 1. Indeterminate mass versus coalescing ducts at the left breast 4 o'clock position. 2. Probably benign 0.5 cm mass at the left breast 4 o'clock position. 3. No left axillary lymphadenopathy. RECOMMENDATION: Right breast ultrasound-guided biopsy at 3 sites and left breast ultrasound-guided biopsy at 1 site. I have discussed the findings and recommendations with the patient. The biopsy procedures were discussed with the patient and questions were answered. Patient expressed their understanding of the biopsy recommendation. Ordering provider  will be notified regarding the imaging findings and recommendations. Once orders for the biopsies are received, patient will be scheduled to contact the procedure at her earliest convenience. BI-RADS CATEGORY  4: Suspicious. Electronically Signed   By: Mercie Stalker M.D.   On: 02/13/2024 16:29

## 2024-03-10 NOTE — Assessment & Plan Note (Addendum)
 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  Adjuvant RT and endocrine therapy.   Right  breast papillary neoplasm ER/PR+.HER2 -, left breast papillary neoplasm ER/PR+   Recommend surgical resection.   Patient is reluctant to establish care with surgery. She prefers to get a 2nd opinion. Will refer to [facility].   Addendum: her Bilateral breast MRI w wo contrast showed additional non mass enhancement areas in both breasts. Recommend MRI biopsy, especially if she desires breast conservative surgery.

## 2024-03-10 NOTE — Progress Notes (Signed)
 Patient has noticed a little decrease in her appetite lately. She is going through some emotional issues with her family.

## 2024-03-11 ENCOUNTER — Encounter: Payer: Self-pay | Admitting: *Deleted

## 2024-03-11 NOTE — Addendum Note (Signed)
 Addended by: Waverly Hageman on: 03/11/2024 10:24 AM   Modules accepted: Orders

## 2024-03-11 NOTE — Progress Notes (Signed)
 [name] called and would like the referral to [facility] canceled as she will be going to [place]  for her 2nd opinion.   [facility] called and they will cancel the referral.   She also asked if Dr. Wilhelmenia Harada would put her on the estrogen blocking medication since it will be at least a month before she can go to [place]  to establish care.   Dr. Wilhelmenia Harada did not feel it was necessary for her start on an AI since it is only a month away.   Vanessa Hogan understood and will be transferring care to [place]  in June.

## 2024-03-20 ENCOUNTER — Ambulatory Visit
Admission: RE | Admit: 2024-03-20 | Discharge: 2024-03-20 | Disposition: A | Discharge status: Home / Self Care | Source: Ambulatory Visit | Attending: Oncology | Admitting: Oncology

## 2024-03-20 DIAGNOSIS — C50919 Malignant neoplasm of unspecified site of unspecified female breast: Secondary | ICD-10-CM | POA: Diagnosis present

## 2024-03-20 MED ORDER — GADOBUTROL 1 MMOL/ML IV SOLN
9.0000 mL | Freq: Once | INTRAVENOUS | Status: AC | PRN
  Administered 2024-03-20: 9 mL via INTRAVENOUS

## 2024-03-21 ENCOUNTER — Other Ambulatory Visit: Payer: Self-pay | Admitting: Oncology

## 2024-03-21 ENCOUNTER — Encounter: Payer: Self-pay | Admitting: *Deleted

## 2024-03-21 DIAGNOSIS — R928 Other abnormal and inconclusive findings on diagnostic imaging of breast: Secondary | ICD-10-CM

## 2024-03-21 DIAGNOSIS — C50919 Malignant neoplasm of unspecified site of unspecified female breast: Secondary | ICD-10-CM

## 2024-03-21 NOTE — Progress Notes (Signed)
 MRI biopsies recommended for bil breasts.   Vanessa Hogan is willing to proceed, orders placed.  [name] will call her to set up the appt.

## 2024-03-31 ENCOUNTER — Encounter

## 2024-04-03 ENCOUNTER — Other Ambulatory Visit

## 2024-04-03 ENCOUNTER — Ambulatory Visit: Payer: Medicare Other | Admitting: Family Medicine

## 2024-04-03 ENCOUNTER — Encounter

## 2024-04-03 ENCOUNTER — Ambulatory Visit: Payer: Self-pay | Admitting: Oncology

## 2024-04-04 ENCOUNTER — Telehealth: Payer: Self-pay | Admitting: *Deleted

## 2024-04-04 ENCOUNTER — Encounter: Payer: Self-pay | Admitting: *Deleted

## 2024-04-04 NOTE — Telephone Encounter (Signed)
-----   Message from Timmy Forbes sent at 04/03/2024 10:48 PM EDT ----- Please arrange pt to have another discussion with me with MRI results.  ----- Message ----- From: Interface, Rad Results In Sent: 03/20/2024   1:31 PM EDT To: Timmy Forbes, MD

## 2024-04-04 NOTE — Telephone Encounter (Signed)
 Vanessa Hogan cancelled her MRI biopsies scheduled for yesterday.   I spoke with her and she has decided she is in fact moving to [place]  on 6/19 to be closer to her family and will pursue treatment there.  She will call if anything changes and needs to have appointments scheduled here.

## 2024-04-04 NOTE — Telephone Encounter (Signed)
 Vanessa Hogan can you please arrange for patient to see Dr. Wilhelmenia Harada for MRI results.

## 2024-04-04 NOTE — Progress Notes (Signed)
 Vanessa Hogan is moving to [place]  on 6/19 and will pursue treatment there.  Nothing further needed at this time.

## 2024-04-07 ENCOUNTER — Other Ambulatory Visit

## 2024-04-07 ENCOUNTER — Encounter

## 2024-04-09 ENCOUNTER — Inpatient Hospital Stay: Attending: Oncology

## 2024-04-16 ENCOUNTER — Telehealth: Payer: Self-pay

## 2024-04-16 NOTE — Telephone Encounter (Signed)
 Copied from CRM [id number]. Topic: MyChart - Other >> Apr 16, 2024  9:50 AM Aleatha C wrote: Reason for CRM: Patient has moved to [place]  and is canceling all care with [facility]

## 2024-04-23 ENCOUNTER — Ambulatory Visit: Admitting: Family Medicine

## 2024-04-23 NOTE — Telephone Encounter (Addendum)
 Diagnosis Right breast cancer :[facility] [place] at  [facility] When were you diagnosed:02/27/24 Where were you diagnosed:[place]  Reason for visit: surgery/treatment Breast Imaging: mammo,breast mri 03/07/24 Biopsy: Right breast cancer Breast symptoms: discharge from right breast and set up diag mammo History of breast biopsies: none Last mammogram prior to this event: yearly due 04/2024 Prior breast cancer history: none GYN history: NA   Referring physician:self [facility] Screening: not at this time Past Medical History:HTM, diabetes acid refulx Family history of cancer dad Stomach ca Genetic testing: none Fertility: none Mri of breast  03/07/24 at [place] Previous mammo  [facility]  R breast biopsy  9mm  venues invasative cardcinmia w mouous features.  Lympovascualr not Calc not indentified   Left breast core biopsy      Patient has appt w Dr. Purvis 7/14 at 1000 am  Education provided:  Barriers identified:  Referrals made:  Resources provided:

## 2024-05-13 ENCOUNTER — Encounter: Payer: Self-pay | Admitting: Oncology

## 2024-07-15 ENCOUNTER — Telehealth: Payer: Self-pay

## 2024-07-15 NOTE — Transitions of Care (Post Inpatient/ED Visit) (Signed)
   07/15/2024  Name: Vanessa Hogan MRN: [id number] DOB: 1941-07-24  Today's TOC FU Call Status:    Attempted to reach the patient regarding the most recent Inpatient/ED visit. Spoke with daughter Jon Edison listed on DPR and states patient is no longer in [place] and no longer has PCP in [place].   Follow Up Plan: No further outreach attempts will be made at this time. We have been unable to contact the patient.  Richerd Fish, RN, BSN, CCM [facility], [facility] RN Care Manager Direct Dial: [phone]

## 2024-07-25 ENCOUNTER — Telehealth: Payer: Self-pay | Admitting: Family Medicine

## 2024-07-25 ENCOUNTER — Encounter: Payer: Self-pay | Admitting: Physician Assistant

## 2024-07-25 NOTE — Telephone Encounter (Signed)
 Received Ozempic  for pt from pt assistance program.  Tried to call pt and was not able to get through due to the number on file not working.  Reached out to patient's daughter and she states that the patient does not live in  any longer and will not be able to get the medication.  She is now living in [place].

## 2024-08-19 ENCOUNTER — Other Ambulatory Visit (HOSPITAL_COMMUNITY): Payer: Self-pay

## 2024-09-01 ENCOUNTER — Telehealth: Payer: Self-pay

## 2024-09-01 NOTE — Transitions of Care (Post Inpatient/ED Visit) (Unsigned)
   09/01/2024  Name: Vanessa Hogan MRN: [id number] DOB: [birth year]  Today's TOC FU Call Status: Today's TOC FU Call Status:: Unsuccessful Call (1st Attempt) Unsuccessful Call (1st Attempt) Date: 09/01/24  Attempted to reach the patient regarding the most recent Inpatient/ED visit.  Follow Up Plan: Additional outreach attempts will be made to reach the patient to complete the Transitions of Care (Post Inpatient/ED visit) call.   Signature Julian Lemmings, LPN [facility] Nurse Health Advisor Direct Dial [phone]

## 2024-09-02 NOTE — Transitions of Care (Post Inpatient/ED Visit) (Signed)
   09/02/2024  Name: Vanessa Hogan MRN: [id number] DOB: 05-Nov-1940  Today's TOC FU Call Status: Today's TOC FU Call Status:: Successful TOC FU Call Completed Unsuccessful Call (1st Attempt) Date: 09/01/24 [facility] FU Call Complete Date: 09/02/24  Patient's Name and Date of Birth confirmed. Name, DOB  Transition Care Management Follow-up Telephone Call Date of Discharge: 08/29/24  Items Reviewed:    Medications Reviewed Today: Medications Reviewed Today   Medications were not reviewed in this encounter     Home Care and Equipment/Supplies:    Functional Questionnaire:    Follow up appointments reviewed: PCP Follow-up appointment confirmed?: No (moved to [place])    SIGNATURE Julian Lemmings, LPN [facility] Nurse Health Advisor Direct Dial [phone]

## 2024-11-12 ENCOUNTER — Telehealth: Payer: Self-pay

## 2024-11-12 NOTE — Telephone Encounter (Signed)
 I attempted to contact Vanessa Hogan to inform her to stop by the office before 11/14/24 due to the potential inclement weather and no generator on site. She unfortunately does not have a voicemail box set up for me to leave her a message.
